# Patient Record
Sex: Female | Born: 1935 | Race: White | Hispanic: No | Smoking: Never smoker
Health system: Southern US, Community
[De-identification: ages and names within clinical notes are randomized; demographics above are authoritative.]

## PROBLEM LIST (undated history)

## (undated) DIAGNOSIS — F329 Major depressive disorder, single episode, unspecified: Secondary | ICD-10-CM

## (undated) DIAGNOSIS — I619 Nontraumatic intracerebral hemorrhage, unspecified: Secondary | ICD-10-CM

## (undated) DIAGNOSIS — I10 Essential (primary) hypertension: Secondary | ICD-10-CM

## (undated) DIAGNOSIS — I669 Occlusion and stenosis of unspecified cerebral artery: Secondary | ICD-10-CM

## (undated) DIAGNOSIS — I2510 Atherosclerotic heart disease of native coronary artery without angina pectoris: Secondary | ICD-10-CM

## (undated) DIAGNOSIS — F419 Anxiety disorder, unspecified: Secondary | ICD-10-CM

## (undated) DIAGNOSIS — F32A Depression, unspecified: Secondary | ICD-10-CM

## (undated) DIAGNOSIS — I639 Cerebral infarction, unspecified: Secondary | ICD-10-CM

## (undated) DIAGNOSIS — I340 Nonrheumatic mitral (valve) insufficiency: Secondary | ICD-10-CM

## (undated) HISTORY — DX: Cerebral infarction, unspecified: I63.9

## (undated) HISTORY — DX: Major depressive disorder, single episode, unspecified: F32.9

## (undated) HISTORY — DX: Occlusion and stenosis of unspecified cerebral artery: I66.9

## (undated) HISTORY — PX: ABDOMINAL HYSTERECTOMY: SHX81

## (undated) HISTORY — PX: APPENDECTOMY: SHX54

## (undated) HISTORY — PX: CHOLECYSTECTOMY: SHX55

## (undated) HISTORY — PX: COLON SURGERY: SHX602

## (undated) HISTORY — DX: Nontraumatic intracerebral hemorrhage, unspecified: I61.9

## (undated) HISTORY — DX: Anxiety disorder, unspecified: F41.9

## (undated) HISTORY — DX: Depression, unspecified: F32.A

## (undated) HISTORY — DX: Essential (primary) hypertension: I10

---

## 1999-07-01 DIAGNOSIS — I639 Cerebral infarction, unspecified: Secondary | ICD-10-CM

## 1999-07-01 HISTORY — DX: Cerebral infarction, unspecified: I63.9

## 2008-11-10 ENCOUNTER — Emergency Department: Payer: Self-pay | Admitting: Emergency Medicine

## 2010-12-22 ENCOUNTER — Emergency Department: Payer: Self-pay | Admitting: Internal Medicine

## 2014-10-23 ENCOUNTER — Emergency Department
Admit: 2014-10-23 | Disposition: A | Discharge status: Home / Self Care | Payer: Self-pay | Admitting: Emergency Medicine

## 2014-10-23 DIAGNOSIS — Z79899 Other long term (current) drug therapy: Secondary | ICD-10-CM | POA: Diagnosis not present

## 2014-10-23 DIAGNOSIS — K0500 Acute gingivitis, plaque induced: Secondary | ICD-10-CM | POA: Diagnosis not present

## 2014-10-23 DIAGNOSIS — K0510 Chronic gingivitis, plaque induced: Secondary | ICD-10-CM | POA: Diagnosis not present

## 2014-10-23 DIAGNOSIS — Z7982 Long term (current) use of aspirin: Secondary | ICD-10-CM | POA: Diagnosis not present

## 2014-10-23 DIAGNOSIS — R6884 Jaw pain: Secondary | ICD-10-CM | POA: Diagnosis not present

## 2014-10-23 DIAGNOSIS — I10 Essential (primary) hypertension: Secondary | ICD-10-CM | POA: Diagnosis not present

## 2014-10-23 DIAGNOSIS — K088 Other specified disorders of teeth and supporting structures: Secondary | ICD-10-CM | POA: Diagnosis not present

## 2014-10-23 DIAGNOSIS — K029 Dental caries, unspecified: Secondary | ICD-10-CM | POA: Diagnosis not present

## 2014-10-23 LAB — CBC WITH DIFFERENTIAL/PLATELET
BASOS ABS: 0 10*3/uL (ref 0.0–0.1)
Basophil %: 0.5 %
EOS ABS: 0 10*3/uL (ref 0.0–0.7)
Eosinophil %: 0.4 %
HCT: 41.5 % (ref 35.0–47.0)
HGB: 14.1 g/dL (ref 12.0–16.0)
Lymphocyte #: 3.1 10*3/uL (ref 1.0–3.6)
Lymphocyte %: 32.6 %
MCH: 31.8 pg (ref 26.0–34.0)
MCHC: 34.1 g/dL (ref 32.0–36.0)
MCV: 93 fL (ref 80–100)
Monocyte #: 0.7 x10 3/mm (ref 0.2–0.9)
Monocyte %: 7.2 %
NEUTROS ABS: 5.6 10*3/uL (ref 1.4–6.5)
NEUTROS PCT: 59.3 %
PLATELETS: 240 10*3/uL (ref 150–440)
RBC: 4.45 10*6/uL (ref 3.80–5.20)
RDW: 13.2 % (ref 11.5–14.5)
WBC: 9.4 10*3/uL (ref 3.6–11.0)

## 2014-11-24 DIAGNOSIS — I10 Essential (primary) hypertension: Secondary | ICD-10-CM | POA: Diagnosis not present

## 2014-11-24 DIAGNOSIS — I639 Cerebral infarction, unspecified: Secondary | ICD-10-CM | POA: Diagnosis not present

## 2015-07-29 ENCOUNTER — Other Ambulatory Visit: Payer: Self-pay | Admitting: Unknown Physician Specialty

## 2015-07-30 NOTE — Telephone Encounter (Signed)
Pt needs check further refills 

## 2015-08-15 ENCOUNTER — Other Ambulatory Visit: Payer: Self-pay | Admitting: Unknown Physician Specialty

## 2015-08-15 NOTE — Telephone Encounter (Signed)
Pt needs check further refills 

## 2015-08-21 NOTE — Telephone Encounter (Signed)
Called pt the number on file in [facility] has been disconnected, the number on file in [facility] works, left message on voicemail to call and schedule a follow up appt ASAP. Thanks.

## 2015-08-22 NOTE — Telephone Encounter (Signed)
Called the number from [facility] and left a voicemail asking for patient to please return my call.

## 2015-08-24 NOTE — Telephone Encounter (Signed)
Called and left patient a voicemail asking for her to please return my call. This was the 3rd attempt at reaching the patient so I will send her a letter.  

## 2015-10-28 ENCOUNTER — Other Ambulatory Visit: Payer: Self-pay | Admitting: Unknown Physician Specialty

## 2015-11-07 ENCOUNTER — Other Ambulatory Visit: Payer: Self-pay | Admitting: Unknown Physician Specialty

## 2015-11-07 MED ORDER — FLUOXETINE HCL 20 MG PO CAPS: 20.0000 mg | ORAL_CAPSULE | Freq: Every day | ORAL | Status: DC

## 2015-11-07 MED ORDER — HYDROCHLOROTHIAZIDE 25 MG PO TABS: 25.0000 mg | ORAL_TABLET | Freq: Every day | ORAL | Status: DC

## 2015-11-07 NOTE — Telephone Encounter (Signed)
Pt's daughter called stated her mother is completely out of medication and the pharmacy said pt needs an appt before pt can receive a refill. Pt has not been seen in almost a year. Daughter stated she has no way to get mother in for an appointment as her car is in the shop. Please contact pt's daughter. Thanks.

## 2015-11-07 NOTE — Telephone Encounter (Signed)
Called and spoke to patient's daughter. She stated that her mother was completely out of medications. I asked her if her car would be ready within the next month and she stated it should be so I scheduled the patient an appointment for 12/03/15. Elnita MaxwellCheryl, can we send in enough medicine to get to that appointment.

## 2015-11-07 NOTE — Telephone Encounter (Signed)
done

## 2015-11-08 DIAGNOSIS — F419 Anxiety disorder, unspecified: Secondary | ICD-10-CM | POA: Insufficient documentation

## 2015-11-08 DIAGNOSIS — F32A Depression, unspecified: Secondary | ICD-10-CM | POA: Insufficient documentation

## 2015-11-08 DIAGNOSIS — I10 Essential (primary) hypertension: Secondary | ICD-10-CM | POA: Insufficient documentation

## 2015-11-08 DIAGNOSIS — F329 Major depressive disorder, single episode, unspecified: Secondary | ICD-10-CM | POA: Insufficient documentation

## 2015-12-03 ENCOUNTER — Ambulatory Visit: Payer: Self-pay | Admitting: Unknown Physician Specialty

## 2016-01-06 ENCOUNTER — Other Ambulatory Visit: Payer: Self-pay | Admitting: Unknown Physician Specialty

## 2016-01-18 ENCOUNTER — Other Ambulatory Visit: Payer: Self-pay

## 2016-01-18 MED ORDER — FLUOXETINE HCL 20 MG PO CAPS: 20.0000 mg | ORAL_CAPSULE | Freq: Every day | ORAL | Status: DC

## 2016-01-18 MED ORDER — HYDROCHLOROTHIAZIDE 25 MG PO TABS: 25.0000 mg | ORAL_TABLET | Freq: Every day | ORAL | Status: DC

## 2016-03-07 ENCOUNTER — Emergency Department: Payer: Medicare Other

## 2016-03-07 ENCOUNTER — Inpatient Hospital Stay
Admission: EM | Admit: 2016-03-07 | Discharge: 2016-03-11 | DRG: 065 | Disposition: A | Discharge status: Skilled Nursing Facility | Payer: Medicare Other | Attending: Internal Medicine | Admitting: Internal Medicine

## 2016-03-07 DIAGNOSIS — I639 Cerebral infarction, unspecified: Principal | ICD-10-CM | POA: Diagnosis present

## 2016-03-07 DIAGNOSIS — Z7401 Bed confinement status: Secondary | ICD-10-CM | POA: Diagnosis not present

## 2016-03-07 DIAGNOSIS — Z23 Encounter for immunization: Secondary | ICD-10-CM | POA: Diagnosis not present

## 2016-03-07 DIAGNOSIS — I6602 Occlusion and stenosis of left middle cerebral artery: Secondary | ICD-10-CM | POA: Diagnosis not present

## 2016-03-07 DIAGNOSIS — B9620 Unspecified Escherichia coli [E. coli] as the cause of diseases classified elsewhere: Secondary | ICD-10-CM | POA: Diagnosis present

## 2016-03-07 DIAGNOSIS — I679 Cerebrovascular disease, unspecified: Secondary | ICD-10-CM | POA: Diagnosis not present

## 2016-03-07 DIAGNOSIS — I69351 Hemiplegia and hemiparesis following cerebral infarction affecting right dominant side: Secondary | ICD-10-CM | POA: Diagnosis not present

## 2016-03-07 DIAGNOSIS — M6281 Muscle weakness (generalized): Secondary | ICD-10-CM | POA: Diagnosis not present

## 2016-03-07 DIAGNOSIS — Z741 Need for assistance with personal care: Secondary | ICD-10-CM | POA: Diagnosis not present

## 2016-03-07 DIAGNOSIS — R262 Difficulty in walking, not elsewhere classified: Secondary | ICD-10-CM | POA: Diagnosis not present

## 2016-03-07 DIAGNOSIS — G8321 Monoplegia of upper limb affecting right dominant side: Secondary | ICD-10-CM | POA: Diagnosis present

## 2016-03-07 DIAGNOSIS — E785 Hyperlipidemia, unspecified: Secondary | ICD-10-CM | POA: Diagnosis present

## 2016-03-07 DIAGNOSIS — I340 Nonrheumatic mitral (valve) insufficiency: Secondary | ICD-10-CM | POA: Diagnosis not present

## 2016-03-07 DIAGNOSIS — I2510 Atherosclerotic heart disease of native coronary artery without angina pectoris: Secondary | ICD-10-CM | POA: Diagnosis not present

## 2016-03-07 DIAGNOSIS — N390 Urinary tract infection, site not specified: Secondary | ICD-10-CM | POA: Diagnosis not present

## 2016-03-07 DIAGNOSIS — E876 Hypokalemia: Secondary | ICD-10-CM | POA: Diagnosis not present

## 2016-03-07 DIAGNOSIS — Z7982 Long term (current) use of aspirin: Secondary | ICD-10-CM

## 2016-03-07 DIAGNOSIS — R29706 NIHSS score 6: Secondary | ICD-10-CM | POA: Diagnosis not present

## 2016-03-07 DIAGNOSIS — I63032 Cerebral infarction due to thrombosis of left carotid artery: Secondary | ICD-10-CM | POA: Diagnosis not present

## 2016-03-07 DIAGNOSIS — I6350 Cerebral infarction due to unspecified occlusion or stenosis of unspecified cerebral artery: Secondary | ICD-10-CM | POA: Diagnosis not present

## 2016-03-07 DIAGNOSIS — Z8249 Family history of ischemic heart disease and other diseases of the circulatory system: Secondary | ICD-10-CM

## 2016-03-07 DIAGNOSIS — R1312 Dysphagia, oropharyngeal phase: Secondary | ICD-10-CM | POA: Diagnosis not present

## 2016-03-07 DIAGNOSIS — R279 Unspecified lack of coordination: Secondary | ICD-10-CM | POA: Diagnosis not present

## 2016-03-07 DIAGNOSIS — R4701 Aphasia: Secondary | ICD-10-CM | POA: Diagnosis not present

## 2016-03-07 DIAGNOSIS — I10 Essential (primary) hypertension: Secondary | ICD-10-CM | POA: Diagnosis not present

## 2016-03-07 DIAGNOSIS — I63031 Cerebral infarction due to thrombosis of right carotid artery: Secondary | ICD-10-CM | POA: Diagnosis not present

## 2016-03-07 DIAGNOSIS — R29818 Other symptoms and signs involving the nervous system: Secondary | ICD-10-CM | POA: Diagnosis not present

## 2016-03-07 DIAGNOSIS — Z823 Family history of stroke: Secondary | ICD-10-CM | POA: Diagnosis not present

## 2016-03-07 DIAGNOSIS — R471 Dysarthria and anarthria: Secondary | ICD-10-CM | POA: Diagnosis not present

## 2016-03-07 DIAGNOSIS — R531 Weakness: Secondary | ICD-10-CM | POA: Diagnosis not present

## 2016-03-07 HISTORY — DX: Nonrheumatic mitral (valve) insufficiency: I34.0

## 2016-03-07 LAB — CBC
HEMATOCRIT: 41.6 % (ref 35.0–47.0)
HEMOGLOBIN: 14.6 g/dL (ref 12.0–16.0)
MCH: 31.8 pg (ref 26.0–34.0)
MCHC: 35.1 g/dL (ref 32.0–36.0)
MCV: 90.6 fL (ref 80.0–100.0)
Platelets: 246 10*3/uL (ref 150–440)
RBC: 4.59 MIL/uL (ref 3.80–5.20)
RDW: 13.2 % (ref 11.5–14.5)
WBC: 10.6 10*3/uL (ref 3.6–11.0)

## 2016-03-07 LAB — COMPREHENSIVE METABOLIC PANEL
ALK PHOS: 73 U/L (ref 38–126)
ALT: 25 U/L (ref 14–54)
AST: 33 U/L (ref 15–41)
Albumin: 4.3 g/dL (ref 3.5–5.0)
Anion gap: 11 (ref 5–15)
BILIRUBIN TOTAL: 1.1 mg/dL (ref 0.3–1.2)
BUN: 19 mg/dL (ref 6–20)
CALCIUM: 9.3 mg/dL (ref 8.9–10.3)
CO2: 27 mmol/L (ref 22–32)
CREATININE: 0.78 mg/dL (ref 0.44–1.00)
Chloride: 102 mmol/L (ref 101–111)
GFR calc Af Amer: >60 mL/min (ref >60)
Glucose, Bld: 101 mg/dL — ABNORMAL HIGH (ref 65–99)
Potassium: 3.1 mmol/L — ABNORMAL LOW (ref 3.5–5.1)
Sodium: 140 mmol/L (ref 135–145)
TOTAL PROTEIN: 7.7 g/dL (ref 6.5–8.1)

## 2016-03-07 LAB — URINALYSIS COMPLETE WITH MICROSCOPIC (ARMC ONLY)
Bilirubin Urine: NEGATIVE
Glucose, UA: NEGATIVE mg/dL
Hgb urine dipstick: NEGATIVE
Nitrite: NEGATIVE
PROTEIN: NEGATIVE mg/dL
Specific Gravity, Urine: 1.015 (ref 1.005–1.030)
Squamous Epithelial / LPF: NONE SEEN
pH: 6 (ref 5.0–8.0)

## 2016-03-07 LAB — PROTIME-INR
INR: 0.98
Prothrombin Time: 13 seconds (ref 11.4–15.2)

## 2016-03-07 LAB — DIFFERENTIAL
BASOS ABS: 0.1 10*3/uL (ref 0–0.1)
Basophils Relative: 1 %
EOS ABS: 0.1 10*3/uL (ref 0–0.7)
Eosinophils Relative: 1 %
LYMPHS ABS: 4.8 10*3/uL — AB (ref 1.0–3.6)
Lymphocytes Relative: 45 %
MONOS PCT: 6 %
Monocytes Absolute: 0.6 10*3/uL (ref 0.2–0.9)
NEUTROS ABS: 5.1 10*3/uL (ref 1.4–6.5)
Neutrophils Relative %: 47 %

## 2016-03-07 LAB — GLUCOSE, CAPILLARY: Glucose-Capillary: 104 mg/dL — ABNORMAL HIGH (ref 65–99)

## 2016-03-07 LAB — APTT: APTT: 33 s (ref 24–36)

## 2016-03-07 LAB — TROPONIN I

## 2016-03-07 MED ORDER — POTASSIUM CHLORIDE 20 MEQ PO PACK
20.0000 meq | PACK | Freq: Once | ORAL | Status: AC
  Administered 2016-03-09: 10:00:00 20 meq via ORAL
  Filled 2016-03-07 (×2): qty 1

## 2016-03-07 MED ORDER — CLOPIDOGREL BISULFATE 75 MG PO TABS
75.0000 mg | ORAL_TABLET | Freq: Every day | ORAL | Status: DC
  Administered 2016-03-07 – 2016-03-11 (×5): 75 mg via ORAL
  Filled 2016-03-07 (×4): qty 1

## 2016-03-07 MED ORDER — CLOPIDOGREL BISULFATE 75 MG PO TABS
ORAL_TABLET | ORAL | Status: AC
  Filled 2016-03-07: qty 1

## 2016-03-07 MED ORDER — ATORVASTATIN CALCIUM 20 MG PO TABS
40.0000 mg | ORAL_TABLET | Freq: Every day | ORAL | Status: DC
  Administered 2016-03-08 – 2016-03-10 (×3): 40 mg via ORAL
  Filled 2016-03-07 (×3): qty 2

## 2016-03-07 MED ORDER — ACETAMINOPHEN 650 MG RE SUPP: 650.0000 mg | Freq: Four times a day (QID) | RECTAL | Status: DC | PRN

## 2016-03-07 MED ORDER — FLUOXETINE HCL 20 MG PO CAPS
20.0000 mg | ORAL_CAPSULE | Freq: Every day | ORAL | Status: DC
Start: 2016-03-08 — End: 2016-03-11
  Administered 2016-03-08 – 2016-03-11 (×4): 20 mg via ORAL
  Filled 2016-03-07 (×4): qty 1

## 2016-03-07 MED ORDER — ASPIRIN 81 MG PO CHEW
324.0000 mg | CHEWABLE_TABLET | Freq: Once | ORAL | Status: AC
  Administered 2016-03-07: 324 mg via ORAL
  Filled 2016-03-07: qty 4

## 2016-03-07 MED ORDER — ACETAMINOPHEN 325 MG PO TABS: 650.0000 mg | ORAL_TABLET | Freq: Four times a day (QID) | ORAL | Status: DC | PRN

## 2016-03-07 MED ORDER — ENOXAPARIN SODIUM 40 MG/0.4ML ~~LOC~~ SOLN
40.0000 mg | SUBCUTANEOUS | Status: DC
  Administered 2016-03-08 – 2016-03-10 (×4): 40 mg via SUBCUTANEOUS
  Filled 2016-03-07 (×4): qty 0.4

## 2016-03-07 NOTE — ED Triage Notes (Signed)
Pt came to ED via pov. Per family, pt last known well around 1030. Pt napped and woke up feeling weak. Pt has right sided deficits from previous stroke. Per family, right side is worse today. Pt having trouble walking and has slurred speech. No facial droop noted.

## 2016-03-07 NOTE — ED Notes (Signed)
CBG 104 

## 2016-03-07 NOTE — Consult Note (Signed)
Chaplain paged for Code Stroke; provide pastoral presence and support to patient, daughter while awaiting testing and information on POC.

## 2016-03-07 NOTE — H&P (Signed)
[facility] [place] [facility]   PATIENT NAME: Alexandria Hanson    MR#:  [id number]  DATE OF BIRTH:  [birth year]  DATE OF ADMISSION:  03/07/2016  PRIMARY CARE PHYSICIAN: Gabriel Cirriheryl Wicker, NP   REQUESTING/REFERRING PHYSICIAN:   CHIEF COMPLAINT:   Chief Complaint  Patient presents with  . Code Stroke    HISTORY OF PRESENT ILLNESS:  Alexandria Hanson  is a 82 y.o. female. At 10:30 AM patient had a fall. Later when they checked at her she had no mobility in her right arm and her speech was slurred. She knows what she wants to say but unable to say it. Last week she did have a headache for 3 days. Code stroke was called. A she was not in the TPA window. She does take aspirin at home. Hospitalists were contacted for admission. Patient has a history of stroke in the past with baseline right-sided weakness. Patient unable to talk in complete sentences clearly. She is able to say yes or no. History obtained from family at the bedside.  PAST MEDICAL HISTORY:   Past Medical History:  Diagnosis Date  . MI (mitral incompetence)   . Stroke [facility](HCC)     PAST SURGICAL HISTORY:   Past Surgical History:  Procedure Laterality Date  . ABDOMINAL HYSTERECTOMY    . APPENDECTOMY    . CHOLECYSTECTOMY      SOCIAL HISTORY:   Social History  Substance Use Topics  . Smoking status: Never Smoker  . Smokeless tobacco: Never Used  . Alcohol use No    FAMILY HISTORY:   Family History  Problem Relation Age of Onset  . CVA Mother   . CAD Father   . CAD Sister     DRUG ALLERGIES:  No Known Allergies  REVIEW OF SYSTEMS:  CONSTITUTIONAL: No fever, Positive for right-sided weakness  EYES: No blurred or double vision.  EARS, NOSE, AND THROAT: No tinnitus or ear pain. No sore throat RESPIRATORY: No cough, shortness of breath, wheezing or hemoptysis.  CARDIOVASCULAR: No chest pain, orthopnea, edema.  GASTROINTESTINAL: No nausea, vomiting, diarrhea or abdominal pain. No blood  in bowel movements GENITOURINARY: No dysuria, hematuria.  ENDOCRINE: No polyuria, nocturia,  HEMATOLOGY: No anemia, easy bruising or bleeding SKIN: No rash or lesion. MUSCULOSKELETAL: No joint pain or arthritis.   NEUROLOGIC: No tingling, numbness, weakness.  PSYCHIATRY: No anxiety or depression.   MEDICATIONS AT HOME:   Prior to Admission medications   Medication Sig Start Date End Date Taking? Authorizing Provider  aspirin 81 MG chewable tablet Chew 81 mg by mouth daily.   Yes Historical Provider, MD  FLUoxetine (PROZAC) 20 MG capsule Take 1 capsule by mouth daily. 02/15/16  Yes Historical Provider, MD  hydrochlorothiazide (HYDRODIURIL) 25 MG tablet Take 1 tablet by mouth daily. 02/15/16  Yes Historical Provider, MD  Multiple Vitamin (MULTIVITAMIN) tablet Take 1 tablet by mouth daily.   Yes Historical Provider, MD      VITAL SIGNS:  Blood pressure (!) 151/63, pulse 81, temperature 98.7 F (37.1 C), temperature source Oral, resp. rate 19, height 5\' 5"  (1.651 m), weight 54.4 kg (120 lb), SpO2 97 %.  PHYSICAL EXAMINATION:  GENERAL:  [age] y.o.-year-old patient lying in the bed with no acute distress.  EYES: Pupils equal, round, reactive to light and accommodation. No scleral icterus. Extraocular muscles intact.  HEENT: Head atraumatic, normocephalic. Oropharynx and nasopharynx clear.  NECK:  Supple, no jugular venous distention. No thyroid enlargement, no tenderness.  LUNGS: Normal breath sounds  bilaterally, no wheezing, rales,rhonchi or crepitation. No use of accessory muscles of respiration.  CARDIOVASCULAR: S1, S2 normal. No murmurs, rubs, or gallops.  ABDOMEN: Soft, nontender, nondistended. Bowel sounds present. No organomegaly or mass.  EXTREMITIES: No pedal edema, cyanosis, or clubbing.  NEUROLOGIC: Cranial nerves II through XII are intact. Patient only able to extend fingers on the right side. Slight flexion out of the elbow. Able to straight leg raise bilateral lower extremities. A  little weaker on the right lower extremity than the left. PSYCHIATRIC: The patient is alert.  SKIN: No rash, lesion, or ulcer.   LABORATORY PANEL:   CBC  Recent Labs Lab 03/07/16 1810  WBC 10.6  HGB 14.6  HCT 41.6  PLT 246   ------------------------------------------------------------------------------------------------------------------  Chemistries   Recent Labs Lab 03/07/16 1810  NA 140  K 3.1*  CL 102  CO2 27  GLUCOSE 101*  BUN 19  CREATININE 0.78  CALCIUM 9.3  AST 33  ALT 25  ALKPHOS 73  BILITOT 1.1   ------------------------------------------------------------------------------------------------------------------  Cardiac Enzymes  Recent Labs Lab 03/07/16 1810  TROPONINI <0.03   ------------------------------------------------------------------------------------------------------------------  RADIOLOGY:  Ct Head Code Stroke W/o Cm  Result Date: 03/07/2016 CLINICAL DATA:  Code stroke. Acute onset RIGHT-sided weakness and speech difficulty. History of LEFT subdural hematoma. EXAM: CT HEAD WITHOUT CONTRAST TECHNIQUE: Contiguous axial images were obtained from the base of the skull through the vertex without intravenous contrast. COMPARISON:  CT HEAD December 22, 2010 FINDINGS: BRAIN: No intraparenchymal hemorrhage, mass effect, midline shift or acute large vascular territory infarct. Old LEFT basal ganglia infarct with ex vacuo dilatation LEFT lateral ventricle. Smaller LEFT cerebral peduncle compatible with wallerian degeneration. Old small RIGHT cerebellar infarcts. Stable LEFT posterior fossa arachnoid cyst with mild mass effect. Resolution of LEFT subdural hemorrhage. VASCULAR: Moderate to severe calcific atherosclerosis of the carotid siphons. SKULL: No skull fracture. No significant scalp soft tissue swelling. Hyperostosis frontalis internus. Multiple calcified scalp sebaceous cysts. SINUSES/ORBITS: The included ocular globes and orbital contents are  non-suspicious.The mastoid air-cells and included paranasal sinuses are well-aerated. OTHER: None. ASPECTS [facility] Stroke Program Early CT Score) - Ganglionic level infarction (caudate, lentiform nuclei, internal capsule, insula, M1-M3 cortex): 7 - Supraganglionic infarction (M4-M6 cortex): 3 Total score (0-10 with 10 being normal): 10 IMPRESSION: 1. No acute intracranial process. Old LEFT basal ganglia infarct and moderate chronic small vessel ischemic disease. 2. ASPECTS is 10. Acute findings discussed with and reconfirmed by Dr.PHILLIP STAFFORD on 03/07/2016 at 6:24 pm. Electronically Signed   By: Awilda Metro M.D.   On: 03/07/2016 18:26    EKG:   Normal sinus rhythm right bundle branch block, left anterior fascicular block, LVH  IMPRESSION AND PLAN:   1. Acute stroke with right-sided weakness. Take a step up with Plavix. Add statin. Check a lipid profile in the a.m. Physical therapy, occupational therapy and speech therapy consultations. 2. Essential hypertension hold hydrochlorothiazide for right now. 3. History of CAD 4. Hypokalemia replace potassium orally   All the records are reviewed and case discussed with ED provider. Management plans discussed with the patient, family and they are in agreement.  CODE STATUS: DO NOT RESUSCITATE  TOTAL TIME TAKING CARE OF THIS PATIENT: 50 minutes.    Alford Highland M.D on 03/07/2016 at 9:40 PM  Between 7am to 6pm - Pager - [phone]  After 6pm call admission pager [phone]  [facility] Office  [phone]  CC: Primary care physician; Gabriel Cirri, NP

## 2016-03-07 NOTE — ED Notes (Signed)
CODE STROKE CALLED TO [phone] 

## 2016-03-07 NOTE — ED Provider Notes (Signed)
[facility] Emergency Department Provider Note  ____________________________________________  Time seen: Approximately 7:27 PM  I have reviewed the triage vital signs and the nursing notes.   HISTORY  Chief Complaint Code Stroke   HPI Alexandria Hanson is a 82 y.o. female history of CVA on aspirin who presents for evaluation of expressive aphasia and worsening right-sided weakness. Patient was made a code stroke upon arrival. Patient was last seen normal at 10:30 AM. Around 2:00 she had a fall in her room which prompted one of her family members to go check on her. She was found to have worsening weakness on the right side and was unable to stand up on her own. She was also found to have an expressive a fusion which is new for her. Patient denies headache, chest pain, shortness of breath, nausea, vomiting, dysuria, fever. Patient is able to answer questions yes or no and follows all commands however is unable to express herself.  Past Medical History:  Diagnosis Date  . MI (mitral incompetence)   . Stroke [facility])     There are no active problems to display for this patient.   Past Surgical History:  Procedure Laterality Date  . CHOLECYSTECTOMY      Prior to Admission medications   Not on File    Allergies Review of patient's allergies indicates no known allergies.  No family history on file.  Social History Social History  Substance Use Topics  . Smoking status: Never Smoker  . Smokeless tobacco: Never Used  . Alcohol use No    Review of Systems  Constitutional: Negative for fever. Eyes: Negative for visual changes. ENT: Negative for sore throat. Cardiovascular: Negative for chest pain. Respiratory: Negative for shortness of breath. Gastrointestinal: Negative for abdominal pain, vomiting or diarrhea. Genitourinary: Negative for dysuria. Musculoskeletal: Negative for back pain. Skin: Negative for rash. Neurological: Negative for  headaches. + R sided weakness and expressive aphasia  ____________________________________________   PHYSICAL EXAM:  VITAL SIGNS: ED Triage Vitals  Enc Vitals Group     BP 03/07/16 1802 (!) 172/86     Pulse Rate 03/07/16 1802 (!) 117     Resp 03/07/16 1802 (!) 22     Temp 03/07/16 1802 97.5 F (36.4 C)     Temp Source 03/07/16 1802 Oral     SpO2 03/07/16 1802 93 %     Weight 03/07/16 1841 120 lb (54.4 kg)     Height 03/07/16 1841 5\' 5"  (1.651 m)     Head Circumference --      Peak Flow --      Pain Score --      Pain Loc --      Pain Edu? --      Excl. in GC? --     Constitutional: Alert and oriented. Well appearing and in no apparent distress. HEENT:      Head: Normocephalic and atraumatic.         Eyes: Conjunctivae are normal. Sclera is non-icteric. EOMI. PERRL      Mouth/Throat: Mucous membranes are moist.       Neck: Supple with no signs of meningismus. Cardiovascular: Regular rate and rhythm. No murmurs, gallops, or rubs. 2+ symmetrical distal pulses are present in all extremities. No JVD. Respiratory: Normal respiratory effort. Lungs are clear to auscultation bilaterally. No wheezes, crackles, or rhonchi.  Gastrointestinal: Soft, non tender, and non distended with positive bowel sounds. No rebound or guarding. Musculoskeletal: Nontender with normal range of motion  in all extremities. No edema, cyanosis, or erythema of extremities. Neurologic: Expressive aphasia, follows commands and understands everything I tell her. She has 2/5 strength on RUE, normal strength in all other extremities, slight facial droop on the right Skin: Skin is warm, dry and intact. No rash noted. Psychiatric: Mood and affect are normal. Speech and behavior are normal.  ____________________________________________   LABS (all labs ordered are listed, but only abnormal results are displayed)  Labs Reviewed  DIFFERENTIAL - Abnormal; Notable for the following:       Result Value   Lymphs Abs  4.8 (*)    All other components within normal limits  COMPREHENSIVE METABOLIC PANEL - Abnormal; Notable for the following:    Potassium 3.1 (*)    Glucose, Bld 101 (*)    All other components within normal limits  GLUCOSE, CAPILLARY - Abnormal; Notable for the following:    Glucose-Capillary 104 (*)    All other components within normal limits  PROTIME-INR  APTT  CBC  TROPONIN I  URINALYSIS COMPLETEWITH MICROSCOPIC ([facility] ONLY)  CBG MONITORING, ED   ____________________________________________  EKG  ED ECG REPORT I, Alexandria Hanson, the attending physician, personally viewed and interpreted this ECG.  Normal sinus rhythm, rate of 92, first-degree AV block, prolonged QTC, RBBB, no ST elevations or depressions, left axis deviation. No prior for comparison ____________________________________________  RADIOLOGY  Head CT: No acute stroke  ____________________________________________   PROCEDURES  Procedure(s) performed: None Procedures Critical Care performed:  None ____________________________________________   INITIAL IMPRESSION / ASSESSMENT AND PLAN / ED COURSE  81 y.o. female history of CVA on aspirin who presents for evaluation of expressive aphasia and worsening right-sided weakness. Last seen normal at 10:30 AM. Patient with 2/5 weakness on the right upper extremity, slight facial droop on the right, and expressive aphasia which are all new. Presentation concerning for stroke. CT head negative. Patient is outside TPA window. Consult tele neurology has been placed. Labs are pending. Patient will be given full dose of aspirin.  NIH Stroke Scale  Interval: Baseline Time: Arrival Person Administering Scale: [place]  Administer stroke scale items in the order listed. Record performance in each category after each subscale exam. Do not go back and change scores. Follow directions provided for each exam technique. Scores should reflect what the patient does,  not what the clinician thinks the patient can do. The clinician should record answers while administering the exam and work quickly. Except where indicated, the patient should not be coached (i.e., repeated requests to patient to make a special effort).   1a  Level of consciousness: 0=alert; keenly responsive  1b. LOC questions:  0=Performs both tasks correctly  1c. LOC commands: 0=Performs both tasks correctly  2.  Best Gaze: 0=normal  3.  Visual: 0=No visual loss  4. Facial Palsy: 1=Minor paralysis (flattened nasolabial fold, asymmetric on smiling)  5a.  Motor left arm: 0=No drift, limb holds 90 (or 45) degrees for full 10 seconds  5b.  Motor right arm: 3=No effort against gravity, limb falls  6a. motor left leg: 0=No drift, limb holds 90 (or 45) degrees for full 10 seconds  6b  Motor right leg:  0=No drift, limb holds 90 (or 45) degrees for full 10 seconds  7. Limb Ataxia: 0=Absent  8.  Sensory: 0=Normal; no sensory loss  9. Best Language:  1=Mild to moderate aphasia; some obvious loss of fluency or facility of comprehension without significant limitation on ideas expressed or form of expression.  10.  Dysarthria: 1=Mild to moderate, patient slurs at least some words and at worst, can be understood with some difficulty  11. Extinction and Inattention: 0=No abnormality  12. Distal motor function: 0=Normal   Total:   6     Clinical Course  Comment By Time  Spoke with Dr. Carlyn ReichertKatzin Pella neurology who recommended starting patient on Plavix and admit as is concerned the patient has had a new stroke. Patient is outside the window for TPA. Family has been updated. Patient has received a full dose of aspirin. I have discussed with the hospitalist will admit patient. Alexandria Sicklearolina Kameah Rawl, MD 09/08 [phone]    Pertinent labs & imaging results that were available during my care of the patient were reviewed by me and considered in my medical decision making (see chart for  details).    ____________________________________________   FINAL CLINICAL IMPRESSION(S) / ED DIAGNOSES  Final diagnoses:  Cerebrovascular accident (CVA), unspecified mechanism (HCC)      NEW MEDICATIONS STARTED DURING THIS VISIT:  New Prescriptions   No medications on file     Note:  This document was prepared using Dragon voice recognition software and may include unintentional dictation errors.    Alexandria Sicklearolina Lam Mccubbins, MD 03/07/16 [phone]

## 2016-03-08 ENCOUNTER — Inpatient Hospital Stay: Payer: Medicare Other

## 2016-03-08 ENCOUNTER — Encounter: Payer: Self-pay | Admitting: *Deleted

## 2016-03-08 ENCOUNTER — Inpatient Hospital Stay (HOSPITAL_COMMUNITY)
Admit: 2016-03-08 | Discharge: 2016-03-08 | Disposition: A | Discharge status: Home / Self Care | Payer: Medicare Other | Attending: Internal Medicine | Admitting: Internal Medicine

## 2016-03-08 DIAGNOSIS — E785 Hyperlipidemia, unspecified: Secondary | ICD-10-CM | POA: Diagnosis present

## 2016-03-08 DIAGNOSIS — N390 Urinary tract infection, site not specified: Secondary | ICD-10-CM | POA: Diagnosis present

## 2016-03-08 DIAGNOSIS — Z8249 Family history of ischemic heart disease and other diseases of the circulatory system: Secondary | ICD-10-CM | POA: Diagnosis not present

## 2016-03-08 DIAGNOSIS — I6350 Cerebral infarction due to unspecified occlusion or stenosis of unspecified cerebral artery: Secondary | ICD-10-CM

## 2016-03-08 DIAGNOSIS — Z823 Family history of stroke: Secondary | ICD-10-CM | POA: Diagnosis not present

## 2016-03-08 DIAGNOSIS — I10 Essential (primary) hypertension: Secondary | ICD-10-CM

## 2016-03-08 DIAGNOSIS — I2510 Atherosclerotic heart disease of native coronary artery without angina pectoris: Secondary | ICD-10-CM | POA: Diagnosis present

## 2016-03-08 DIAGNOSIS — Z23 Encounter for immunization: Secondary | ICD-10-CM | POA: Diagnosis not present

## 2016-03-08 DIAGNOSIS — Z7982 Long term (current) use of aspirin: Secondary | ICD-10-CM | POA: Diagnosis not present

## 2016-03-08 DIAGNOSIS — E876 Hypokalemia: Secondary | ICD-10-CM | POA: Diagnosis present

## 2016-03-08 DIAGNOSIS — G8321 Monoplegia of upper limb affecting right dominant side: Secondary | ICD-10-CM | POA: Diagnosis present

## 2016-03-08 DIAGNOSIS — B9620 Unspecified Escherichia coli [E. coli] as the cause of diseases classified elsewhere: Secondary | ICD-10-CM | POA: Diagnosis present

## 2016-03-08 DIAGNOSIS — I639 Cerebral infarction, unspecified: Secondary | ICD-10-CM | POA: Diagnosis present

## 2016-03-08 DIAGNOSIS — R4701 Aphasia: Secondary | ICD-10-CM | POA: Diagnosis present

## 2016-03-08 DIAGNOSIS — I340 Nonrheumatic mitral (valve) insufficiency: Secondary | ICD-10-CM | POA: Diagnosis present

## 2016-03-08 DIAGNOSIS — I69351 Hemiplegia and hemiparesis following cerebral infarction affecting right dominant side: Secondary | ICD-10-CM | POA: Diagnosis not present

## 2016-03-08 LAB — BASIC METABOLIC PANEL
ANION GAP: 12 (ref 5–15)
BUN: 16 mg/dL (ref 6–20)
CHLORIDE: 103 mmol/L (ref 101–111)
CO2: 26 mmol/L (ref 22–32)
Calcium: 8.9 mg/dL (ref 8.9–10.3)
Creatinine, Ser: 0.73 mg/dL (ref 0.44–1.00)
GFR calc Af Amer: >60 mL/min (ref >60)
GLUCOSE: 101 mg/dL — AB (ref 65–99)
POTASSIUM: 2.7 mmol/L — AB (ref 3.5–5.1)
Sodium: 141 mmol/L (ref 135–145)

## 2016-03-08 LAB — CBC
HEMATOCRIT: 37.7 % (ref 35.0–47.0)
HEMOGLOBIN: 13.3 g/dL (ref 12.0–16.0)
MCH: 31.8 pg (ref 26.0–34.0)
MCHC: 35.4 g/dL (ref 32.0–36.0)
MCV: 89.9 fL (ref 80.0–100.0)
Platelets: 209 10*3/uL (ref 150–440)
RBC: 4.19 MIL/uL (ref 3.80–5.20)
RDW: 13 % (ref 11.5–14.5)
WBC: 8.1 10*3/uL (ref 3.6–11.0)

## 2016-03-08 MED ORDER — DEXTROSE 5 % IV SOLN
1.0000 g | Freq: Every day | INTRAVENOUS | Status: DC
  Administered 2016-03-08 – 2016-03-10 (×3): 1 g via INTRAVENOUS
  Filled 2016-03-08 (×3): qty 10

## 2016-03-08 MED ORDER — PNEUMOCOCCAL VAC POLYVALENT 25 MCG/0.5ML IJ INJ
0.5000 mL | INJECTION | INTRAMUSCULAR | Status: AC
  Administered 2016-03-09: 0.5 mL via INTRAMUSCULAR
  Filled 2016-03-08: qty 0.5

## 2016-03-08 MED ORDER — ASPIRIN EC 325 MG PO TBEC: 325.0000 mg | DELAYED_RELEASE_TABLET | Freq: Every day | ORAL | Status: DC

## 2016-03-08 MED ORDER — ASPIRIN EC 81 MG PO TBEC
81.0000 mg | DELAYED_RELEASE_TABLET | Freq: Every day | ORAL | Status: DC
  Administered 2016-03-08 – 2016-03-11 (×4): 81 mg via ORAL
  Filled 2016-03-08 (×4): qty 1

## 2016-03-08 MED ORDER — POTASSIUM CHLORIDE 10 MEQ/100ML IV SOLN
10.0000 meq | INTRAVENOUS | Status: AC
  Administered 2016-03-08 (×4): 10 meq via INTRAVENOUS
  Filled 2016-03-08 (×4): qty 100

## 2016-03-08 NOTE — Evaluation (Signed)
Clinical/Bedside Swallow Evaluation Patient Details  Name: Alexandria Hanson MRN: [id number] Date of Birth: 1929/01/25  Today's Date: 03/08/2016 Time: SLP Start Time (ACUTE ONLY): 0830 SLP Stop Time (ACUTE ONLY): 0930 SLP Time Calculation (min) (ACUTE ONLY): 60 min  Past Medical History:  Past Medical History:  Diagnosis Date  . MI (mitral incompetence)   . Stroke [facility])    Past Surgical History:  Past Surgical History:  Procedure Laterality Date  . ABDOMINAL HYSTERECTOMY    . APPENDECTOMY    . CHOLECYSTECTOMY     HPI:  Pt is a 82 y.o. female w/ h/o MI and stroke. At 10:30 AM patient had a fall. Later when they checked at her she had no mobility in her right arm and her speech was slurred. She knows what she wants to say but unable to say it. Last week she did have a headache for 3 days. Code stroke was called. A she was not in the TPA window. She does take aspirin at home. Hospitalists were contacted for admission. Patient has a history of stroke in the past with baseline right-sided weakness. Patient unable to talk in complete sentences clearly. Pt continues to present w/ expressive language deficits; able to indicate wants/needs per NSG and family. Pt and family denied any prior difficulty swallowing before admission. Slight labial decreased tone noted on R side.    Assessment / Plan / Recommendation Clinical Impression  Pt appeared to adequately tolerate trials of thin liquids via cup, purees and soft solids w/ no overt s/s of aspiration during/post po trials. No decline in vocal quality or respiratory status w/ po intake noted. Oral phase c/b adequate bolus management and clearing given time and following strategies for lingual sweeping on R side to fully clear labially and orally; slight bolus residue noted in R corner of mouth on lower lip. Suspect min decreased awareness of bolus residue on R side d/t decreased sensation. Pt fed self w/ setup support using her Left hand(nondominent but  has used Left hand in past w/ previous stroke). Pt does wear a top denture. Pt would benefit from a Dysphagia 3 diet w/ thin liquids w/ aspiration precautions and tray setup at meals; assistance w/ feeding as needed. Recommend Meds in Puree - Whole for easier swallowing. ST will f/u w/ toleration of diet while admitted and modify as indicated. Family and pt agreed.     Aspiration Risk   (reduced following precautions)    Diet Recommendation  Dysphagia 3 diet consistency w/ thin liquids (no straw if indicated); aspiration precautions; feeding setup and assistance at meals.   Medication Administration: Whole meds with puree    Other  Recommendations Recommended Consults:  (TBD) Oral Care Recommendations: Oral care BID;Staff/trained caregiver to provide oral care   Follow up Recommendations  Home health SLP;Outpatient SLP;Inpatient Rehab;Skilled Nursing facility (TBD)    Frequency and Duration min 3x week  2 weeks       Prognosis Prognosis for Safe Diet Advancement: Good Barriers to Reach Goals: Language deficits      Swallow Study   General Date of Onset: 03/07/16 HPI: Pt is a 82 y.o. female w/ h/o MI and stroke. At 10:30 AM patient had a fall. Later when they checked at her she had no mobility in her right arm and her speech was slurred. She knows what she wants to say but unable to say it. Last week she did have a headache for 3 days. Code stroke was called. A she was not in  the TPA window. She does take aspirin at home. Hospitalists were contacted for admission. Patient has a history of stroke in the past with baseline right-sided weakness. Patient unable to talk in complete sentences clearly. Pt continues to present w/ expressive language deficits; able to indicate wants/needs per NSG and family. Pt and family denied any prior difficulty swallowing before admission. Slight labial decreased tone noted on R side.  Type of Study: Bedside Swallow Evaluation Previous Swallow Assessment:  none Diet Prior to this Study: Regular;Thin liquids Temperature Spikes Noted: No (wbc not elevated) Respiratory Status: Room air History of Recent Intubation: No Behavior/Cognition: Alert;Cooperative;Pleasant mood (expressive language deficits) Oral Cavity Assessment: Dry Oral Care Completed by SLP: Yes Oral Cavity - Dentition: Dentures, top;Missing dentition (on bottom) Vision: Functional for self-feeding Self-Feeding Abilities: Able to feed self;Needs assist;Needs set up (using Left hand d/t RUE weakness) Patient Positioning: Upright in bed Baseline Vocal Quality: Normal;Low vocal intensity Volitional Cough: Strong Volitional Swallow: Able to elicit    Oral/Motor/Sensory Function Overall Oral Motor/Sensory Function: Mild impairment Facial ROM: Reduced right (min) Facial Symmetry: Abnormal symmetry right (min) Facial Strength: Reduced right (min) Facial Sensation: Reduced right (min) Lingual ROM: Within Functional Limits (grossly) Lingual Symmetry: Within Functional Limits Lingual Strength: Within Functional Limits Lingual Sensation: Within Functional Limits Velum: Within Functional Limits Mandible: Within Functional Limits   Ice Chips Ice chips: Within functional limits Presentation: Spoon (fed; 3 trials)   Thin Liquid Thin Liquid: Within functional limits Presentation: Self Fed;Cup (~6 ozs total)    Nectar Thick Nectar Thick Liquid: Not tested   Honey Thick Honey Thick Liquid: Not tested   Puree Puree: Impaired Presentation: Self Fed;Spoon (2 ozs) Oral Phase Impairments: Reduced labial seal (slight) Oral Phase Functional Implications: Oral residue (slight labial residue on R side) Pharyngeal Phase Impairments:  (none)   Solid   GO   Solid: Impaired Presentation: Spoon;Self Fed (10+ trials) Oral Phase Impairments: Reduced labial seal (slight) Oral Phase Functional Implications: Oral residue (slight labial residue on R side) Pharyngeal Phase Impairments:  (none)         Jerilynn SomKatherine Georgia Baria, MS, CCC-SLP  Caileen Veracruz 03/08/2016,2:24 PM

## 2016-03-08 NOTE — Progress Notes (Signed)
PT Cancellation Note  Patient Details Name: Tenna Childlsie C Kite MRN: [id number] DOB: 01-07-1929   Cancelled Treatment:    Reason Eval/Treat Not Completed: Medical issues which prohibited therapy. Patient had 2.7 potassium today.  Ezekiel InaKristine S Kathye Cipriani, PT, DPT  [place], Barkley BrunsKristine S 03/08/2016, 2:00 PM

## 2016-03-08 NOTE — Progress Notes (Signed)
[facility] [place] at [facility]                                                                                                                                                                                            Patient Demographics   Alexandria Hanson, is a 82 y.o. female, DOB - 05/09/1929, ZOX:[id number]  Admit date - 03/07/2016   Admitting Physician Alford Highland, MD  Outpatient Primary MD for the patient is Gabriel Cirri, NP   LOS - 0  Subjective: Patient admitted with expressive aphasia and right-sided weakness unable to communicate still unable to move right upper extremity some movement of the right lower extremity    Review of Systems:   CONSTITUTIONAL:Unable to express herself to obtain a proper review of systems cannot write with her left hand.    Vitals:   Vitals:   03/08/16 0404 03/08/16 0632 03/08/16 0750 03/08/16 1000  BP: (!) 137/52 (!) 135/46 (!) 137/50 (!) 164/72  Pulse: 81 87 90   Resp: (!) 24 (!) 24 18 17   Temp: 98 F (36.7 C) 98.2 F (36.8 C) 99.1 F (37.3 C) 98.6 F (37 C)  TempSrc: Oral Oral Oral Oral  SpO2: 97% 94% 93% 94%  Weight:      Height:        Wt Readings from Last 3 Encounters:  03/08/16 138 lb 12.8 oz (63 kg)     Intake/Output Summary (Last 24 hours) at 03/08/16 1404 Last data filed at 03/08/16 0800  Gross per 24 hour  Intake              240 ml  Output              100 ml  Net              140 ml    Physical Exam:   GENERAL: Pleasant-appearing in no apparent distress.  HEAD, EYES, EARS, NOSE AND THROAT: Atraumatic, normocephalic. Extraocular muscles are intact. Pupils equal and reactive to light. Sclerae anicteric. No conjunctival injection. No oro-pharyngeal erythema.  NECK: Supple. There is no jugular venous distention. No bruits, no lymphadenopathy, no thyromegaly.  HEART: Regular rate and rhythm,. No murmurs, no rubs, no clicks.  LUNGS: Clear to auscultation bilaterally. No rales or rhonchi.  No wheezes.  ABDOMEN: Soft, flat, nontender, nondistended. Has good bowel sounds. No hepatosplenomegaly appreciated.  EXTREMITIES: No evidence of any cyanosis, clubbing, or peripheral edema.  +2 pedal and radial pulses bilaterally.  NEUROLOGIC: Patient with expressive aphasia left upper extremity umable to move SKIN: Moist and warm with no rashes appreciated.  Psych: Not anxious,  depressed LN: No inguinal LN enlargement    Antibiotics   Anti-infectives    Start     Dose/Rate Route Frequency Ordered Stop   03/08/16 1300  cefTRIAXone (ROCEPHIN) 1 g in dextrose 5 % 50 mL IVPB     1 g 100 mL/hr over 30 Minutes Intravenous Daily 03/08/16 1101        Medications   Scheduled Meds: . aspirin EC  81 mg Oral Daily  . atorvastatin  40 mg Oral q1800  . cefTRIAXone (ROCEPHIN)  IV  1 g Intravenous Daily  . clopidogrel  75 mg Oral Daily  . enoxaparin (LOVENOX) injection  40 mg Subcutaneous Q24H  . FLUoxetine  20 mg Oral Daily  . [START ON 03/09/2016] pneumococcal 23 valent vaccine  0.5 mL Intramuscular Tomorrow-1000  . potassium chloride  20 mEq Oral Once   Continuous Infusions:  PRN Meds:.acetaminophen **OR** acetaminophen   Data Review:   Micro Results No results found for this or any previous visit (from the past 240 hour(s)).  Radiology Reports Mr [name] ZO Contrast  Result Date: 03/08/2016 CLINICAL DATA:  Abnormal speech and right upper extremity weakness after a fall at 10:30 a.m. today. Recent headaches. EXAM: MRI HEAD WITHOUT CONTRAST MRA HEAD WITHOUT CONTRAST TECHNIQUE: Multiplanar, multiecho pulse sequences of the brain and surrounding structures were obtained without intravenous contrast. Angiographic images of the head were obtained using MRA technique without contrast. COMPARISON:  None. FINDINGS: MRI HEAD FINDINGS Acute nonhemorrhagic infarct is present within the posterior left frontal lobe. This involves the left frontal operculum and the precentral gyrus. Maximum  dimension of the infarct is 4 cm. T2 changes are present within the area of restricted diffusion. More remote lacunar infarcts are present within the basal ganglia and centrum semi of ally bilaterally, left greater than right. Asymmetric subcortical white matter disease is present in the left hemisphere. Additional remote lacunar infarcts are present in the basal ganglia and cerebellum bilaterally. White matter changes extend into the brainstem. Flow is present in the major intracranial arteries. The skullbase is within normal limits. A relatively empty sella is present. Midline sagittal structures are otherwise unremarkable. The upper cervical spine is within normal limits. The left cerebral peduncle is smaller than the right suggesting remote ischemia and will layering degeneration. The paranasal sinuses and mastoid air cells are clear. The globes and orbits are intact. Hyperostosis frontalis internus is noted. MRA HEAD FINDINGS The internal carotid arteries are within normal limits from the high cervical segments through the ICA termini bilaterally. Minimal atherosclerotic changes present within the cavernous segments. Moderate stenoses are present in the left A1 segment. There is mild irregularity in the right A1 segment. Mild distal left M1 segment narrowing is present. Moderate segmental narrowing is present within proximal M2 branches on the left without focal occlusion. More mild segmental disease is present on the right. There is irregular narrowing of the ACA branches bilaterally. The left vertebral artery is the dominant vessel. The left PICA origin is visualized and normal. The right AICA is dominant. The basilar artery is within normal limits. Both posterior cerebral arteries originate the basilar tip. Moderate narrowing is present within proximal PCA branch vessels bilaterally. IMPRESSION: 1. Acute nonhemorrhagic infarct within the posterior left frontal lobe involving the left frontal operculum and  precentral gyrus. 2. Additional remote lacunar infarcts are present within the centrum semi of ally bilaterally, left greater than right. 3. Asymmetric left-sided white matter disease. 4. Moderate diffuse medium and small vessel disease as described  above. There is asymmetric attenuation of proximal M2 branches on the left compared to the right. 5. Mild distal left M1 segment stenosis. 6. Moderate distal left A1 segment stenosis. 7. Moderate proximal P2 segment stenoses bilaterally. These results were called by telephone at the time of interpretation on 03/08/2016 at 1:31 pm to Dr. Auburn Bilberry , who verbally acknowledged these results. Electronically Signed   By: Marin Roberts M.D.   On: 03/08/2016 13:32   Mr Brain Wo Contrast  Result Date: 03/08/2016 CLINICAL DATA:  Abnormal speech and right upper extremity weakness after a fall at 10:30 a.m. today. Recent headaches. EXAM: MRI HEAD WITHOUT CONTRAST MRA HEAD WITHOUT CONTRAST TECHNIQUE: Multiplanar, multiecho pulse sequences of the brain and surrounding structures were obtained without intravenous contrast. Angiographic images of the head were obtained using MRA technique without contrast. COMPARISON:  None. FINDINGS: MRI HEAD FINDINGS Acute nonhemorrhagic infarct is present within the posterior left frontal lobe. This involves the left frontal operculum and the precentral gyrus. Maximum dimension of the infarct is 4 cm. T2 changes are present within the area of restricted diffusion. More remote lacunar infarcts are present within the basal ganglia and centrum semi of ally bilaterally, left greater than right. Asymmetric subcortical white matter disease is present in the left hemisphere. Additional remote lacunar infarcts are present in the basal ganglia and cerebellum bilaterally. White matter changes extend into the brainstem. Flow is present in the major intracranial arteries. The skullbase is within normal limits. A relatively empty sella is present.  Midline sagittal structures are otherwise unremarkable. The upper cervical spine is within normal limits. The left cerebral peduncle is smaller than the right suggesting remote ischemia and will layering degeneration. The paranasal sinuses and mastoid air cells are clear. The globes and orbits are intact. Hyperostosis frontalis internus is noted. MRA HEAD FINDINGS The internal carotid arteries are within normal limits from the high cervical segments through the ICA termini bilaterally. Minimal atherosclerotic changes present within the cavernous segments. Moderate stenoses are present in the left A1 segment. There is mild irregularity in the right A1 segment. Mild distal left M1 segment narrowing is present. Moderate segmental narrowing is present within proximal M2 branches on the left without focal occlusion. More mild segmental disease is present on the right. There is irregular narrowing of the ACA branches bilaterally. The left vertebral artery is the dominant vessel. The left PICA origin is visualized and normal. The right AICA is dominant. The basilar artery is within normal limits. Both posterior cerebral arteries originate the basilar tip. Moderate narrowing is present within proximal PCA branch vessels bilaterally. IMPRESSION: 1. Acute nonhemorrhagic infarct within the posterior left frontal lobe involving the left frontal operculum and precentral gyrus. 2. Additional remote lacunar infarcts are present within the centrum semi of ally bilaterally, left greater than right. 3. Asymmetric left-sided white matter disease. 4. Moderate diffuse medium and small vessel disease as described above. There is asymmetric attenuation of proximal M2 branches on the left compared to the right. 5. Mild distal left M1 segment stenosis. 6. Moderate distal left A1 segment stenosis. 7. Moderate proximal P2 segment stenoses bilaterally. These results were called by telephone at the time of interpretation on 03/08/2016 at 1:31 pm  to Dr. Auburn Bilberry , who verbally acknowledged these results. Electronically Signed   By: Marin Roberts M.D.   On: 03/08/2016 13:32   US Carotid Bilateral  Result Date: 03/08/2016 CLINICAL DATA:  82 year old female with a history of cerebral vascular accident. Cardiovascular risk factors  include a known prior stroke. EXAM: BILATERAL CAROTID DUPLEX ULTRASOUND TECHNIQUE: Wallace Cullens scale imaging, color Doppler and duplex ultrasound were performed of bilateral carotid and vertebral arteries in the neck. COMPARISON:  MR 03/08/2016 FINDINGS: Criteria: Quantification of carotid stenosis is based on velocity parameters that correlate the residual internal carotid diameter with NASCET-based stenosis levels, using the diameter of the distal internal carotid lumen as the denominator for stenosis measurement. The following velocity measurements were obtained: RIGHT ICA:  Systolic 53 cm/sec, Diastolic 13 cm/sec CCA:  61 cm/sec SYSTOLIC ICA/CCA RATIO:  1.1 ECA:  80 cm/sec LEFT ICA:  Systolic 66 cm/sec, Diastolic 14 cm/sec CCA:  58 cm/sec SYSTOLIC ICA/CCA RATIO:  0.9 ECA:  156 cm/sec Right Brachial SBP: Not acquired Left Brachial SBP: Not acquired RIGHT CAROTID ARTERY: No significant calcifications of the right common carotid artery. Intermediate waveform maintained. Heterogeneous plaque at the right carotid bifurcation. No significant lumen shadowing. Low resistance waveform of the right ICA. No significant tortuosity. RIGHT VERTEBRAL ARTERY: Antegrade flow with low resistance waveform. LEFT CAROTID ARTERY: No significant calcifications of the left common carotid artery. Intermediate waveform maintained. Heterogeneous and partially calcified plaque at the left carotid bifurcation without significant lumen shadowing. Low resistance waveform of the left ICA. No significant tortuosity. LEFT VERTEBRAL ARTERY:  Antegrade flow with low resistance waveform. IMPRESSION: Color duplex indicates minimal heterogeneous and calcified  plaque, with no hemodynamically significant stenosis by duplex criteria in the extracranial cerebrovascular circulation. Signed, Yvone Neu. Loreta Ave, DO [facility] Electronically Signed   By: Gilmer Mor D.O.   On: 03/08/2016 13:56   Ct Head Code Stroke W/o Cm  Result Date: 03/07/2016 CLINICAL DATA:  Code stroke. Acute onset RIGHT-sided weakness and speech difficulty. History of LEFT subdural hematoma. EXAM: CT HEAD WITHOUT CONTRAST TECHNIQUE: Contiguous axial images were obtained from the base of the skull through the vertex without intravenous contrast. COMPARISON:  CT HEAD December 22, 2010 FINDINGS: BRAIN: No intraparenchymal hemorrhage, mass effect, midline shift or acute large vascular territory infarct. Old LEFT basal ganglia infarct with ex vacuo dilatation LEFT lateral ventricle. Smaller LEFT cerebral peduncle compatible with wallerian degeneration. Old small RIGHT cerebellar infarcts. Stable LEFT posterior fossa arachnoid cyst with mild mass effect. Resolution of LEFT subdural hemorrhage. VASCULAR: Moderate to severe calcific atherosclerosis of the carotid siphons. SKULL: No skull fracture. No significant scalp soft tissue swelling. Hyperostosis frontalis internus. Multiple calcified scalp sebaceous cysts. SINUSES/ORBITS: The included ocular globes and orbital contents are non-suspicious.The mastoid air-cells and included paranasal sinuses are well-aerated. OTHER: None. ASPECTS [facility] Stroke Program Early CT Score) - Ganglionic level infarction (caudate, lentiform nuclei, internal capsule, insula, M1-M3 cortex): 7 - Supraganglionic infarction (M4-M6 cortex): 3 Total score (0-10 with 10 being normal): 10 IMPRESSION: 1. No acute intracranial process. Old LEFT basal ganglia infarct and moderate chronic small vessel ischemic disease. 2. ASPECTS is 10. Acute findings discussed with and reconfirmed by Dr.PHILLIP STAFFORD on 03/07/2016 at 6:24 pm.  Electronically Signed   By: Awilda Metro M.D.   On: 03/07/2016 18:26     CBC  Recent Labs Lab 03/07/16 1810 03/08/16 0342  WBC 10.6 8.1  HGB 14.6 13.3  HCT 41.6 37.7  PLT 246 209  MCV 90.6 89.9  MCH 31.8 31.8  MCHC 35.1 35.4  RDW 13.2 13.0  LYMPHSABS 4.8*  --   MONOABS 0.6  --   EOSABS 0.1  --   BASOSABS 0.1  --     Chemistries   Recent Labs Lab 03/07/16  1810 03/08/16 0342  NA 140 141  K 3.1* 2.7*  CL 102 103  CO2 27 26  GLUCOSE 101* 101*  BUN 19 16  CREATININE 0.78 0.73  CALCIUM 9.3 8.9  AST 33  --   ALT 25  --   ALKPHOS 73  --   BILITOT 1.1  --    ------------------------------------------------------------------------------------------------------------------ estimated creatinine clearance is 45.9 mL/min (by C-G formula based on SCr of 0.8 mg/dL). ------------------------------------------------------------------------------------------------------------------ No results for input(s): HGBA1C in the last 72 hours. ------------------------------------------------------------------------------------------------------------------ No results for input(s): CHOL, HDL, LDLCALC, TRIG, CHOLHDL, LDLDIRECT in the last 72 hours. ------------------------------------------------------------------------------------------------------------------ No results for input(s): TSH, T4TOTAL, T3FREE, THYROIDAB in the last 72 hours.  Invalid input(s): FREET3 ------------------------------------------------------------------------------------------------------------------ No results for input(s): VITAMINB12, FOLATE, FERRITIN, TIBC, IRON, RETICCTPCT in the last 72 hours.  Coagulation profile  Recent Labs Lab 03/07/16 1810  INR 0.98    No results for input(s): DDIMER in the last 72 hours.  Cardiac Enzymes  Recent Labs Lab 03/07/16 1810  TROPONINI <0.03    ------------------------------------------------------------------------------------------------------------------ Invalid input(s): POCBNP    Assessment & Plan   Pt is 82 year old with expressive aphasia and right-sided weakness  1. 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 Hyperlipidemia continue atorvastatin  3, urinary tract infection treated with IV ceftriaxone and await urine cultures  4. Hypokalemia replace potassium  5. Hyperlipidemia continue therapy with atorvastatin      Code Status Orders        Start     Ordered   03/07/16 2005  Do not attempt resuscitation (DNR)  Continuous    Question Answer Comment  In the event of cardiac or respiratory ARREST Do not call a "code blue"   In the event of cardiac or respiratory ARREST Do not perform Intubation, CPR, defibrillation or ACLS   In the event of cardiac or respiratory ARREST Use medication by any route, position, wound care, and other measures to relive pain and suffering. May use oxygen, suction and manual treatment of airway obstruction as needed for comfort.   Comments nurse may pronounce      03/07/16 2004    Code Status History    Date Active Date Inactive Code Status Order ID Comments User Context   03/07/2016  8:04 PM 03/08/2016 11:21 AM DNR [id number]  Alford Highlandichard Wieting, MD ED    Questions for Most Recent Historical Code Status (Order [id number])    Question Answer Comment   In the event of cardiac or respiratory ARREST Do not call a "code blue"    In the event of cardiac or respiratory ARREST Do not perform Intubation, CPR, defibrillation or ACLS    In the event of cardiac or respiratory ARREST Use medication by any route, position, wound care, and other measures to relive pain and suffering. May use oxygen, suction and manual treatment of airway obstruction as needed for comfort.    Comments nurse may pronounce             Consultsnone   DVT Prophylaxis  Lovenox   Lab Results  Component Value Date   PLT 209 03/08/2016     Time Spent in minutes  35min  Greater than 50% of time spent in care coordination and counseling patient regarding the condition and plan of care.   Auburn BilberryPATEL, Edu On M.D on 03/08/2016 at 2:04 PM  Between 7am to 6pm - Pager - [phone]  After 6pm go to [email or web address] - password  [facility]  [facility] [facility]   Office  [phone]

## 2016-03-08 NOTE — Progress Notes (Signed)
*  PRELIMINARY RESULTS* Echocardiogram 2D Echocardiogram has been performed.  Alexandria Hanson 03/08/2016, 11:45 AM

## 2016-03-08 NOTE — Evaluation (Signed)
Speech Language Pathology Evaluation Patient Details Name: Tenna Childlsie C Ringgenberg MRN: [id number] DOB: 1928/12/05 Today's Date: 03/08/2016 Time: 0930-1030 SLP Time Calculation (min) (ACUTE ONLY): 60 min  Problem List:  Patient Active Problem List   Diagnosis Date Noted  . Stroke [facility](HCC) 03/08/2016   Past Medical History:  Past Medical History:  Diagnosis Date  . MI (mitral incompetence)   . Stroke [facility](HCC)    Past Surgical History:  Past Surgical History:  Procedure Laterality Date  . ABDOMINAL HYSTERECTOMY    . APPENDECTOMY    . CHOLECYSTECTOMY     HPI:  Pt is a 82 y.o. female w/ h/o MI and stroke. At 10:30 AM patient had a fall. Later when they checked at her she had no mobility in her right arm and her speech was slurred. She knows what she wants to say but unable to say it. Last week she did have a headache for 3 days. Code stroke was called. A she was not in the TPA window. She does take aspirin at home. Hospitalists were contacted for admission. Patient has a history of stroke in the past with baseline right-sided weakness. Patient unable to talk in complete sentences clearly. Pt continues to present w/ expressive language deficits; able to indicate wants/needs per NSG and family.    Assessment / Plan / Recommendation Clinical Impression  Pt appears to present w/ adequate receptive language abilities w/ moderate-severe expressive language deficits. Auditory comprehension appeared grossly wfl for basic-complex tasks of y/n questions and identifying objects and object function and including following 1-2 step commands. Expressive Aphasia impaired accurate responding to Cognitive tasks and questions. It is difficult to assess the full degree of pt's linguistic strengths and weaknesses d/t the degree of Expressive language deficits. Pt is able to respond to phonemic and semantic cues as language strategies to improve verbal output and word completion but appears unaware of her jargon speech and word  errors (audotory loop deficit). Pt's Expressive Aphasia is an interfering factor impeding pt's communication abilitites. Pt would benefit from skilled ST services to improve effective communication strategies for ADLs. Of note, pt appeared to recognize and respond w/ gestures and relazed expression to family during session.     SLP Assessment  Patient needs continued Speech Lanaguage Pathology Services    Follow Up Recommendations  Skilled Nursing facility;Outpatient SLP;Home health SLP;Inpatient Rehab (TBD)    Frequency and Duration min 3x week  2 weeks      SLP Evaluation Prior Functioning  Cognitive/Linguistic Baseline: Within functional limits Type of Home: House  Lives With: Family;Alone Available Help at Discharge: Family (many) Vocation: Retired   IT consultantCognition  Overall Cognitive Status: Within Functional Limits for tasks assessed Arousal/Alertness: Awake/alert Orientation Level: Oriented to person;Oriented to place;Oriented to time;Oriented to situation (expressive language deficits impede communication) Attention: Focused;Sustained Focused Attention: Appears intact Sustained Attention: Appears intact Memory:  (expressive language deficits impede communication) Awareness: Appears intact Problem Solving: Appears intact (grossly) Executive Function:  (expressive language deficits impede communication) Behaviors:  (none) Safety/Judgment:  (expressive language deficits impede communication) Comments: did not fully assess d/t pt leaving for test    Comprehension  Auditory Comprehension Overall Auditory Comprehension: Appears within functional limits for tasks assessed (grossly during the bedside screening) Yes/No Questions: Within Functional Limits Commands: Within Functional Limits (for 1 and 2 step commands) Conversation:  (expressive language deficits impede communication) Interfering Components:  (none) Visual Recognition/Discrimination Discrimination: Within Function  Limits Reading Comprehension Reading Status: Not tested    Expression Expression Primary Mode of  Expression: Verbal Verbal Expression Overall Verbal Expression: Impaired Initiation: No impairment Automatic Speech:  (impaired) Level of Generative/Spontaneous Verbalization: Word (impaired) Repetition: Impaired Level of Impairment: Word level Naming: Impairment Responsive: 0-25% accurate Other Naming Comments: phonemic/semantic cues aided word completion Verbal Errors: Semantic paraphasias;Phonemic paraphasias;Jargon;Not aware of errors;Neologisms Pragmatics: No impairment Effective Techniques: Phonemic cues;Semantic cues (Y/N questions) Non-Verbal Means of Communication: Gestures;Communication board ((?)) Other Verbal Expression Comments: using y/n questions Written Expression Dominant Hand: Left ((?)) Written Expression: Not tested   Oral / Motor  Oral Motor/Sensory Function Overall Oral Motor/Sensory Function: Within functional limits Motor Speech Overall Motor Speech: Appears within functional limits for tasks assessed Respiration: Within functional limits Phonation: Normal Resonance: Within functional limits Intelligibility: Intelligibility reduced (rushed speech) Word: 25-49% accurate Motor Planning: Witnin functional limits   GO                    Jerilynn Som, MS, CCC-SLP  Elaysia Devargas 03/08/2016, 12:46 PM

## 2016-03-08 NOTE — Evaluation (Deleted)
Speech Language Pathology Evaluation Patient Details Name: Alexandria Hanson MRN: [id number] DOB: Jul 17, 1928 Today's Date: 03/08/2016 Time: 0930-1030 SLP Time Calculation (min) (ACUTE ONLY): 60 min  Problem List:  Patient Active Problem List   Diagnosis Date Noted  . Stroke Alexandria Hanson) 03/08/2016   Past Medical History:  Past Medical History:  Diagnosis Date  . MI (mitral incompetence)   . Stroke [facility])    Past Surgical History:  Past Surgical History:  Procedure Laterality Date  . ABDOMINAL HYSTERECTOMY    . APPENDECTOMY    . CHOLECYSTECTOMY     HPI:  Pt is a 82 y.o. female w/ h/o MI and stroke. At 10:30 AM patient had a fall. Later when they checked at her she had no mobility in her right arm and her speech was slurred. She knows what she wants to say but unable to say it. Last week she did have a headache for 3 days. Code stroke was called. A she was not in the TPA window. She does take aspirin at home. Hospitalists were contacted for admission. Patient has a history of stroke in the past with baseline right-sided weakness. Patient unable to talk in complete sentences clearly. Pt continues to present w/ expressive language deficits; able to indicate wants/needs per NSG and family.    Assessment / Plan / Recommendation Clinical Impression  Pt appears to present w/ adequate receptive language abilities w/ moderate-severe expressive language deficits. Auditory comprehension appeared grossly wfl for basic-complex tasks of y/n questions and identifying objects and object function and including following 1-2 step commands. Expressive Aphasia impaired accurate responding to Cognitive tasks and questions. It is difficult to assess the full degree of pt's linguistic strengths and weaknesses d/t the degree of Expressive language deficits. Pt is able to respond to phonemic and semantic cues as language strategies but appears unaware of her  Of note, pt appeared to read 1-2 words from a Birthday Card but  could not proceed further w/ reading(no glassess were needed it appeared). Pt's behaviors and poor comprehension are interfering factors impeding pt's communication abilitites. Pt would benefit from skilled ST services to improve effective communication strategies for ADLs. Of note, pt appeared to recognize and respond w/ gestures and relazed expression to a close friend that visited during session.     SLP Assessment       Follow Up Recommendations       Frequency and Duration           SLP Evaluation Prior Functioning  Cognitive/Linguistic Baseline: Within functional limits Type of Home: House  Lives With: Family;Alone Available Help at Discharge: Family (many) Vocation: Retired   IT consultant  Overall Cognitive Status: Within Functional Limits for tasks assessed Arousal/Alertness: Awake/alert Orientation Level: Oriented to person;Oriented to place;Oriented to time;Oriented to situation (expressive language deficits impede communication) Attention: Focused;Sustained Focused Attention: Appears intact Sustained Attention: Appears intact Memory:  (expressive language deficits impede communication) Awareness: Appears intact Problem Solving: Appears intact (grossly) Executive Function:  (expressive language deficits impede communication) Behaviors:  (none) Safety/Judgment:  (expressive language deficits impede communication) Comments: did not fully assess d/t pt leaving for test    Comprehension  Auditory Comprehension Overall Auditory Comprehension: Appears within functional limits for tasks assessed (grossly during the bedside screening) Yes/No Questions: Within Functional Limits Commands: Within Functional Limits (for 1 and 2 step commands) Conversation:  (expressive language deficits impede communication) Interfering Components:  (none) Visual Recognition/Discrimination Discrimination: Within Function Limits Reading Comprehension Reading Status: Not tested    Expression  Expression  Primary Mode of Expression: Verbal Verbal Expression Overall Verbal Expression: Impaired Initiation: No impairment Automatic Speech:  (impaired) Level of Generative/Spontaneous Verbalization: Word (impaired) Repetition: Impaired Level of Impairment: Word level Naming: Impairment Responsive: 0-25% accurate Other Naming Comments: phonemic/semantic cues aided word completion Verbal Errors: Semantic paraphasias;Phonemic paraphasias;Jargon;Not aware of errors;Neologisms Pragmatics: No impairment Effective Techniques: Phonemic cues;Semantic cues (Y/N questions) Non-Verbal Means of Communication: Gestures;Communication board ((?)) Other Verbal Expression Comments: using y/n questions Written Expression Dominant Hand: Left ((?)) Written Expression: Not tested   Oral / Motor  Oral Motor/Sensory Function Overall Oral Motor/Sensory Function: Within functional limits Motor Speech Overall Motor Speech: Appears within functional limits for tasks assessed Respiration: Within functional limits Phonation: Normal Resonance: Within functional limits Intelligibility: Intelligibility reduced (rushed speech) Word: 25-49% accurate Motor Planning: Witnin functional limits   GO                    Jerilynn SomKatherine Yandell Mcjunkins, MS, CCC-SLP  [name] 03/08/2016, 12:10 PM

## 2016-03-08 NOTE — Evaluation (Signed)
Occupational Therapy Evaluation Patient Details Name: Alexandria Hanson Carnathan MRN: [id number] DOB: 08-Nov-1928 Today's Date: 03/08/2016    History of Present Illness Patient admitted for CVA following headache for 3 days last week. History of CVA with right sided weakness prior to this episode.   Clinical Impression   Patient lying in bed when OT arrived. Patient able to state name clearly, but unable to understand any other information she provided. Patient appears to understand commands, and was able to follow multi step commands when asked. When asked to limit answers to yes/no, was able to communicate by shaking/nodding head to questions about PLOF and living status. Patient able to indicate that she lives with family, and she has some assist. Several times indicated that she was able to perform dressing prior to admission. Unable to clearly get list of DME, but patient indicated she does have a wheelchair, but needs daughter to propel her in it. Unclear if she was ambulating prior to admission. Increased tone noted in RUE and RLE. Patient had increased tone in right knee, and foot in plantar flexion. RUE was positioned with elbow in extension and hand fisted. With slow stretch, OT was able to flex elbow to 90 degrees, and fully extend digits. Patient agreeable to placing rolled washcloth in hand, and attempted to assist OT in positioning it. Patient indicated several times that she did not have a splint for RUE prior to admission. Patient was able to perform bed mobility with MIN A using bed rails. Repositioned self back in supine with assist for Right side. Patient able to maintain balance sitting with supervision, but standing was not assessed. Per ST, patient was able to feed self during ST evaluation using LUE. Patient indicated she used to be RUE dominant, but now uses LUE. Patient would benefit from further OT services for ROM and tone management techniques for RUE, and balance for increased independence  with ADL tasks.    Follow Up Recommendations  SNF    Equipment Recommendations       Recommendations for Other Services PT consult;Speech consult     Precautions / Restrictions Precautions Precautions: Fall Restrictions Weight Bearing Restrictions: No      Mobility Bed Mobility Overal bed mobility: Needs Assistance Bed Mobility: Supine to Sit     Supine to sit: Min assist        Transfers                      Balance                                            ADL Overall ADL's : Needs assistance/impaired                 Upper Body Dressing : Moderate assistance   Lower Body Dressing: Maximal assistance                 General ADL Comments:  (Increased tone and decreased functional movement RUE and RLE.)     Vision     Perception     Praxis      Pertinent Vitals/Pain Pain Assessment: No/denies pain     Hand Dominance  (Previous CVA with R hemiplegia. Pt indicated now uses LUE.)   Extremity/Trunk Assessment Upper Extremity Assessment Upper Extremity Assessment: RUE deficits/detail RUE Deficits / Details:  (Increased tone RUE. Unable to move  on command.)   Lower Extremity Assessment Lower Extremity Assessment: Defer to PT evaluation;RLE deficits/detail RLE Deficits / Details:  (Decreased functional movement RLE. Right foot in plantar flexed position.)       Communication Communication Communication: Expressive difficulties   Cognition Arousal/Alertness: Awake/alert Behavior During Therapy: WFL for tasks assessed/performed Overall Cognitive Status: Within Functional Limits for tasks assessed                     General Comments       Exercises       Shoulder Instructions      Home Living Family/patient expects to be discharged to:: Private residence Living Arrangements: Children (daughter) Available Help at Discharge: Family Type of Home: House                       Home  Equipment: Wheelchair - manual      Lives With: Family    Prior Functioning/Environment Level of Independence: Needs assistance (Expressive Aphasia, but was able to respond yes/no to questions about assist)    ADL's / Homemaking Assistance Needed:  (Expressive aphasia, but answered yes/no about assist with bathing and some ADL tasks. Did indicate she could dress self PLOF.)        OT Diagnosis: Generalized weakness;Hemiplegia dominant side   OT Problem List: Decreased range of motion;Impaired balance (sitting and/or standing);Decreased knowledge of use of DME or AE;Impaired tone   OT Treatment/Interventions: Self-care/ADL training;Neuromuscular education;DME and/or AE instruction;Patient/family education;Therapeutic exercise;Therapeutic activities    OT Goals(Current goals can be found in the care plan section) Acute Rehab OT Goals Patient Stated Goal:  (Patient unable to express goal, but indicated yes when asked about getting stronger to go back home.) OT Goal Formulation: With patient Time For Goal Achievement: 03/22/16 Potential to Achieve Goals: Good  OT Frequency: Min 1X/week   Barriers to D/Hanson:            Co-evaluation              End of Session    Activity Tolerance: Patient tolerated treatment well Patient left: in bed;with call bell/phone within reach;with bed alarm set   Time: 4098-1191 OT Time Calculation (min): 27 min Charges:  OT General Charges $OT Visit: 1 Procedure OT Evaluation $OT Eval Low Complexity: 1 Procedure OT Treatments $Self Care/Home Management : 8-22 mins G-Codes:    Kellyanne Ellwanger L 03-29-16, 5:02 PM  Kirstie Peri, OTR/L

## 2016-03-09 DIAGNOSIS — I340 Nonrheumatic mitral (valve) insufficiency: Secondary | ICD-10-CM | POA: Diagnosis not present

## 2016-03-09 DIAGNOSIS — G8321 Monoplegia of upper limb affecting right dominant side: Secondary | ICD-10-CM | POA: Diagnosis not present

## 2016-03-09 DIAGNOSIS — Z823 Family history of stroke: Secondary | ICD-10-CM | POA: Diagnosis not present

## 2016-03-09 DIAGNOSIS — R4701 Aphasia: Secondary | ICD-10-CM | POA: Diagnosis not present

## 2016-03-09 DIAGNOSIS — I10 Essential (primary) hypertension: Secondary | ICD-10-CM | POA: Diagnosis not present

## 2016-03-09 DIAGNOSIS — Z23 Encounter for immunization: Secondary | ICD-10-CM | POA: Diagnosis not present

## 2016-03-09 DIAGNOSIS — I2510 Atherosclerotic heart disease of native coronary artery without angina pectoris: Secondary | ICD-10-CM | POA: Diagnosis not present

## 2016-03-09 DIAGNOSIS — B9620 Unspecified Escherichia coli [E. coli] as the cause of diseases classified elsewhere: Secondary | ICD-10-CM | POA: Diagnosis not present

## 2016-03-09 DIAGNOSIS — I639 Cerebral infarction, unspecified: Secondary | ICD-10-CM | POA: Diagnosis not present

## 2016-03-09 DIAGNOSIS — Z8249 Family history of ischemic heart disease and other diseases of the circulatory system: Secondary | ICD-10-CM | POA: Diagnosis not present

## 2016-03-09 DIAGNOSIS — Z7982 Long term (current) use of aspirin: Secondary | ICD-10-CM | POA: Diagnosis not present

## 2016-03-09 DIAGNOSIS — E876 Hypokalemia: Secondary | ICD-10-CM | POA: Diagnosis not present

## 2016-03-09 DIAGNOSIS — E785 Hyperlipidemia, unspecified: Secondary | ICD-10-CM | POA: Diagnosis not present

## 2016-03-09 DIAGNOSIS — N390 Urinary tract infection, site not specified: Secondary | ICD-10-CM | POA: Diagnosis not present

## 2016-03-09 DIAGNOSIS — I69351 Hemiplegia and hemiparesis following cerebral infarction affecting right dominant side: Secondary | ICD-10-CM | POA: Diagnosis not present

## 2016-03-09 LAB — BASIC METABOLIC PANEL
Anion gap: 9 (ref 5–15)
BUN: 14 mg/dL (ref 6–20)
CHLORIDE: 107 mmol/L (ref 101–111)
CO2: 25 mmol/L (ref 22–32)
CREATININE: 0.67 mg/dL (ref 0.44–1.00)
Calcium: 9 mg/dL (ref 8.9–10.3)
GFR calc non Af Amer: >60 mL/min (ref >60)
Glucose, Bld: 105 mg/dL — ABNORMAL HIGH (ref 65–99)
POTASSIUM: 3 mmol/L — AB (ref 3.5–5.1)
SODIUM: 141 mmol/L (ref 135–145)

## 2016-03-09 LAB — ECHOCARDIOGRAM COMPLETE
HEIGHTINCHES: 63 in
Weight: 2220.8 oz

## 2016-03-09 MED ORDER — POTASSIUM CHLORIDE CRYS ER 20 MEQ PO TBCR
40.0000 meq | EXTENDED_RELEASE_TABLET | Freq: Three times a day (TID) | ORAL | Status: AC
  Administered 2016-03-09 (×3): 40 meq via ORAL
  Filled 2016-03-09 (×3): qty 2

## 2016-03-09 NOTE — Evaluation (Signed)
Physical Therapy Evaluation Patient Details Name: Alexandria Hanson MRN: [id number] DOB: [birth year] Today's Date: 03/09/2016   History of Present Illness  Alexandria Hanson  is a 82 y.o. female. At 10:30 AM patient had a fall. Later when they checked at her she had no mobility in her right arm and her speech was slurred. She knows what she wants to say but unable to say it. Patient was diagnosed with Acute L CVA.  Clinical Impression  82 yo Female came to ED after sustaining a fall and feeling RUE weakness and slurred speech. Patient did require some assistance at baseline from previous strokes. However it is difficult to determine how much given patient's expressive difficulties. She was able to verbalize a few words today during history intake but does get frustrated with word finding often. Patient currently is min A for bed mobility; she is mod A for sit<>stand transfer and stand pivot transfer. She demonstrates increased tone in RLE and increased PF contracture which limits standing tolerance on RLE. Patient was unable to shift weight to RLE in standing. She was unable to ambulate today. She would benefit from additional skilled PT Intervention to work on functional mobility, gait ability and strength to return to PLOF.     Follow Up Recommendations Supervision for mobility/OOB;CIR    Equipment Recommendations  None recommended by PT    Recommendations for Other Services       Precautions / Restrictions Precautions Precautions: Fall Restrictions Weight Bearing Restrictions: No      Mobility  Bed Mobility Overal bed mobility: Needs Assistance Bed Mobility: Supine to Sit     Supine to sit: Min assist     General bed mobility comments: with bed rail; does require mod VCs to reach and bring RUE over during rolling for sidelying to sit; Patient needs assistance for lifting RUE;   Transfers Overall transfer level: Needs assistance Equipment used: Hemi-walker Transfers: Sit to/from  [employer] Sit to Stand: Mod assist Stand pivot transfers: Mod assist       General transfer comment: Patient requires mod A for standing x2 reps, however unable to shift weight to RUE due to PF contracture/increased tone; she was mod A for stand pivot without hemi-walker requiring increased assistance for scooting;   Ambulation/Gait             General Gait Details: unable at this time; unable to shift weight to RLE;   Stairs            Wheelchair Mobility    Modified Rankin (Stroke Patients Only)       Balance Overall balance assessment: Needs assistance Sitting-balance support: Single extremity supported;Feet supported Sitting balance-Leahy Scale: Fair Sitting balance - Comments: able to sit with supervision with 1 HHA and feet supported;    Standing balance support: Single extremity supported Standing balance-Leahy Scale: Poor Standing balance comment: requires mod A for standing balance with 1 HHA, demonstrating decreased RLE weight shift with most weight on LLE requiring cues for knee and hip extension to get into full standing;                              Pertinent Vitals/Pain Pain Assessment: No/denies pain    Home Living Family/patient expects to be discharged to:: Private residence Living Arrangements: Children Available Help at Discharge: Family Type of Home: House Home Access: Stairs to enter   [employer] of Steps: unsure number of stairs;  Home  Layout: Two level;Able to live on main level with bedroom/bathroom Home Equipment: Wheelchair - [employer]manual;Walker - 2 wheels;Bedside commode;Tub bench      Prior Function Level of Independence: Needs assistance   Gait / Transfers Assistance Needed: states that she would walk short distances in the home with walker; but was limited;   ADL's / Homemaking Assistance Needed: daughter would help some;         Hand Dominance        Extremity/Trunk Assessment    Upper Extremity Assessment: Defer to OT evaluation           Lower Extremity Assessment: RLE deficits/detail;LLE deficits/detail RLE Deficits / Details: hip and knee AROM is WFL; ankle is limited in DF with PF contracture; strength in hip is 3/5, knee 3-/5, ankle not assessed;  LLE Deficits / Details: grossly 4/5, ROM is WFL; intact light touch and deep pressure;   Cervical / Trunk Assessment: Kyphotic  Communication   Communication: Expressive difficulties  Cognition Arousal/Alertness: Awake/alert Behavior During Therapy: WFL for tasks assessed/performed Overall Cognitive Status: Within Functional Limits for tasks assessed (however has expressive difficulty)                      General Comments      Exercises        Assessment/Plan    PT Assessment Patient needs continued PT services  PT Diagnosis Difficulty walking;Abnormality of gait;Generalized weakness;Hemiplegia dominant side   PT Problem List Decreased strength;Decreased activity tolerance;Decreased range of motion;Decreased balance;Decreased knowledge of use of DME;Decreased coordination;Decreased mobility  PT Treatment Interventions DME instruction;Gait training;Functional mobility training;Neuromuscular re-education;Balance training;Therapeutic exercise;Therapeutic activities;Patient/family education;Stair training   PT Goals (Current goals can be found in the Care Plan section) Acute Rehab PT Goals Patient Stated Goal: Pt unable to verbalize a full goal. However appears to want to be able to stand and walk better PT Goal Formulation: With patient Time For Goal Achievement: 03/23/16 Potential to Achieve Goals: Fair    Frequency 7X/week   Barriers to discharge   unsure of home environment based on patients limited expressive difficulty; however I believe her daughter is at home with her but unsure if she is available during day;     Co-evaluation               End of Session Equipment Utilized  During Treatment: Gait belt Activity Tolerance: Patient tolerated treatment well;No increased pain Patient left: in chair;with call bell/phone within reach;with chair alarm set           Time: [id number] PT Time Calculation (min) (ACUTE ONLY): 22 min   Charges:   PT Evaluation $PT Eval Low Complexity: 1 Procedure     PT G Codes:        Trotter,Margaret PT, DPT 03/09/2016, 11:23 AM

## 2016-03-09 NOTE — Progress Notes (Signed)
[facility] [place] at [facility]                                                                                                                                                                                            Patient Demographics   Alexandria Hanson, is a 82 y.o. female, DOB - 29-Oct-1928, WJX:[id number]  Admit date - 03/07/2016   Admitting Physician Alford Highland, MD  Outpatient Primary MD for the patient is Gabriel Cirri, NP   LOS - 1  Subjective: Still with expressive aphasia cant move right upper ext.     Review of Systems:   CONSTITUTIONAL:Unable to express herself to obtain a proper review of systems cannot write with her left hand.    Vitals:   Vitals:   03/08/16 1433 03/08/16 2138 03/09/16 0506 03/09/16 0729  BP: (!) 158/58 (!) 143/48 (!) 143/48 (!) 144/59  Pulse: 89 86 86 94  Resp: 18   18  Temp: 98.4 F (36.9 C) 97.8 F (36.6 C) 97.3 F (36.3 C) 98.3 F (36.8 C)  TempSrc: Oral Oral Oral   SpO2: 95% 94% 97% 93%  Weight:      Height:        Wt Readings from Last 3 Encounters:  03/08/16 138 lb 12.8 oz (63 kg)     Intake/Output Summary (Last 24 hours) at 03/09/16 1111 Last data filed at 03/09/16 0800  Gross per 24 hour  Intake              240 ml  Output              400 ml  Net             -160 ml    Physical Exam:   GENERAL: Pleasant-appearing in no apparent distress.  HEAD, EYES, EARS, NOSE AND THROAT: Atraumatic, normocephalic. Extraocular muscles are intact. Pupils equal and reactive to light. Sclerae anicteric. No conjunctival injection. No oro-pharyngeal erythema.  NECK: Supple. There is no jugular venous distention. No bruits, no lymphadenopathy, no thyromegaly.  HEART: Regular rate and rhythm,. No murmurs, no rubs, no clicks.  LUNGS: Clear to auscultation bilaterally. No rales or rhonchi. No wheezes.  ABDOMEN: Soft, flat, nontender, nondistended. Has good bowel sounds. No hepatosplenomegaly appreciated.   EXTREMITIES: No evidence of any cyanosis, clubbing, or peripheral edema.  +2 pedal and radial pulses bilaterally.  NEUROLOGIC: Patient with expressive aphasia right upper extremity umable to move SKIN: Moist and warm with no rashes appreciated.  Psych: Not anxious, depressed LN: No inguinal LN enlargement    Antibiotics   Anti-infectives    Start  Dose/Rate Route Frequency Ordered Stop   03/08/16 1300  cefTRIAXone (ROCEPHIN) 1 g in dextrose 5 % 50 mL IVPB     1 g 100 mL/hr over 30 Minutes Intravenous Daily 03/08/16 1101        Medications   Scheduled Meds: . aspirin EC  81 mg Oral Daily  . atorvastatin  40 mg Oral q1800  . cefTRIAXone (ROCEPHIN)  IV  1 g Intravenous Daily  . clopidogrel  75 mg Oral Daily  . enoxaparin (LOVENOX) injection  40 mg Subcutaneous Q24H  . FLUoxetine  20 mg Oral Daily  . potassium chloride  40 mEq Oral TID   Continuous Infusions:  PRN Meds:.acetaminophen **OR** acetaminophen   Data Review:   Micro Results No results found for this or any previous visit (from the past 240 hour(s)).  Radiology Reports Mr Shirlee LatchMra Head ZOWo Contrast  Result Date: 03/08/2016 CLINICAL DATA:  Abnormal speech and right upper extremity weakness after a fall at 10:30 a.m. today. Recent headaches. EXAM: MRI HEAD WITHOUT CONTRAST MRA HEAD WITHOUT CONTRAST TECHNIQUE: Multiplanar, multiecho pulse sequences of the brain and surrounding structures were obtained without intravenous contrast. Angiographic images of the head were obtained using MRA technique without contrast. COMPARISON:  None. FINDINGS: MRI HEAD FINDINGS Acute nonhemorrhagic infarct is present within the posterior left frontal lobe. This involves the left frontal operculum and the precentral gyrus. Maximum dimension of the infarct is 4 cm. T2 changes are present within the area of restricted diffusion. More remote lacunar infarcts are present within the basal ganglia and centrum semi of ally bilaterally, left greater  than right. Asymmetric subcortical white matter disease is present in the left hemisphere. Additional remote lacunar infarcts are present in the basal ganglia and cerebellum bilaterally. White matter changes extend into the brainstem. Flow is present in the major intracranial arteries. The skullbase is within normal limits. A relatively empty sella is present. Midline sagittal structures are otherwise unremarkable. The upper cervical spine is within normal limits. The left cerebral peduncle is smaller than the right suggesting remote ischemia and will layering degeneration. The paranasal sinuses and mastoid air cells are clear. The globes and orbits are intact. Hyperostosis frontalis internus is noted. MRA HEAD FINDINGS The internal carotid arteries are within normal limits from the high cervical segments through the ICA termini bilaterally. Minimal atherosclerotic changes present within the cavernous segments. Moderate stenoses are present in the left A1 segment. There is mild irregularity in the right A1 segment. Mild distal left M1 segment narrowing is present. Moderate segmental narrowing is present within proximal M2 branches on the left without focal occlusion. More mild segmental disease is present on the right. There is irregular narrowing of the ACA branches bilaterally. The left vertebral artery is the dominant vessel. The left PICA origin is visualized and normal. The right AICA is dominant. The basilar artery is within normal limits. Both posterior cerebral arteries originate the basilar tip. Moderate narrowing is present within proximal PCA branch vessels bilaterally. IMPRESSION: 1. Acute nonhemorrhagic infarct within the posterior left frontal lobe involving the left frontal operculum and precentral gyrus. 2. Additional remote lacunar infarcts are present within the centrum semi of ally bilaterally, left greater than right. 3. Asymmetric left-sided white matter disease. 4. Moderate diffuse medium and  small vessel disease as described above. There is asymmetric attenuation of proximal M2 branches on the left compared to the right. 5. Mild distal left M1 segment stenosis. 6. Moderate distal left A1 segment stenosis. 7. Moderate proximal P2 segment  stenoses bilaterally. These results were called by telephone at the time of interpretation on 03/08/2016 at 1:31 pm to Dr. Auburn Bilberry , who verbally acknowledged these results. Electronically Signed   By: Marin Roberts M.D.   On: 03/08/2016 13:32   Mr Brain Wo Contrast  Result Date: 03/08/2016 CLINICAL DATA:  Abnormal speech and right upper extremity weakness after a fall at 10:30 a.m. today. Recent headaches. EXAM: MRI HEAD WITHOUT CONTRAST MRA HEAD WITHOUT CONTRAST TECHNIQUE: Multiplanar, multiecho pulse sequences of the brain and surrounding structures were obtained without intravenous contrast. Angiographic images of the head were obtained using MRA technique without contrast. COMPARISON:  None. FINDINGS: MRI HEAD FINDINGS Acute nonhemorrhagic infarct is present within the posterior left frontal lobe. This involves the left frontal operculum and the precentral gyrus. Maximum dimension of the infarct is 4 cm. T2 changes are present within the area of restricted diffusion. More remote lacunar infarcts are present within the basal ganglia and centrum semi of ally bilaterally, left greater than right. Asymmetric subcortical white matter disease is present in the left hemisphere. Additional remote lacunar infarcts are present in the basal ganglia and cerebellum bilaterally. White matter changes extend into the brainstem. Flow is present in the major intracranial arteries. The skullbase is within normal limits. A relatively empty sella is present. Midline sagittal structures are otherwise unremarkable. The upper cervical spine is within normal limits. The left cerebral peduncle is smaller than the right suggesting remote ischemia and will layering degeneration.  The paranasal sinuses and mastoid air cells are clear. The globes and orbits are intact. Hyperostosis frontalis internus is noted. MRA HEAD FINDINGS The internal carotid arteries are within normal limits from the high cervical segments through the ICA termini bilaterally. Minimal atherosclerotic changes present within the cavernous segments. Moderate stenoses are present in the left A1 segment. There is mild irregularity in the right A1 segment. Mild distal left M1 segment narrowing is present. Moderate segmental narrowing is present within proximal M2 branches on the left without focal occlusion. More mild segmental disease is present on the right. There is irregular narrowing of the ACA branches bilaterally. The left vertebral artery is the dominant vessel. The left PICA origin is visualized and normal. The right AICA is dominant. The basilar artery is within normal limits. Both posterior cerebral arteries originate the basilar tip. Moderate narrowing is present within proximal PCA branch vessels bilaterally. IMPRESSION: 1. Acute nonhemorrhagic infarct within the posterior left frontal lobe involving the left frontal operculum and precentral gyrus. 2. Additional remote lacunar infarcts are present within the centrum semi of ally bilaterally, left greater than right. 3. Asymmetric left-sided white matter disease. 4. Moderate diffuse medium and small vessel disease as described above. There is asymmetric attenuation of proximal M2 branches on the left compared to the right. 5. Mild distal left M1 segment stenosis. 6. Moderate distal left A1 segment stenosis. 7. Moderate proximal P2 segment stenoses bilaterally. These results were called by telephone at the time of interpretation on 03/08/2016 at 1:31 pm to Dr. Auburn Bilberry , who verbally acknowledged these results. Electronically Signed   By: Marin Roberts M.D.   On: 03/08/2016 13:32   US Carotid Bilateral  Result Date: 03/08/2016 CLINICAL DATA:   82 year old female with a history of cerebral vascular accident. Cardiovascular risk factors include a known prior stroke. EXAM: BILATERAL CAROTID DUPLEX ULTRASOUND TECHNIQUE: Wallace Cullens scale imaging, color Doppler and duplex ultrasound were performed of bilateral carotid and vertebral arteries in the neck. COMPARISON:  MR 03/08/2016 FINDINGS:  Criteria: Quantification of carotid stenosis is based on velocity parameters that correlate the residual internal carotid diameter with NASCET-based stenosis levels, using the diameter of the distal internal carotid lumen as the denominator for stenosis measurement. The following velocity measurements were obtained: RIGHT ICA:  Systolic 53 cm/sec, Diastolic 13 cm/sec CCA:  61 cm/sec SYSTOLIC ICA/CCA RATIO:  1.1 ECA:  80 cm/sec LEFT ICA:  Systolic 66 cm/sec, Diastolic 14 cm/sec CCA:  58 cm/sec SYSTOLIC ICA/CCA RATIO:  0.9 ECA:  156 cm/sec Right Brachial SBP: Not acquired Left Brachial SBP: Not acquired RIGHT CAROTID ARTERY: No significant calcifications of the right common carotid artery. Intermediate waveform maintained. Heterogeneous plaque at the right carotid bifurcation. No significant lumen shadowing. Low resistance waveform of the right ICA. No significant tortuosity. RIGHT VERTEBRAL ARTERY: Antegrade flow with low resistance waveform. LEFT CAROTID ARTERY: No significant calcifications of the left common carotid artery. Intermediate waveform maintained. Heterogeneous and partially calcified plaque at the left carotid bifurcation without significant lumen shadowing. Low resistance waveform of the left ICA. No significant tortuosity. LEFT VERTEBRAL ARTERY:  Antegrade flow with low resistance waveform. IMPRESSION: Color duplex indicates minimal heterogeneous and calcified plaque, with no hemodynamically significant stenosis by duplex criteria in the extracranial cerebrovascular circulation. Signed, Yvone Neu. Loreta Ave, DO [facility]  Radiology Electronically Signed   By: Gilmer Mor D.O.   On: 03/08/2016 13:56   Ct Head Code Stroke W/o Cm  Result Date: 03/07/2016 CLINICAL DATA:  Code stroke. Acute onset RIGHT-sided weakness and speech difficulty. History of LEFT subdural hematoma. EXAM: CT HEAD WITHOUT CONTRAST TECHNIQUE: Contiguous axial images were obtained from the base of the skull through the vertex without intravenous contrast. COMPARISON:  CT HEAD December 22, 2010 FINDINGS: BRAIN: No intraparenchymal hemorrhage, mass effect, midline shift or acute large vascular territory infarct. Old LEFT basal ganglia infarct with ex vacuo dilatation LEFT lateral ventricle. Smaller LEFT cerebral peduncle compatible with wallerian degeneration. Old small RIGHT cerebellar infarcts. Stable LEFT posterior fossa arachnoid cyst with mild mass effect. Resolution of LEFT subdural hemorrhage. VASCULAR: Moderate to severe calcific atherosclerosis of the carotid siphons. SKULL: No skull fracture. No significant scalp soft tissue swelling. Hyperostosis frontalis internus. Multiple calcified scalp sebaceous cysts. SINUSES/ORBITS: The included ocular globes and orbital contents are non-suspicious.The mastoid air-cells and included paranasal sinuses are well-aerated. OTHER: None. ASPECTS [facility] Stroke Program Early CT Score) - Ganglionic level infarction (caudate, lentiform nuclei, internal capsule, insula, M1-M3 cortex): 7 - Supraganglionic infarction (M4-M6 cortex): 3 Total score (0-10 with 10 being normal): 10 IMPRESSION: 1. No acute intracranial process. Old LEFT basal ganglia infarct and moderate chronic small vessel ischemic disease. 2. ASPECTS is 10. Acute findings discussed with and reconfirmed by Dr.PHILLIP STAFFORD on 03/07/2016 at 6:24 pm. Electronically Signed   By: Awilda Metro M.D.   On: 03/07/2016 18:26     CBC  Recent Labs Lab 03/07/16 1810 03/08/16 0342  WBC 10.6 8.1  HGB 14.6 13.3  HCT 41.6 37.7  PLT 246 209  MCV 90.6 89.9  MCH  31.8 31.8  MCHC 35.1 35.4  RDW 13.2 13.0  LYMPHSABS 4.8*  --   MONOABS 0.6  --   EOSABS 0.1  --   BASOSABS 0.1  --     Chemistries   Recent Labs Lab 03/07/16 1810 03/08/16 0342 03/09/16 0408  NA 140 141 141  K 3.1* 2.7* 3.0*  CL 102 103 107  CO2 27 26 25   GLUCOSE 101* 101* 105*  BUN 19 16 14  CREATININE 0.78 0.73 0.67  CALCIUM 9.3 8.9 9.0  AST 33  --   --   ALT 25  --   --   ALKPHOS 73  --   --   BILITOT 1.1  --   --    ------------------------------------------------------------------------------------------------------------------ estimated creatinine clearance is 45.9 mL/min (by C-G formula based on SCr of 0.8 mg/dL). ------------------------------------------------------------------------------------------------------------------ No results for input(s): HGBA1C in the last 72 hours. ------------------------------------------------------------------------------------------------------------------ No results for input(s): CHOL, HDL, LDLCALC, TRIG, CHOLHDL, LDLDIRECT in the last 72 hours. ------------------------------------------------------------------------------------------------------------------ No results for input(s): TSH, T4TOTAL, T3FREE, THYROIDAB in the last 72 hours.  Invalid input(s): FREET3 ------------------------------------------------------------------------------------------------------------------ No results for input(s): VITAMINB12, FOLATE, FERRITIN, TIBC, IRON, RETICCTPCT in the last 72 hours.  Coagulation profile  Recent Labs Lab 03/07/16 1810  INR 0.98    No results for input(s): DDIMER in the last 72 hours.  Cardiac Enzymes  Recent Labs Lab 03/07/16 1810  TROPONINI <0.03   ------------------------------------------------------------------------------------------------------------------ Invalid input(s): POCBNP    Assessment & Plan   Pt is 82 year old with expressive aphasia and right-sided weakness  1. 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 Hyperlipidemia continue atorvastatin  3, urinary tract infection treated with IV ceftriaxone  cx pending  4. Hypokalemia potassium being replaced  5. Hyperlipidemia continue therapy with atorvastatin   dispo pt will need to go to rehab    Code Status Orders        Start     Ordered   03/07/16 2005  Do not attempt resuscitation (DNR)  Continuous    Question Answer Comment  In the event of cardiac or respiratory ARREST Do not call a "code blue"   In the event of cardiac or respiratory ARREST Do not perform Intubation, CPR, defibrillation or ACLS   In the event of cardiac or respiratory ARREST Use medication by any route, position, wound care, and other measures to relive pain and suffering. May use oxygen, suction and manual treatment of airway obstruction as needed for comfort.   Comments nurse may pronounce      03/07/16 2004    Code Status History    Date Active Date Inactive Code Status Order ID Comments User Context   03/07/2016  8:04 PM 03/08/2016 11:21 AM DNR [id number]  Alford Highland, MD ED    Questions for Most Recent Historical Code Status (Order [id number])    Question Answer Comment   In the event of cardiac or respiratory ARREST Do not call a "code blue"    In the event of cardiac or respiratory ARREST Do not perform Intubation, CPR, defibrillation or ACLS    In the event of cardiac or respiratory ARREST Use medication by any route, position, wound care, and other measures to relive pain and suffering. May use oxygen, suction and manual treatment of airway obstruction as needed for comfort.    Comments nurse may pronounce            Consultsnone   DVT Prophylaxis  Lovenox   Lab Results  Component Value Date   PLT 209 03/08/2016     Time Spent in minutes   Greater than 50% of time spent in care  coordination and counseling patient regarding the condition and plan of care.   Auburn Bilberry M.D on 03/09/2016 at 11:11 AM  Between 7am to 6pm - Pager - [phone]  After 6pm go to [email or web address] - password  [facility]  [facility]   Office  [phone]

## 2016-03-09 NOTE — Progress Notes (Signed)
Rehab Admissions Coordinator Note:  Patient was screened by Trish MageLogue, Merrell Rettinger M for appropriateness for an Inpatient Acute Rehab Consult.  At this time, we are recommending Inpatient Rehab consult.  My partner, Darl PikesSusan, will follow up tomorrow.  Trish MageLogue, Cassi Jenne M 03/09/2016, 3:41 PM  I can be reached at [phone]

## 2016-03-10 LAB — BASIC METABOLIC PANEL
Anion gap: 6 (ref 5–15)
BUN: 15 mg/dL (ref 6–20)
CALCIUM: 9 mg/dL (ref 8.9–10.3)
CHLORIDE: 113 mmol/L — AB (ref 101–111)
CO2: 23 mmol/L (ref 22–32)
CREATININE: 0.68 mg/dL (ref 0.44–1.00)
GFR calc non Af Amer: >60 mL/min (ref >60)
Glucose, Bld: 91 mg/dL (ref 65–99)
Potassium: 4.8 mmol/L (ref 3.5–5.1)
SODIUM: 142 mmol/L (ref 135–145)

## 2016-03-10 LAB — URINE CULTURE: Culture: >=100000 — AB

## 2016-03-10 MED ORDER — CEFUROXIME AXETIL 250 MG PO TABS
250.0000 mg | ORAL_TABLET | Freq: Two times a day (BID) | ORAL | Status: DC
  Administered 2016-03-11: 250 mg via ORAL
  Filled 2016-03-10 (×2): qty 1

## 2016-03-10 MED ORDER — HYDROCHLOROTHIAZIDE 25 MG PO TABS
25.0000 mg | ORAL_TABLET | Freq: Every day | ORAL | Status: DC
  Administered 2016-03-10 – 2016-03-11 (×2): 25 mg via ORAL
  Filled 2016-03-10 (×2): qty 1

## 2016-03-10 NOTE — Progress Notes (Signed)
[facility] [place] [facility]                                                                                                                                                                                            Patient Demographics   Alexandria Hanson, is a 82 y.o. female, DOB - 1929-05-13, ZOX:[id number]  Admit date - 03/07/2016   Admitting Physician Alford Highlandichard Wieting, MD  Outpatient Primary MD for the patient is Gabriel Cirriheryl Wicker, NP   LOS - 2  Subjective: Unable to express herself Some movement of the right upper extremity     Review of Systems:   CONSTITUTIONAL:Unable to express herself  Vitals:   Vitals:   03/09/16 2100 03/10/16 0222 03/10/16 0750 03/10/16 1222  BP: (!) 146/51 (!) 146/61 (!) 157/70 (!) 144/55  Pulse: 78 81 84 94  Resp: 18 18 16 18   Temp: 98.3 F (36.8 C) 98.1 F (36.7 C) 97.9 F (36.6 C) 98.7 F (37.1 C)  TempSrc: Oral Oral Oral   SpO2: 95% 96% 92% 97%  Weight:      Height:        Wt Readings from Last 3 Encounters:  03/08/16 138 lb 12.8 oz (63 kg)     Intake/Output Summary (Last 24 hours) at 03/10/16 1259 Last data filed at 03/10/16 1006  Gross per 24 hour  Intake              410 ml  Output              200 ml  Net              210 ml    Physical Exam:   GENERAL: Pleasant-appearing in no apparent distress.  HEAD, EYES, EARS, NOSE AND THROAT: Atraumatic, normocephalic. Extraocular muscles are intact. Pupils equal and reactive to light. Sclerae anicteric. No conjunctival injection. No oro-pharyngeal erythema.  NECK: Supple. There is no jugular venous distention. No bruits, no lymphadenopathy, no thyromegaly.  HEART: Regular rate and rhythm,. No murmurs, no rubs, no clicks.  LUNGS: Clear to auscultation bilaterally. No rales or rhonchi. No wheezes.  ABDOMEN: Soft, flat, nontender, nondistended. Has good bowel sounds. No hepatosplenomegaly appreciated.  EXTREMITIES: No evidence of any cyanosis, clubbing, or  peripheral edema.  +2 pedal and radial pulses bilaterally.  NEUROLOGIC: Patient with expressive aphasia right upper extremity contracture SKIN: Moist and warm with no rashes appreciated.  Psych: Not anxious, depressed LN: No inguinal LN enlargement    Antibiotics   Anti-infectives    Start     Dose/Rate Route Frequency Ordered Stop   03/08/16 1300  cefTRIAXone (  ROCEPHIN) 1 g in dextrose 5 % 50 mL IVPB     1 g 100 mL/hr over 30 Minutes Intravenous Daily 03/08/16 1101        Medications   Scheduled Meds: . aspirin EC  81 mg Oral Daily  . atorvastatin  40 mg Oral q1800  . cefTRIAXone (ROCEPHIN)  IV  1 g Intravenous Daily  . clopidogrel  75 mg Oral Daily  . enoxaparin (LOVENOX) injection  40 mg Subcutaneous Q24H  . FLUoxetine  20 mg Oral Daily   Continuous Infusions:  PRN Meds:.acetaminophen **OR** acetaminophen   Data Review:   Micro Results Recent Results (from the past 240 hour(s))  Urine culture     Status: Abnormal   Collection Time: 03/07/16  8:21 PM  Result Value Ref Range Status   Specimen Description URINE, RANDOM  Final   Special Requests NONE  Final   Culture >=100,000 COLONIES/mL ESCHERICHIA COLI (A)  Final   Report Status 03/10/2016 FINAL  Final   Organism ID, Bacteria ESCHERICHIA COLI (A)  Final      Susceptibility   Escherichia coli - MIC*    AMPICILLIN 4 SENSITIVE Sensitive     CEFAZOLIN <=4 SENSITIVE Sensitive     CEFTRIAXONE <=1 SENSITIVE Sensitive     CIPROFLOXACIN <=0.25 SENSITIVE Sensitive     GENTAMICIN <=1 SENSITIVE Sensitive     IMIPENEM <=0.25 SENSITIVE Sensitive     NITROFURANTOIN <=16 SENSITIVE Sensitive     TRIMETH/SULFA <=20 SENSITIVE Sensitive     AMPICILLIN/SULBACTAM <=2 SENSITIVE Sensitive     PIP/TAZO <=4 SENSITIVE Sensitive     Extended ESBL NEGATIVE Sensitive     * >=100,000 COLONIES/mL ESCHERICHIA COLI    Radiology Reports Mr [name] ZO Contrast  Result Date: 03/08/2016 CLINICAL DATA:  Abnormal speech and right upper  extremity weakness after a fall at 10:30 a.m. today. Recent headaches. EXAM: MRI HEAD WITHOUT CONTRAST MRA HEAD WITHOUT CONTRAST TECHNIQUE: Multiplanar, multiecho pulse sequences of the brain and surrounding structures were obtained without intravenous contrast. Angiographic images of the head were obtained using MRA technique without contrast. COMPARISON:  None. FINDINGS: MRI HEAD FINDINGS Acute nonhemorrhagic infarct is present within the posterior left frontal lobe. This involves the left frontal operculum and the precentral gyrus. Maximum dimension of the infarct is 4 cm. T2 changes are present within the area of restricted diffusion. More remote lacunar infarcts are present within the basal ganglia and centrum semi of ally bilaterally, left greater than right. Asymmetric subcortical white matter disease is present in the left hemisphere. Additional remote lacunar infarcts are present in the basal ganglia and cerebellum bilaterally. White matter changes extend into the brainstem. Flow is present in the major intracranial arteries. The skullbase is within normal limits. A relatively empty sella is present. Midline sagittal structures are otherwise unremarkable. The upper cervical spine is within normal limits. The left cerebral peduncle is smaller than the right suggesting remote ischemia and will layering degeneration. The paranasal sinuses and mastoid air cells are clear. The globes and orbits are intact. Hyperostosis frontalis internus is noted. MRA HEAD FINDINGS The internal carotid arteries are within normal limits from the high cervical segments through the ICA termini bilaterally. Minimal atherosclerotic changes present within the cavernous segments. Moderate stenoses are present in the left A1 segment. There is mild irregularity in the right A1 segment. Mild distal left M1 segment narrowing is present. Moderate segmental narrowing is present within proximal M2 branches on the left without focal occlusion.  More mild segmental  disease is present on the right. There is irregular narrowing of the ACA branches bilaterally. The left vertebral artery is the dominant vessel. The left PICA origin is visualized and normal. The right AICA is dominant. The basilar artery is within normal limits. Both posterior cerebral arteries originate the basilar tip. Moderate narrowing is present within proximal PCA branch vessels bilaterally. IMPRESSION: 1. Acute nonhemorrhagic infarct within the posterior left frontal lobe involving the left frontal operculum and precentral gyrus. 2. Additional remote lacunar infarcts are present within the centrum semi of ally bilaterally, left greater than right. 3. Asymmetric left-sided white matter disease. 4. Moderate diffuse medium and small vessel disease as described above. There is asymmetric attenuation of proximal M2 branches on the left compared to the right. 5. Mild distal left M1 segment stenosis. 6. Moderate distal left A1 segment stenosis. 7. Moderate proximal P2 segment stenoses bilaterally. These results were called by telephone at the time of interpretation on 03/08/2016 at 1:31 pm to Dr. Auburn Bilberry , who verbally acknowledged these results. Electronically Signed   By: Marin Roberts M.D.   On: 03/08/2016 13:32   Mr Brain Wo Contrast  Result Date: 03/08/2016 CLINICAL DATA:  Abnormal speech and right upper extremity weakness after a fall at 10:30 a.m. today. Recent headaches. EXAM: MRI HEAD WITHOUT CONTRAST MRA HEAD WITHOUT CONTRAST TECHNIQUE: Multiplanar, multiecho pulse sequences of the brain and surrounding structures were obtained without intravenous contrast. Angiographic images of the head were obtained using MRA technique without contrast. COMPARISON:  None. FINDINGS: MRI HEAD FINDINGS Acute nonhemorrhagic infarct is present within the posterior left frontal lobe. This involves the left frontal operculum and the precentral gyrus. Maximum dimension of the infarct is 4 cm.  T2 changes are present within the area of restricted diffusion. More remote lacunar infarcts are present within the basal ganglia and centrum semi of ally bilaterally, left greater than right. Asymmetric subcortical white matter disease is present in the left hemisphere. Additional remote lacunar infarcts are present in the basal ganglia and cerebellum bilaterally. White matter changes extend into the brainstem. Flow is present in the major intracranial arteries. The skullbase is within normal limits. A relatively empty sella is present. Midline sagittal structures are otherwise unremarkable. The upper cervical spine is within normal limits. The left cerebral peduncle is smaller than the right suggesting remote ischemia and will layering degeneration. The paranasal sinuses and mastoid air cells are clear. The globes and orbits are intact. Hyperostosis frontalis internus is noted. MRA HEAD FINDINGS The internal carotid arteries are within normal limits from the high cervical segments through the ICA termini bilaterally. Minimal atherosclerotic changes present within the cavernous segments. Moderate stenoses are present in the left A1 segment. There is mild irregularity in the right A1 segment. Mild distal left M1 segment narrowing is present. Moderate segmental narrowing is present within proximal M2 branches on the left without focal occlusion. More mild segmental disease is present on the right. There is irregular narrowing of the ACA branches bilaterally. The left vertebral artery is the dominant vessel. The left PICA origin is visualized and normal. The right AICA is dominant. The basilar artery is within normal limits. Both posterior cerebral arteries originate the basilar tip. Moderate narrowing is present within proximal PCA branch vessels bilaterally. IMPRESSION: 1. Acute nonhemorrhagic infarct within the posterior left frontal lobe involving the left frontal operculum and precentral gyrus. 2. Additional  remote lacunar infarcts are present within the centrum semi of ally bilaterally, left greater than right. 3. Asymmetric left-sided  white matter disease. 4. Moderate diffuse medium and small vessel disease as described above. There is asymmetric attenuation of proximal M2 branches on the left compared to the right. 5. Mild distal left M1 segment stenosis. 6. Moderate distal left A1 segment stenosis. 7. Moderate proximal P2 segment stenoses bilaterally. These results were called by telephone at the time of interpretation on 03/08/2016 at 1:31 pm to Dr. Auburn Bilberry , who verbally acknowledged these results. Electronically Signed   By: Marin Roberts M.D.   On: 03/08/2016 13:32   US Carotid Bilateral  Result Date: 03/08/2016 CLINICAL DATA:  82 year old female with a history of cerebral vascular accident. Cardiovascular risk factors include a known prior stroke. EXAM: BILATERAL CAROTID DUPLEX ULTRASOUND TECHNIQUE: Wallace Cullens scale imaging, color Doppler and duplex ultrasound were performed of bilateral carotid and vertebral arteries in the neck. COMPARISON:  MR 03/08/2016 FINDINGS: Criteria: Quantification of carotid stenosis is based on velocity parameters that correlate the residual internal carotid diameter with NASCET-based stenosis levels, using the diameter of the distal internal carotid lumen as the denominator for stenosis measurement. The following velocity measurements were obtained: RIGHT ICA:  Systolic 53 cm/sec, Diastolic 13 cm/sec CCA:  61 cm/sec SYSTOLIC ICA/CCA RATIO:  1.1 ECA:  80 cm/sec LEFT ICA:  Systolic 66 cm/sec, Diastolic 14 cm/sec CCA:  58 cm/sec SYSTOLIC ICA/CCA RATIO:  0.9 ECA:  156 cm/sec Right Brachial SBP: Not acquired Left Brachial SBP: Not acquired RIGHT CAROTID ARTERY: No significant calcifications of the right common carotid artery. Intermediate waveform maintained. Heterogeneous plaque at the right carotid bifurcation. No significant lumen shadowing. Low resistance waveform of the  right ICA. No significant tortuosity. RIGHT VERTEBRAL ARTERY: Antegrade flow with low resistance waveform. LEFT CAROTID ARTERY: No significant calcifications of the left common carotid artery. Intermediate waveform maintained. Heterogeneous and partially calcified plaque at the left carotid bifurcation without significant lumen shadowing. Low resistance waveform of the left ICA. No significant tortuosity. LEFT VERTEBRAL ARTERY:  Antegrade flow with low resistance waveform. IMPRESSION: Color duplex indicates minimal heterogeneous and calcified plaque, with no hemodynamically significant stenosis by duplex criteria in the extracranial cerebrovascular circulation. Signed, Yvone Neu. Loreta Ave, DO [facility] Electronically Signed   By: Gilmer Mor D.O.   On: 03/08/2016 13:56   Ct Head Code Stroke W/o Cm  Result Date: 03/07/2016 CLINICAL DATA:  Code stroke. Acute onset RIGHT-sided weakness and speech difficulty. History of LEFT subdural hematoma. EXAM: CT HEAD WITHOUT CONTRAST TECHNIQUE: Contiguous axial images were obtained from the base of the skull through the vertex without intravenous contrast. COMPARISON:  CT HEAD December 22, 2010 FINDINGS: BRAIN: No intraparenchymal hemorrhage, mass effect, midline shift or acute large vascular territory infarct. Old LEFT basal ganglia infarct with ex vacuo dilatation LEFT lateral ventricle. Smaller LEFT cerebral peduncle compatible with wallerian degeneration. Old small RIGHT cerebellar infarcts. Stable LEFT posterior fossa arachnoid cyst with mild mass effect. Resolution of LEFT subdural hemorrhage. VASCULAR: Moderate to severe calcific atherosclerosis of the carotid siphons. SKULL: No skull fracture. No significant scalp soft tissue swelling. Hyperostosis frontalis internus. Multiple calcified scalp sebaceous cysts. SINUSES/ORBITS: The included ocular globes and orbital contents are non-suspicious.The mastoid air-cells  and included paranasal sinuses are well-aerated. OTHER: None. ASPECTS [facility] Stroke Program Early CT Score) - Ganglionic level infarction (caudate, lentiform nuclei, internal capsule, insula, M1-M3 cortex): 7 - Supraganglionic infarction (M4-M6 cortex): 3 Total score (0-10 with 10 being normal): 10 IMPRESSION: 1. No acute intracranial process. Old LEFT basal ganglia infarct and moderate chronic small  vessel ischemic disease. 2. ASPECTS is 10. Acute findings discussed with and reconfirmed by Dr.PHILLIP STAFFORD on 03/07/2016 at 6:24 pm. Electronically Signed   By: Awilda Metro M.D.   On: 03/07/2016 18:26     CBC  Recent Labs Lab 03/07/16 1810 03/08/16 0342  WBC 10.6 8.1  HGB 14.6 13.3  HCT 41.6 37.7  PLT 246 209  MCV 90.6 89.9  MCH 31.8 31.8  MCHC 35.1 35.4  RDW 13.2 13.0  LYMPHSABS 4.8*  --   MONOABS 0.6  --   EOSABS 0.1  --   BASOSABS 0.1  --     Chemistries   Recent Labs Lab 03/07/16 1810 03/08/16 0342 03/09/16 0408 03/10/16 0411  NA 140 141 141 142  K 3.1* 2.7* 3.0* 4.8  CL 102 103 107 113*  CO2 27 26 25 23   GLUCOSE 101* 101* 105* 91  BUN 19 16 14 15   CREATININE 0.78 0.73 0.67 0.68  CALCIUM 9.3 8.9 9.0 9.0  AST 33  --   --   --   ALT 25  --   --   --   ALKPHOS 73  --   --   --   BILITOT 1.1  --   --   --    ------------------------------------------------------------------------------------------------------------------ estimated creatinine clearance is 45.9 mL/min (by C-G formula based on SCr of 0.8 mg/dL). ------------------------------------------------------------------------------------------------------------------ No results for input(s): HGBA1C in the last 72 hours. ------------------------------------------------------------------------------------------------------------------ No results for input(s): CHOL, HDL, LDLCALC, TRIG, CHOLHDL, LDLDIRECT in the last 72  hours. ------------------------------------------------------------------------------------------------------------------ No results for input(s): TSH, T4TOTAL, T3FREE, THYROIDAB in the last 72 hours.  Invalid input(s): FREET3 ------------------------------------------------------------------------------------------------------------------ No results for input(s): VITAMINB12, FOLATE, FERRITIN, TIBC, IRON, RETICCTPCT in the last 72 hours.  Coagulation profile  Recent Labs Lab 03/07/16 1810  INR 0.98    No results for input(s): DDIMER in the last 72 hours.  Cardiac Enzymes  Recent Labs Lab 03/07/16 1810  TROPONINI <0.03   ------------------------------------------------------------------------------------------------------------------ Invalid input(s): POCBNP    Assessment & Plan   Pt is 82 year old with expressive aphasia and right-sided weakness  1. 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 Hyperlipidemia continue atorvastatin  3, urinary tract infection treated with IV ceftriaxone  changed to Ceftin twice a day 4. Hypokalemia potassium being replaced  5. Hyperlipidemia replaced   dispo pt will need to go to rehab    Code Status Orders        Start     Ordered   03/07/16 2005  Do not attempt resuscitation (DNR)  Continuous    Question Answer Comment  In the event of cardiac or respiratory ARREST Do not call a "code blue"   In the event of cardiac or respiratory ARREST Do not perform Intubation, CPR, defibrillation or ACLS   In the event of cardiac or respiratory ARREST Use medication by any route, position, wound care, and other measures to relive pain and suffering. May use oxygen, suction and manual treatment of airway obstruction as needed for comfort.    Comments nurse may pronounce      03/07/16 2004    Code Status History    Date Active Date Inactive Code Status Order ID Comments User Context   03/07/2016  8:04 PM 03/08/2016 11:21 AM DNR [id number]  Alford Highland, MD ED    Questions for  Most Recent Historical Code Status (Order [id number])    Question Answer Comment   In the event of cardiac or respiratory ARREST Do not call a "code blue"    In the event of cardiac or respiratory ARREST Do not perform Intubation, CPR, defibrillation or ACLS    In the event of cardiac or respiratory ARREST Use medication by any route, position, wound care, and other measures to relive pain and suffering. May use oxygen, suction and manual treatment of airway obstruction as needed for comfort.    Comments nurse may pronounce            Consultsnone   DVT Prophylaxis  Lovenox   Lab Results  Component Value Date   PLT 209 03/08/2016     Time Spent in minutes   Greater than 50% of time spent in care coordination and counseling patient regarding the condition and plan of care.   Auburn Bilberry M.D on 03/10/2016 at 12:59 PM  Between 7am to 6pm - Pager - [phone]  After 6pm go to [email or web address] - password [email or web address] [facility]  [facility]   Office  [phone]

## 2016-03-10 NOTE — Care Management (Signed)
Spoke with daughter, Alexandria Hanson, at the bedside. Cell number is [phone]) States her mother has lived at her home since 2000. States her mother had a stroke in 2000 and [facility]. No rehab. Following this stroke. Daughter states her foot has drug since the stroke in 2000. "They have mentioned a foot/leg brace, but this didn't happen." Rolling walker and bedside commode in the home.  [facility] 6 months ago. No falls. Really good appetite. No home Health. No skilled facility. No home oxygen. Pres[place] [facility] [place] Physical therapy evaluation completed. Recommended Inpatient Acute Rehab. Discussed Inpatient Acute Rehab with daughter. States that she wants her mother to stay local so she can visit every day, She wants her [facility] to [facility]. The granddaughter works at [employer] and could see her everyday, Will update Darl PikesSusan at [facility] [place]. Will update Marketing executiveChristina Sunkins Clinical Social Worker. Will update Dr. Allena KatzPatel. Gwenette GreetBrenda S Ajax Schroll RN MSN CCM Care Management [phone]

## 2016-03-10 NOTE — Evaluation (Signed)
Speech Language Pathology Evaluation Patient Details Name: Alexandria Hanson MRN: [id number] DOB: Feb 02, 1929 Today's Date: 03/10/2016 Time: [id number] SLP Time Calculation (min) (ACUTE ONLY): 60 min  Problem List:  Patient Active Problem List   Diagnosis Date Noted  . Stroke [facility](HCC) 03/08/2016   Past Medical History:  Past Medical History:  Diagnosis Date  . MI (mitral incompetence)   . Stroke [facility](HCC)    Past Surgical History:  Past Surgical History:  Procedure Laterality Date  . ABDOMINAL HYSTERECTOMY    . APPENDECTOMY    . CHOLECYSTECTOMY     HPI:  Pt is a 82 y.o. female w/ h/o MI and stroke. At 10:30 AM patient had a fall. Later when they checked at her she had no mobility in her right arm and her speech was slurred. She knows what she wants to say but unable to say it. Last week she did have a headache for 3 days. Code stroke was called. A she was not in the TPA window. She does take aspirin at home. Hospitalists were contacted for admission. Patient has a history of stroke in the past with baseline right-sided weakness. Patient unable to talk in complete sentences clearly. Pt continues to present w/ expressive language deficits; able to indicate wants/needs per NSG and family. Pt and family denied any prior difficulty swallowing before admission. Slight labial decreased tone noted on R side.    Assessment / Plan / Recommendation Clinical Impression  Pt seen today during tx session for ongoing assessment of expressive aphasia. Pt continues to present with expressive language deficits which impede her ability to make herself understood using verbal communication. Pt's auditory comprehension continues to appear [facility], but can be impacted by expressive language deficits to the listener. Pt appears to present with adequate receptive language skills and moderate-severe expressive language deficits. Pt was able to complete most automatic speech tasks, naming days of the week, months of the year,  counting to 10, ABC's, and singing "Happy Birthday" with moderate cues required. During automatic speech tasks phonemic cues were beneficial for the client's word expression. The client was able to complete naming tasks and sentance completion tasks given moderate cues. Cues given were phonemic and given two choices. Pt was able to give biographical information given minimal cues, using the same strategies. Pt was able to read outloud single and 2-3 word phrases given minimal phonemic cues. Pt was able to convey she understood what she was reading, but had difficulty "getting it out".  Strategies found to be most effective for Pt were asking Yes/No questions, Phonemic cues and giving pt a choice of two answers. Pt was more aware of expressive speech deficits and would become visually frusterated. Pt's Expressive Aphasia is an interfering factor impeding pt's communication abilitites. Pt would benefit from skilled ST services to improve effective communication strategies for ADLs.     SLP Assessment  Patient needs continued Speech Lanaguage Pathology Services    Follow Up Recommendations  Home health SLP;Outpatient SLP;Inpatient Rehab;Skilled Nursing facility    Frequency and Duration min 3x week  2 weeks      SLP Evaluation Prior Functioning  Cognitive/Linguistic Baseline: Within functional limits Type of Home: House  Lives With: Family Available Help at Discharge: Family Vocation: Retired   IT consultantCognition  Overall Cognitive Status: Within Functional Limits for tasks assessed Arousal/Alertness: Awake/alert Orientation Level: Oriented X4 Attention: Focused;Sustained Focused Attention: Appears intact Sustained Attention: Appears intact Memory: Appears intact Awareness: Appears intact Problem Solving: Appears intact Executive Function: Initiating;Decision Making Decision  Making: Appears intact Initiating: Impaired Initiating Impairment: Verbal basic (requires moderate cues) Safety/Judgment:  Appears intact    Comprehension  Auditory Comprehension Overall Auditory Comprehension: Appears within functional limits for tasks assessed Yes/No Questions: Within Functional Limits Commands: Within Functional Limits Conversation: Simple Interfering Components: Motor planning EffectiveTechniques: Extra processing time;Repetition;Stressing words (Phonemic cues, giving two choices ) Visual Recognition/Discrimination Discrimination: Within Function Limits Reading Comprehension Reading Status: Within funtional limits    Expression Expression Primary Mode of Expression: Verbal (Gestures - yes/no) Verbal Expression Overall Verbal Expression: Impaired Initiation: Impaired (dysfluencies noted during intitiantion) Automatic Speech: Name;Counting;Day of week;Month of year;Singing;Social Response Level of Generative/Spontaneous Verbalization: Word Repetition: Impaired Level of Impairment: Word level Naming: Impairment Responsive: 76-100% accurate (With moderate phonemic cues and given two choices) Other Naming Comments: Phonemic cues and giving two choices aided word expression Verbal Errors: Semantic paraphasias;Phonemic paraphasias;Neologisms;Jargon;Aware of errors (Pt aware her words are unclear, visual frustration noted,) Pragmatics: No impairment Interfering Components: Speech intelligibility Effective Techniques: Semantic cues;Phonemic cues (Given two choices) Non-Verbal Means of Communication: Gestures;Communication board Other Verbal Expression Comments: Pt will benefit from using y/n questions and being given two choices Written Expression Dominant Hand: Left Written Expression: Not tested   Oral / Motor  Oral Motor/Sensory Function Overall Oral Motor/Sensory Function: Mild impairment Facial ROM: Reduced right Facial Symmetry: Abnormal symmetry right Facial Strength: Reduced right Facial Sensation: Reduced right Lingual ROM: Within Functional Limits Lingual Symmetry: Within  Functional Limits Lingual Strength: Within Functional Limits Lingual Sensation: Within Functional Limits Velum: Within Functional Limits Mandible: Within Functional Limits Motor Speech Overall Motor Speech: Impaired at baseline Respiration: Within functional limits Phonation: Normal Resonance: Within functional limits Articulation: Impaired Level of Impairment: Word Intelligibility: Intelligibility reduced Word: 25-49% accurate Phrase: 25-49% accurate Sentence: 25-49% accurate Conversation: Not tested Motor Planning: Impaired Level of Impairment: Word Motor Speech Errors: Aware;Groping for words;Consistent Effective Techniques: Pause;Slow rate   GO                    Altamese Dilling B.A. Clinical Graduate Student 03/10/2016, 11:20 AM   This information has been reviewed and agreed upon by this supervising clinician.  Jerilynn Som, MS, CCC-SLP

## 2016-03-10 NOTE — Progress Notes (Signed)
Physical Therapy Treatment Patient Details Name: Alexandria Hanson MRN: [id number] DOB: 06/23/1929 Today's Date: 03/10/2016    History of Present Illness Alexandria Hanson  is a 82 y.o. female. At 10:30 AM patient had a fall. Later when they checked at her she had no mobility in her right arm and her speech was slurred. She knows what she wants to say but unable to say it. Patient was diagnosed with Acute L CVA.    PT Comments    Pt appearing very motivated to participate in PT during PT session.  Pt had difficulty keeping to "yes" or "no" questions d/t pt wanting to explain answer instead but difficult to understand pt d/t expressive aphasia.  Pt had difficulty following more complex multi-step commands but did better with simple commands.  Pt demonstrating posterior lean with standing and difficulty shifting weight to R LE in standing (pt with R LE PF contracture).  Pt able to transfer stand pivot bed to recliner with mod assist and cueing for positioning and technique.  Will continue to progress pt with bed mobility, transfers, balance, and strengthening to decrease assist needs with functional mobility.   Follow Up Recommendations  CIR     Equipment Recommendations   (TBD)    Recommendations for Other Services       Precautions / Restrictions Precautions Precautions: Fall Restrictions Weight Bearing Restrictions: No    Mobility  Bed Mobility Overal bed mobility: Needs Assistance Bed Mobility: Supine to Sit     Supine to sit: Min assist;Mod assist     General bed mobility comments: assist for trunk (and R UE for safety); vc's for technique  Transfers Overall transfer level: Needs assistance Equipment used: Hemi-walker Transfers: Sit to/from [employer] CorporationStand;Stand Pivot Transfers Sit to Stand: Mod assist;Max assist Stand pivot transfers: Mod assist       General transfer comment: Mod to max assist x2 standing with L hemiwalker (pt with difficulty shifting weight to R side d/t R LE PF  contracture; pt also with moderate posterior lean with standing); mod assist stand pivot bed to recliner (to L) with vc's for positioning and technique required  Ambulation/Gait             General Gait Details: Not appropriate at this time   Stairs            Wheelchair Mobility    Modified Rankin (Stroke Patients Only)       Balance Overall balance assessment: Needs assistance Sitting-balance support: Single extremity supported;Feet supported (L UE supporting) Sitting balance-Leahy Scale: Fair       Standing balance-Leahy Scale: Poor Standing balance comment: posterior lean with standing noted and with difficulty shifting weight onto R LE                    Cognition Arousal/Alertness: Awake/alert Behavior During Therapy: WFL for tasks assessed/performed Overall Cognitive Status: Difficult to assess                      Exercises General Exercises - Lower Extremity Ankle Circles/Pumps: AROM;Strengthening;Left;10 reps;Supine Short Arc Quad: AAROM;AROM;Strengthening;Both;10 reps;Supine Heel Slides: Strengthening;Both;10 reps;Supine (AROM L; AAROM R) Hip ABduction/ADduction: Strengthening;Both;10 reps;Supine (AROM L; AAROM R)    General Comments General comments (skin integrity, edema, etc.): Pt agreeable to PT session.      Pertinent Vitals/Pain Pain Assessment: Faces Pain Score: 0-No pain  HR mostly 94 to 114 bpm (did increase to 124 bpm going supine to sit but decreased with rest); O2  WFL on room air.    Home Living                      Prior Function            PT Goals (current goals can now be found in the care plan section) Acute Rehab PT Goals Patient Stated Goal: Pt unable to verbalize full sentence for goal but when asked if she wants to work on improving transfers she nodded yes PT Goal Formulation: With patient Time For Goal Achievement: 03/23/16 Potential to Achieve Goals: Fair Progress towards PT goals:  Progressing toward goals    Frequency  7X/week    PT Plan Current plan remains appropriate    Co-evaluation             End of Session Equipment Utilized During Treatment: Gait belt Activity Tolerance: Patient tolerated treatment well Patient left: in chair;with call bell/phone within reach;with chair alarm set     Time: 2130-8657 PT Time Calculation (min) (ACUTE ONLY): 38 min  Charges:  $Therapeutic Exercise: 23-37 mins $Therapeutic Activity: 8-22 mins                    G CodesHendricks Limes 03-19-2016, 3:28 PM Hendricks Limes, PT [phone]

## 2016-03-10 NOTE — Progress Notes (Signed)
Occupational Therapy Treatment Patient Details Name: Alexandria Hanson MRN: [id number] DOB: 11-01-1928 Today's Date: 03/10/2016    History of present illness Alexandria Hanson  is a 82 y.o. female. At 10:30 AM patient had a fall. Later when they checked at her she had no mobility in her right arm and her speech was slurred. She knows what she wants to say but unable to say it. Patient was diagnosed with Acute L CVA.   OT comments  Pt seen for hand to mouth tasks and one handed tech using L non dominant hand since RUE and hand has minimal active movement.  Pt is motivated to regain functional use of RUE and hand.  Replaced rolled up pillowcase with rolled up washcloth to increase stretch in web space and fingers on R hand.  Neuromuscular re-ed to RUE and hand with minimal active movement during purposeful task.  Pt could benefit from CIR but SW indicated family only wants SNF.  Called and updated PT.  Continue OT for neuromuscular re-ed, ADL retraining, one handed tech, family ed and training.  Follow Up Recommendations  SNF (pt would benefit from CIR but SW indicated family only wants SNF)    Equipment Recommendations       Recommendations for Other Services      Precautions / Restrictions Precautions Precautions: Fall Restrictions Weight Bearing Restrictions: No       Mobility Bed Mobility                  Transfers                      Balance                                   ADL Overall ADL's : Needs assistance/impaired                                       General ADL Comments: instructed pt on one handed tech using L hand for hand to mouth for brushing teeth and using R hand as gross assist. Pt used to be right handed and was using R before this CVA.  Mod cues to remind her to answer using yes/no responses vs trying to explain.  Minimal active movement noted in RUE and hand and rolled up pillow case replacd with rolled up washcloth  to increase stretch and space at web space in R hand.        Vision                     Perception     Praxis      Cognition   Behavior During Therapy: [facility] for tasks assessed/performed Overall Cognitive Status: Difficult to assess (due to expressive aphasia and not clear of baseline status)                       Extremity/Trunk Assessment               Exercises     Shoulder Instructions       General Comments      Pertinent Vitals/ Pain       Pain Assessment: No/denies pain  Home Living  Prior Functioning/Environment              Frequency Min 1X/week     Progress Toward Goals  OT Goals(current goals can now be found in the care plan section)  Progress towards OT goals: Progressing toward goals  Acute Rehab OT Goals Patient Stated Goal: Pt unable to verbalize full sentance for goal but when asked if she wants to work on regaining use of RUE she said yes  OT Goal Formulation: With patient Time For Goal Achievement: 03/10/16 Potential to Achieve Goals: Good  Plan Discharge plan remains appropriate    Co-evaluation                 End of Session     Activity Tolerance Patient tolerated treatment well   Patient Left in bed;with call bell/phone within reach;with bed alarm set   Nurse Communication          Time: 1320-1350 OT Time Calculation (min): 30 min  Charges: OT General Charges $OT Visit: 1 Procedure OT Treatments $Self Care/Home Management : 8-22 mins $Neuromuscular Re-education: 8-22 mins   Susanne Borders, OTR/L ascom [phone] 03/10/16, 3:10 PM

## 2016-03-10 NOTE — Care Management (Signed)
Admitted to [facility] with the diagnosis of CVA. Lives with daughter, Harriett Sineancy [phone])[name] NP  is listed as physician's care. Did say yes to question concerning rolling walker. Unable to understand information that was discussed with Ms. Smethurst due to expressive dysphagia.  Telephone call to daughter, Harriett Sineancy [phone]) and son Kathlene NovemberMike [phone]) left voice mail. Gwenette GreetBrenda S Andrewjames Weirauch RN MSN CCM Care Management [phone]

## 2016-03-10 NOTE — Care Management Important Message (Signed)
Important Message  Patient Details  Name: Alexandria Hanson MRN: [id number] Date of Birth: May 01, 1929   Medicare Important Message Given:  Yes    Gwenette GreetBrenda S Adalai Perl, RN 03/10/2016, 8:57 AM

## 2016-03-11 DIAGNOSIS — R471 Dysarthria and anarthria: Secondary | ICD-10-CM | POA: Diagnosis not present

## 2016-03-11 DIAGNOSIS — M6281 Muscle weakness (generalized): Secondary | ICD-10-CM | POA: Diagnosis not present

## 2016-03-11 DIAGNOSIS — Z7401 Bed confinement status: Secondary | ICD-10-CM | POA: Diagnosis not present

## 2016-03-11 DIAGNOSIS — Z741 Need for assistance with personal care: Secondary | ICD-10-CM | POA: Diagnosis not present

## 2016-03-11 DIAGNOSIS — H26499 Other secondary cataract, unspecified eye: Secondary | ICD-10-CM | POA: Diagnosis not present

## 2016-03-11 DIAGNOSIS — R531 Weakness: Secondary | ICD-10-CM | POA: Diagnosis not present

## 2016-03-11 DIAGNOSIS — I639 Cerebral infarction, unspecified: Secondary | ICD-10-CM | POA: Diagnosis not present

## 2016-03-11 DIAGNOSIS — I10 Essential (primary) hypertension: Secondary | ICD-10-CM | POA: Diagnosis not present

## 2016-03-11 DIAGNOSIS — R4701 Aphasia: Secondary | ICD-10-CM | POA: Diagnosis not present

## 2016-03-11 DIAGNOSIS — R279 Unspecified lack of coordination: Secondary | ICD-10-CM | POA: Diagnosis not present

## 2016-03-11 DIAGNOSIS — I2510 Atherosclerotic heart disease of native coronary artery without angina pectoris: Secondary | ICD-10-CM | POA: Diagnosis not present

## 2016-03-11 DIAGNOSIS — R262 Difficulty in walking, not elsewhere classified: Secondary | ICD-10-CM | POA: Diagnosis not present

## 2016-03-11 DIAGNOSIS — R1312 Dysphagia, oropharyngeal phase: Secondary | ICD-10-CM | POA: Diagnosis not present

## 2016-03-11 DIAGNOSIS — I679 Cerebrovascular disease, unspecified: Secondary | ICD-10-CM | POA: Diagnosis not present

## 2016-03-11 MED ORDER — CLOPIDOGREL BISULFATE 75 MG PO TABS: 75.0000 mg | ORAL_TABLET | Freq: Every day | ORAL | Status: AC

## 2016-03-11 MED ORDER — ATORVASTATIN CALCIUM 40 MG PO TABS: 40.0000 mg | ORAL_TABLET | Freq: Every day | ORAL | Status: DC

## 2016-03-11 MED ORDER — CEFUROXIME AXETIL 250 MG PO TABS: 250.0000 mg | ORAL_TABLET | Freq: Two times a day (BID) | ORAL | 0 refills | Status: AC

## 2016-03-11 NOTE — NC FL2 (Signed)
  [place] MEDICAID FL2 LEVEL OF CARE SCREENING TOOL     IDENTIFICATION  Patient Name: Alexandria Hanson Birthdate: [birth year] Sex: female Admission Date (Current Location): 03/07/2016  [place]   Facility and Address:  [facility], [address], [place], [place] [address]      Provider Number: [id number]  Attending Physician Name and Address:  Auburn BilberryShreyang Patel, MD  Relative Name and Phone Number:       Current Level of Care: Hospital Recommended Level of Care: Skilled Nursing Facility Prior Approval Number:    Date Approved/Denied:   PASRR Number:   [id number]  Discharge Plan: SNF    Current Diagnoses: Patient Active Problem List   Diagnosis Date Noted  . Stroke (HCC) 03/08/2016    Orientation RESPIRATION BLADDER Height & Weight     Self  Normal Incontinent Weight: 138 lb 12.8 oz (63 kg) (pt weighed with personal pillow) Height:  5\' 3"  (160 cm)  BEHAVIORAL SYMPTOMS/MOOD NEUROLOGICAL BOWEL NUTRITION STATUS   (None)  (None) Incontinent Diet (DYS 3)  AMBULATORY STATUS COMMUNICATION OF NEEDS Skin   Extensive Assist Verbally Normal                       Personal Care Assistance Level of Assistance  Bathing, Feeding, Dressing Bathing Assistance: Limited assistance Feeding assistance: Independent Dressing Assistance: Limited assistance     Functional Limitations Info  Sight, Hearing, Speech Sight Info: Adequate Hearing Info: Adequate Speech Info: Adequate    SPECIAL CARE FACTORS FREQUENCY  PT (By licensed PT), OT (By licensed OT)     PT Frequency:  (5) OT Frequency:  (5)            Contractures      Additional Factors Info  Code Status, Allergies Code Status Info:  (DNR) Allergies Info:  (No Known Allergies)           Current Medications (03/11/2016):  This is the current hospital active medication list Current Facility-Administered Medications  Medication Dose Route Frequency Provider  Last Rate Last Dose  . acetaminophen (TYLENOL) tablet 650 mg  650 mg Oral Q6H PRN Alford Highlandichard Wieting, MD       Or  . acetaminophen (TYLENOL) suppository 650 mg  650 mg Rectal Q6H PRN Alford Highlandichard Wieting, MD      . aspirin EC tablet 81 mg  81 mg Oral Daily Auburn BilberryShreyang Patel, MD   81 mg at 03/11/16 0843  . atorvastatin (LIPITOR) tablet 40 mg  40 mg Oral q1800 Alford Highlandichard Wieting, MD   40 mg at 03/10/16 1822  . cefUROXime (CEFTIN) tablet 250 mg  250 mg Oral BID WC Auburn BilberryShreyang Patel, MD   250 mg at 03/11/16 0843  . clopidogrel (PLAVIX) tablet 75 mg  75 mg Oral Daily Alford Highlandichard Wieting, MD   75 mg at 03/11/16 0843  . enoxaparin (LOVENOX) injection 40 mg  40 mg Subcutaneous Q24H Alford Highlandichard Wieting, MD   40 mg at 03/10/16 2100  . FLUoxetine (PROZAC) capsule 20 mg  20 mg Oral Daily Alford Highlandichard Wieting, MD   20 mg at 03/11/16 0843  . hydrochlorothiazide (HYDRODIURIL) tablet 25 mg  25 mg Oral Daily Auburn BilberryShreyang Patel, MD   25 mg at 03/11/16 [id number]     Discharge Medications: Please see discharge summary for a list of discharge medications.  Relevant Imaging Results:  Relevant Lab Results:   Additional Information  (SSN )[id number]  Verta EllenChristina E Misha Antonini, LCSW

## 2016-03-11 NOTE — Progress Notes (Signed)
Pt transported via EMS. 

## 2016-03-11 NOTE — Progress Notes (Signed)
Physical Therapy Treatment Patient Details Name: Tenna Childlsie C Polack MRN: [id number] DOB: 23-Nov-1928 Today's Date: 03/11/2016    History of Present Illness Donnamarie Poaglsie Stang  is a 82 y.o. female. At 10:30 AM patient had a fall. Later when they checked at her she had no mobility in her right arm and her speech was slurred. She knows what she wants to say but unable to say it. Patient was diagnosed with Acute L CVA.    PT Comments    Pt demonstrates difficulty responding to "yes/no" questions, preferring to attempt to explain self; however becoming visibly frustrated when unable to do so. Pt anxious about falling, initially causing pt to limit activity participation. Pt educated on fall safety/precautions with therapy with understanding from pt and allowing increased activity participation. Pt progressing exercises with less assistance required with the L LE and increased reps bilaterally. Pt requires min to mod vc's for bed mobility to increase efficiency and safe sitting balance. Pt required +1 mod to +2 min assist in sit to stand transfers x4 without LOB and increased fatigue with each trial. HR increased to 131 BPM during this activity; however resolved to 117 BPM with a rest break and 98 BPM at end of session. Pt will benefit from continued PT to assist in strengthening and progressing functional mobility.   Follow Up Recommendations  CIR     Equipment Recommendations       Recommendations for Other Services       Precautions / Restrictions Precautions Precautions: Fall Restrictions Weight Bearing Restrictions: No    Mobility  Bed Mobility Overal bed mobility: Needs Assistance Bed Mobility: Supine to Sit;Sit to Supine     Supine to sit: Min assist Sit to supine: Min assist;Mod assist   General bed mobility comments: mod verbal cues and demonstration for rolling and L UE placement to assist in supine to sit with pt needing reinforcement; pt required assist to get B LEs onto bed and +2  total to reposition up in the bed  Transfers Overall transfer level: Needs assistance Equipment used:  (1 person hand held assist x1; bedrail x3) Transfers: Sit to/from Stand Sit to Stand: Min assist;Mod assist (mod +1 assist; min +2 assist)         General transfer comment: Pt HR increased to 131 BPM after 3 trials, with rest break resolved to 117. Pt able to complete 4th trial with increased fatigue. Pt did not demonstrate any LOB throughout trials and required assistance in last 2 trials for R foot positioning into eversion and lateral blocking of R ankle to prevent injury.  Ambulation/Gait             General Gait Details: Not appropriate at this time   Stairs            Wheelchair Mobility    Modified Rankin (Stroke Patients Only)       Balance Overall balance assessment: Needs assistance Sitting-balance support: Feet supported Sitting balance-Leahy Scale: Fair Sitting balance - Comments: able to sit with supervision; min vc's to decrease trunk sway and sit erect   Standing balance support: During functional activity Standing balance-Leahy Scale: Poor Standing balance comment: requires +1 min guard and L UE support on bedrail to assist balance for safety; pt fatigued with standing approximately 15-30 seconds each trial; continued difficulty weight shifting onto R LE                    Cognition Arousal/Alertness: Awake/alert Behavior During Therapy: Anxious Overall  Cognitive Status: Difficult to assess                      Exercises General Exercises - Lower Extremity Ankle Circles/Pumps: AAROM;Strengthening;10 reps;Supine;Left (R toe flex/ext and R ankle PROM x10; supine) Short Arc Quad: Strengthening;Both;10 reps;AROM;Supine (R SAQ through lesser range with AROM) Heel Slides: AROM;Strengthening;Both;20 reps (mod cuing to complete exercises through full range) Hip ABduction/ADduction: AAROM;Right;20 reps;AROM;Left;10 reps;Supine Other  Exercises Other Exercises: Isometric B hip ADd into pillow; 3 second holds; 10 reps (Increased difficulty completing noted)    General Comments General comments (skin integrity, edema, etc.): Pt agreeable to PT session. Pt reported feeling cold at beginning of session and warm at end of session. Thermostat adjusted accordingly      Pertinent Vitals/Pain Pain Assessment: No/denies pain Pain Score: 0-No pain    Home Living                      Prior Function            PT Goals (current goals can now be found in the care plan section) Acute Rehab PT Goals Patient Stated Goal: Pt unable to verbalize full sentences for goal setting; pt did nod yes when asked if she was afraid of falling; pt educated on PT assistance and pt agreeable to improve transfers.  PT Goal Formulation: With patient Time For Goal Achievement: 03/23/16 Potential to Achieve Goals: Fair Progress towards PT goals: Progressing toward goals    Frequency  7X/week    PT Plan Current plan remains appropriate    Co-evaluation             End of Session Equipment Utilized During Treatment: Gait belt Activity Tolerance: Patient limited by fatigue Patient left: in bed;with call bell/phone within reach;with bed alarm set (towel roll replaced/repostioned in R hand)     Time: 4098-1191 PT Time Calculation (min) (ACUTE ONLY): 47 min  Charges:                       G CodesAlbertina Senegal, SPT 03/11/2016, 10:49 AM

## 2016-03-11 NOTE — Progress Notes (Signed)
Clinical Social Worker was informed that patient will be medically ready to discharge to [facility]. Patient in a agreement with plan. CSW called Raiford Nobleick- [facility] to [facility]'s bed is ready. Provided patient's room number E9 and number to call for report [phone] . [facility].  DNR added to discharge packet.   Call to patient's daughter, voicemail box not set up so CSW could not leave a message to inform her patient would discharge to [facility]. Daughter is aware though from previous discussions with CSW. RN will call report and [facility]  Woodroe Modehristina Trequan Marsolek, MSW, LCSW, LCAS-A Clinical Social Worker [phone]

## 2016-03-11 NOTE — Progress Notes (Signed)
Speech Language Pathology Treatment: Cognitive-Linquistic  Patient Details Name: Alexandria Hanson MRN: [id number] DOB: April 15, 1929 Today's Date: 03/11/2016 Time: 1030-1130 SLP Time Calculation (min) (ACUTE ONLY): 60 min  Assessment / Plan / Recommendation Clinical Impression   Pt seen today during tx session for ongoing assessment of expressive aphasia. Pt continues to present with expressive language deficits which impede her ability to make herself understood using verbal communication. Pt's auditory comprehension continues to appear [facility], but can be impacted by expressive language deficits to the listener. Pt appears to present with adequate receptive language skills and moderate-severe expressive language deficits. Pt was able to complete most automatic associations tasks given minimal phonemic cues. Pt was able to answer most "What" questions asked given moderate phonemic cues and given two choices. Pt was able to name most of the action being completed in an illustration with a 2-3 word utterance given minimal phonemic cues. ST introduced the use of a language communication board during this session, and Pt demonstrated capability to use the pictures to comunicate. Of note, Pt has a fifth grade education and while she is literate, she has difficulty spontaneously spelling words when given a depiction of the alphabet. It was also noted by Pt's family her glasses have been broken for "a few months", and Pt has not been observed reading independently since. This will need to be further assessed when Pt has glasses available. Strategies found to be most effective for Pt were asking Yes/No questions, Phonemic cues and giving pt a choice of two answers. Pt was more aware of expressive speech deficits and would become visually frusterated. Pt's Expressive Aphasia is an interfering factor impeding pt's communication abilitites. Pt would benefit from skilled ST services to improve effective communication  strategies for ADLs. Family members arrived later and strategies were reviewed with them. Recommend continued ST services to address communication needs in ADLs.     HPI HPI: Pt is a 82 y.o. female w/ h/o MI and stroke. At 10:30 AM patient had a fall. Later when they checked at her she had no mobility in her right arm and her speech was slurred. She knows what she wants to say but unable to say it. Last week she did have a headache for 3 days. Code stroke was called. A she was not in the TPA window. She does take aspirin at home. Hospitalists were contacted for admission. Patient has a history of stroke in the past with baseline right-sided weakness. Patient unable to talk in complete sentences clearly. Pt continues to present w/ expressive language deficits; able to indicate wants/needs per NSG and family. Pt and family denied any prior difficulty swallowing before admission. Slight labial decreased tone noted on R side.       SLP Plan  Continue with current plan of care     Recommendations  Diet recommendations: Dysphagia 3 (mechanical soft);Thin liquid Liquids provided via: Cup;Straw Medication Administration: Whole meds with puree Supervision: Patient able to self feed Compensations: Minimize environmental distractions;Slow rate;Small sips/bites;Follow solids with liquid Postural Changes and/or Swallow Maneuvers: Seated upright 90 degrees;Upright 30-60 min after meal             General recommendations: OT consult;PT consult Oral Care Recommendations: Oral care BID;Staff/trained caregiver to provide oral care Follow up Recommendations: Home health SLP;Outpatient SLP;Inpatient Rehab;Skilled Nursing facility Plan: Continue with current plan of care     GO                Altamese DillingLauren Markell Schrier, B.A. Clinical Graduate  Student 03/11/2016, 12:27 PM

## 2016-03-11 NOTE — Progress Notes (Signed)
[facility] given [facility] [place].  EMS contacted for transport.

## 2016-03-11 NOTE — Discharge Summary (Signed)
Alexandria Hanson, 82 y.o., DOB October 02, 1928, MRN [id number]. Admission date: 03/07/2016 Discharge Date 03/11/2016 Primary MD Gabriel Cirri, NP Admitting Physician Alford Highland, MD  Admission Diagnosis  CVA (cerebral infarction) [I63.9] Cerebrovascular accident (CVA), unspecified mechanism [facility]) [I63.9]  Discharge Diagnosis   Active Problems:   Stroke [facility])   Expressive aphasia   Right upper extremity paresis   UTI   Hyperlipidemia   Hypokalemia       Hospital CourseElsie Hanson  is a 82 y.o. female. At 10:30 AM patient had a fall. Later when they checked at her she had no mobility in her right arm and her speech was slurred. She had a MRI of the head which confirmed a stroke. 1. Acute nonhemorrhagic infarct within the posterior left frontal lobe involving the left frontal operculum and precentral gyrus. 2. Additional remote lacunar infarcts are present within the centrum semi of ally bilaterally, left greater than right. 3. Asymmetric left-sided white matter disease. 4. Moderate diffuse medium and small vessel disease as described above. There is asymmetric attenuation of proximal M2 branches on the left compared to the right. 5. Mild distal left M1 segment stenosis. 6. Moderate distal left A1 segment stenosis. 7. Moderate proximal P2 segment stenoses bilaterally. Patient also underwent echocardiogram of the heart which showed no evidence of thrombus. Patient was seen by physical therapy and initially with recommended for inpatient rehabilitation however family wanted her to go to a local skilled nursing facility which is currently being arranged. She continues to have expressive aphasia.  She was noted to have a urinary tract infection due to Escherichia coli she was treated with IV antibiotics now she will need total of 3 days of oral antibiotics.            Consults  neurology  Significant Tests:  See full reports for all details    Mr [name] WU Contrast  Result  Date: 03/08/2016 CLINICAL DATA:  Abnormal speech and right upper extremity weakness after a fall at 10:30 a.m. today. Recent headaches. EXAM: MRI HEAD WITHOUT CONTRAST MRA HEAD WITHOUT CONTRAST TECHNIQUE: Multiplanar, multiecho pulse sequences of the brain and surrounding structures were obtained without intravenous contrast. Angiographic images of the head were obtained using MRA technique without contrast. COMPARISON:  None. FINDINGS: MRI HEAD FINDINGS Acute nonhemorrhagic infarct is present within the posterior left frontal lobe. This involves the left frontal operculum and the precentral gyrus. Maximum dimension of the infarct is 4 cm. T2 changes are present within the area of restricted diffusion. More remote lacunar infarcts are present within the basal ganglia and centrum semi of ally bilaterally, left greater than right. Asymmetric subcortical white matter disease is present in the left hemisphere. Additional remote lacunar infarcts are present in the basal ganglia and cerebellum bilaterally. White matter changes extend into the brainstem. Flow is present in the major intracranial arteries. The skullbase is within normal limits. A relatively empty sella is present. Midline sagittal structures are otherwise unremarkable. The upper cervical spine is within normal limits. The left cerebral peduncle is smaller than the right suggesting remote ischemia and will layering degeneration. The paranasal sinuses and mastoid air cells are clear. The globes and orbits are intact. Hyperostosis frontalis internus is noted. MRA HEAD FINDINGS The internal carotid arteries are within normal limits from the high cervical segments through the ICA termini bilaterally. Minimal atherosclerotic changes present within the cavernous segments. Moderate stenoses are present in the left A1 segment. There is mild irregularity in the right A1 segment. Mild distal  left M1 segment narrowing is present. Moderate segmental narrowing is present  within proximal M2 branches on the left without focal occlusion. More mild segmental disease is present on the right. There is irregular narrowing of the ACA branches bilaterally. The left vertebral artery is the dominant vessel. The left PICA origin is visualized and normal. The right AICA is dominant. The basilar artery is within normal limits. Both posterior cerebral arteries originate the basilar tip. Moderate narrowing is present within proximal PCA branch vessels bilaterally. IMPRESSION: 1. Acute nonhemorrhagic infarct within the posterior left frontal lobe involving the left frontal operculum and precentral gyrus. 2. Additional remote lacunar infarcts are present within the centrum semi of ally bilaterally, left greater than right. 3. Asymmetric left-sided white matter disease. 4. Moderate diffuse medium and small vessel disease as described above. There is asymmetric attenuation of proximal M2 branches on the left compared to the right. 5. Mild distal left M1 segment stenosis. 6. Moderate distal left A1 segment stenosis. 7. Moderate proximal P2 segment stenoses bilaterally. These results were called by telephone at the time of interpretation on 03/08/2016 at 1:31 pm to Dr. Auburn Bilberry , who verbally acknowledged these results. Electronically Signed   By: Marin Roberts M.D.   On: 03/08/2016 13:32   Mr Brain Wo Contrast  Result Date: 03/08/2016 CLINICAL DATA:  Abnormal speech and right upper extremity weakness after a fall at 10:30 a.m. today. Recent headaches. EXAM: MRI HEAD WITHOUT CONTRAST MRA HEAD WITHOUT CONTRAST TECHNIQUE: Multiplanar, multiecho pulse sequences of the brain and surrounding structures were obtained without intravenous contrast. Angiographic images of the head were obtained using MRA technique without contrast. COMPARISON:  None. FINDINGS: MRI HEAD FINDINGS Acute nonhemorrhagic infarct is present within the posterior left frontal lobe. This involves the left frontal operculum  and the precentral gyrus. Maximum dimension of the infarct is 4 cm. T2 changes are present within the area of restricted diffusion. More remote lacunar infarcts are present within the basal ganglia and centrum semi of ally bilaterally, left greater than right. Asymmetric subcortical white matter disease is present in the left hemisphere. Additional remote lacunar infarcts are present in the basal ganglia and cerebellum bilaterally. White matter changes extend into the brainstem. Flow is present in the major intracranial arteries. The skullbase is within normal limits. A relatively empty sella is present. Midline sagittal structures are otherwise unremarkable. The upper cervical spine is within normal limits. The left cerebral peduncle is smaller than the right suggesting remote ischemia and will layering degeneration. The paranasal sinuses and mastoid air cells are clear. The globes and orbits are intact. Hyperostosis frontalis internus is noted. MRA HEAD FINDINGS The internal carotid arteries are within normal limits from the high cervical segments through the ICA termini bilaterally. Minimal atherosclerotic changes present within the cavernous segments. Moderate stenoses are present in the left A1 segment. There is mild irregularity in the right A1 segment. Mild distal left M1 segment narrowing is present. Moderate segmental narrowing is present within proximal M2 branches on the left without focal occlusion. More mild segmental disease is present on the right. There is irregular narrowing of the ACA branches bilaterally. The left vertebral artery is the dominant vessel. The left PICA origin is visualized and normal. The right AICA is dominant. The basilar artery is within normal limits. Both posterior cerebral arteries originate the basilar tip. Moderate narrowing is present within proximal PCA branch vessels bilaterally. IMPRESSION: 1. Acute nonhemorrhagic infarct within the posterior left frontal lobe involving  the left frontal  operculum and precentral gyrus. 2. Additional remote lacunar infarcts are present within the centrum semi of ally bilaterally, left greater than right. 3. Asymmetric left-sided white matter disease. 4. Moderate diffuse medium and small vessel disease as described above. There is asymmetric attenuation of proximal M2 branches on the left compared to the right. 5. Mild distal left M1 segment stenosis. 6. Moderate distal left A1 segment stenosis. 7. Moderate proximal P2 segment stenoses bilaterally. These results were called by telephone at the time of interpretation on 03/08/2016 at 1:31 pm to Dr. Auburn Bilberry , who verbally acknowledged these results. Electronically Signed   By: Marin Roberts M.D.   On: 03/08/2016 13:32   US Carotid Bilateral  Result Date: 03/08/2016 CLINICAL DATA:  82 year old female with a history of cerebral vascular accident. Cardiovascular risk factors include a known prior stroke. EXAM: BILATERAL CAROTID DUPLEX ULTRASOUND TECHNIQUE: Wallace Cullens scale imaging, color Doppler and duplex ultrasound were performed of bilateral carotid and vertebral arteries in the neck. COMPARISON:  MR 03/08/2016 FINDINGS: Criteria: Quantification of carotid stenosis is based on velocity parameters that correlate the residual internal carotid diameter with NASCET-based stenosis levels, using the diameter of the distal internal carotid lumen as the denominator for stenosis measurement. The following velocity measurements were obtained: RIGHT ICA:  Systolic 53 cm/sec, Diastolic 13 cm/sec CCA:  61 cm/sec SYSTOLIC ICA/CCA RATIO:  1.1 ECA:  80 cm/sec LEFT ICA:  Systolic 66 cm/sec, Diastolic 14 cm/sec CCA:  58 cm/sec SYSTOLIC ICA/CCA RATIO:  0.9 ECA:  156 cm/sec Right Brachial SBP: Not acquired Left Brachial SBP: Not acquired RIGHT CAROTID ARTERY: No significant calcifications of the right common carotid artery. Intermediate waveform maintained. Heterogeneous plaque at the right carotid bifurcation. No  significant lumen shadowing. Low resistance waveform of the right ICA. No significant tortuosity. RIGHT VERTEBRAL ARTERY: Antegrade flow with low resistance waveform. LEFT CAROTID ARTERY: No significant calcifications of the left common carotid artery. Intermediate waveform maintained. Heterogeneous and partially calcified plaque at the left carotid bifurcation without significant lumen shadowing. Low resistance waveform of the left ICA. No significant tortuosity. LEFT VERTEBRAL ARTERY:  Antegrade flow with low resistance waveform. IMPRESSION: Color duplex indicates minimal heterogeneous and calcified plaque, with no hemodynamically significant stenosis by duplex criteria in the extracranial cerebrovascular circulation. Signed, Yvone Neu. Loreta Ave, DO [facility] Radiology Electronically Signed   By: Gilmer Mor D.O.   On: 03/08/2016 13:56   Ct Head Code Stroke W/o Cm  Result Date: 03/07/2016 CLINICAL DATA:  Code stroke. Acute onset RIGHT-sided weakness and speech difficulty. History of LEFT subdural hematoma. EXAM: CT HEAD WITHOUT CONTRAST TECHNIQUE: Contiguous axial images were obtained from the base of the skull through the vertex without intravenous contrast. COMPARISON:  CT HEAD December 22, 2010 FINDINGS: BRAIN: No intraparenchymal hemorrhage, mass effect, midline shift or acute large vascular territory infarct. Old LEFT basal ganglia infarct with ex vacuo dilatation LEFT lateral ventricle. Smaller LEFT cerebral peduncle compatible with wallerian degeneration. Old small RIGHT cerebellar infarcts. Stable LEFT posterior fossa arachnoid cyst with mild mass effect. Resolution of LEFT subdural hemorrhage. VASCULAR: Moderate to severe calcific atherosclerosis of the carotid siphons. SKULL: No skull fracture. No significant scalp soft tissue swelling. Hyperostosis frontalis internus. Multiple calcified scalp sebaceous cysts. SINUSES/ORBITS: The included ocular globes and  orbital contents are non-suspicious.The mastoid air-cells and included paranasal sinuses are well-aerated. OTHER: None. ASPECTS [facility] Stroke Program Early CT Score) - Ganglionic level infarction (caudate, lentiform nuclei, internal capsule, insula, M1-M3 cortex): 7 - Supraganglionic infarction (M4-M6  cortex): 3 Total score (0-10 with 10 being normal): 10 IMPRESSION: 1. No acute intracranial process. Old LEFT basal ganglia infarct and moderate chronic small vessel ischemic disease. 2. ASPECTS is 10. Acute findings discussed with and reconfirmed by Dr.PHILLIP STAFFORD on 03/07/2016 at 6:24 pm. Electronically Signed   By: Awilda Metroourtnay  Bloomer M.D.   On: 03/07/2016 18:26       Today   Subjective:   Alexandria Hanson  unable to express herself  Objective:   Blood pressure (!) 153/75, pulse (!) 131, temperature 97.8 F (36.6 C), temperature source Oral, resp. rate 16, height 5\' 3"  (1.6 m), weight 138 lb 12.8 oz (63 kg), SpO2 (!) 86 %.  .  Intake/Output Summary (Last 24 hours) at 03/11/16 1205 Last data filed at 03/11/16 0900  Gross per 24 hour  Intake              960 ml  Output              600 ml  Net              360 ml    Exam VITAL SIGNS: Blood pressure (!) 153/75, pulse (!) 131, temperature 97.8 F (36.6 C), temperature source Oral, resp. rate 16, height 5\' 3"  (1.6 m), weight 138 lb 12.8 oz (63 kg), SpO2 (!) 86 %.  GENERAL:  82 y.o.-year-old patient lying in the bed with no acute distress.  EYES: Pupils equal, round, reactive to light and accommodation. No scleral icterus. Extraocular muscles intact.  HEENT: Head atraumatic, normocephalic. Oropharynx and nasopharynx clear.  NECK:  Supple, no jugular venous distention. No thyroid enlargement, no tenderness.  LUNGS: Normal breath sounds bilaterally, no wheezing, rales,rhonchi or crepitation. No use of accessory muscles of respiration.  CARDIOVASCULAR: S1, S2 normal. No murmurs, rubs, or gallops.  ABDOMEN: Soft, nontender, nondistended. Bowel  sounds present. No organomegaly or mass.  EXTREMITIES: No pedal edema, cyanosis, or clubbing.  NEUROLOGIC: Cranial nerves II through XII are intact.Expressive aphasia and chronic right upper extremity contracture pchy: The patient is alert and oriented x 3.  SKIN: No obvious rash, lesion, or ulcer.   Data Review     CBC w Diff: Lab Results  Component Value Date   WBC 8.1 03/08/2016   HGB 13.3 03/08/2016   HCT 37.7 03/08/2016   PLT 209 03/08/2016   LYMPHOPCT 45 03/07/2016   MONOPCT 6 03/07/2016   EOSPCT 1 03/07/2016   BASOPCT 1 03/07/2016   CMP: Lab Results  Component Value Date   NA 142 03/10/2016   K 4.8 03/10/2016   CL 113 (H) 03/10/2016   CO2 23 03/10/2016   BUN 15 03/10/2016   CREATININE 0.68 03/10/2016   PROT 7.7 03/07/2016   ALBUMIN 4.3 03/07/2016   BILITOT 1.1 03/07/2016   ALKPHOS 73 03/07/2016   AST 33 03/07/2016   ALT 25 03/07/2016  .  Micro Results Recent Results (from the past 240 hour(s))  Urine culture     Status: Abnormal   Collection Time: 03/07/16  8:21 PM  Result Value Ref Range Status   Specimen Description URINE, RANDOM  Final   Special Requests NONE  Final   Culture >=100,000 COLONIES/mL ESCHERICHIA COLI (A)  Final   Report Status 03/10/2016 FINAL  Final   Organism ID, Bacteria ESCHERICHIA COLI (A)  Final      Susceptibility   Escherichia coli - MIC*    AMPICILLIN 4 SENSITIVE Sensitive     CEFAZOLIN <=4 SENSITIVE Sensitive  CEFTRIAXONE <=1 SENSITIVE Sensitive     CIPROFLOXACIN <=0.25 SENSITIVE Sensitive     GENTAMICIN <=1 SENSITIVE Sensitive     IMIPENEM <=0.25 SENSITIVE Sensitive     NITROFURANTOIN <=16 SENSITIVE Sensitive     TRIMETH/SULFA <=20 SENSITIVE Sensitive     AMPICILLIN/SULBACTAM <=2 SENSITIVE Sensitive     PIP/TAZO <=4 SENSITIVE Sensitive     Extended ESBL NEGATIVE Sensitive     * >=100,000 COLONIES/mL ESCHERICHIA COLI        Code Status Orders        Start     Ordered   03/07/16 2005  Do not attempt  resuscitation (DNR)  Continuous    Question Answer Comment  In the event of cardiac or respiratory ARREST Do not call a "code blue"   In the event of cardiac or respiratory ARREST Do not perform Intubation, CPR, defibrillation or ACLS   In the event of cardiac or respiratory ARREST Use medication by any route, position, wound care, and other measures to relive pain and suffering. May use oxygen, suction and manual treatment of airway obstruction as needed for comfort.   Comments nurse may pronounce      03/07/16 2004    Code Status History    Date Active Date Inactive Code Status Order ID Comments User Context   03/07/2016  8:04 PM 03/08/2016 11:21 AM DNR [id number]  Alford Highland, MD ED    Questions for Most Recent Historical Code Status (Order [id number])    Question Answer Comment   In the event of cardiac or respiratory ARREST Do not call a "code blue"    In the event of cardiac or respiratory ARREST Do not perform Intubation, CPR, defibrillation or ACLS    In the event of cardiac or respiratory ARREST Use medication by any route, position, wound care, and other measures to relive pain and suffering. May use oxygen, suction and manual treatment of airway obstruction as needed for comfort.    Comments nurse may pronounce           Follow-up Information    Gabriel Cirri, NP Follow up in 7 day(s).   Specialties:  Nurse Practitioner, Family Medicine Contact information: [address] [place] [address] [phone]           Discharge Medications     Medication List    TAKE these medications   aspirin 81 MG chewable tablet Chew 81 mg by mouth daily.   atorvastatin 40 MG tablet Commonly known as:  LIPITOR Take 1 tablet (40 mg total) by mouth daily at 6 PM.   cefUROXime 250 MG tablet Commonly known as:  CEFTIN Take 1 tablet (250 mg total) by mouth 2 (two) times daily with a meal.   clopidogrel 75 MG tablet Commonly known as:  PLAVIX Take 1 tablet (75 mg total) by mouth  daily.   FLUoxetine 20 MG capsule Commonly known as:  PROZAC Take 1 capsule by mouth daily.   hydrochlorothiazide 25 MG tablet Commonly known as:  HYDRODIURIL Take 1 tablet by mouth daily.   multivitamin tablet Take 1 tablet by mouth daily.          Total Time in preparing paper work, data evaluation and todays exam - 35 minutes  Auburn Bilberry M.D on 03/11/2016 at 12:05 PM  [facility]   Office  [phone]

## 2016-03-11 NOTE — Discharge Instructions (Signed)
°  DIET:  dsyphagia 3 diet with thin liquid  DISCHARGE CONDITION:  Stable  ACTIVITY:  Activity as tolerated  OXYGEN:  Home Oxygen: No.   Oxygen Delivery: room air  DISCHARGE LOCATION:  nursing home    ADDITIONAL DISCHARGE INSTRUCTION: PT/ OT/Speech   If you experience worsening of your admission symptoms, develop shortness of breath, life threatening emergency, suicidal or homicidal thoughts you must seek medical attention immediately by calling 911 or calling your MD immediately  if symptoms less severe.  You Must read complete instructions/literature along with all the possible adverse reactions/side effects for all the Medicines you take and that have been prescribed to you. Take any new Medicines after you have completely understood and accpet all the possible adverse reactions/side effects.   Please note  You were cared for by a hospitalist during your hospital stay. If you have any questions about your discharge medications or the care you received while you were in the hospital after you are discharged, you can call the unit and asked to speak with the hospitalist on call if the hospitalist that took care of you is not available. Once you are discharged, your primary care physician will handle any further medical issues. Please note that NO REFILLS for any discharge medications will be authorized once you are discharged, as it is imperative that you return to your primary care physician (or establish a relationship with a primary care physician if you do not have one) for your aftercare needs so that they can reassess your need for medications and monitor your lab values.

## 2016-03-11 NOTE — Progress Notes (Signed)
CSW spoke to patient's daughter Harriett Sineancy [phone]. Provided patient's SSN. [facility]. CSW completed PASRR number. Placed FL2 in MD's basket for cosign. CSW contacted Raiford Nobleick- Admissions [facility] and informed him that patient accepted bed offer. Awaiting room and report.  Woodroe Modehristina Rheanne Cortopassi, MSW, LCSW, LCAS-A Clinical Social Worker [phone]

## 2016-03-11 NOTE — Clinical Social Work Placement (Signed)
   CLINICAL SOCIAL WORK PLACEMENT  NOTE  Date:  03/11/2016  Patient Details  Name: Alexandria Hanson MRN: [id number] Date of Birth: 1928/08/26  Clinical Social Work is seeking post-discharge placement for this patient at the Skilled  Nursing Facility level of care (*CSW will initial, date and re-position this form in  chart as items are completed):  Yes   Patient/family provided with [facility] Clinical Social Work Department's list of facilities offering this level of care within the geographic area requested by the patient (or if unable, by the patient's family).  Yes   Patient/family informed of their freedom to choose among providers that offer the needed level of care, that participate in Medicare, Medicaid or managed care program needed by the patient, have an available bed and are willing to accept the patient.  Yes   Patient/family informed of [facility]'s ownership interest in [facility] and [facility], as well as of the fact that they are under no obligation to receive care at these facilities.  PASRR submitted to EDS on 03/11/16     PASRR number received on 03/11/16     Existing PASRR number confirmed on       FL2 transmitted to all facilities in geographic area requested by pt/family on       FL2 transmitted to all facilities within larger geographic area on       Patient informed that his/her managed care company has contracts with or will negotiate with certain facilities, including the following:        Yes   Patient/family informed of bed offers received.  Patient chooses bed at  [facility])     Physician recommends and patient chooses bed at      Patient to be transferred to  [facility] on 03/11/16.  Patient to be transferred to facility by  (EMS)     Patient family notified on 03/11/16 of transfer.  Name of family member notified:   (Daugther)     PHYSICIAN       Additional Comment:     _______________________________________________ Idamae Lusherhristina E Javier Gell, LCSW 03/11/2016, 2:30 PM

## 2016-03-15 DIAGNOSIS — I639 Cerebral infarction, unspecified: Secondary | ICD-10-CM | POA: Diagnosis not present

## 2016-03-20 ENCOUNTER — Other Ambulatory Visit: Payer: Self-pay | Admitting: Unknown Physician Specialty

## 2016-03-20 NOTE — Telephone Encounter (Signed)
Your patient 

## 2016-04-23 DIAGNOSIS — H26499 Other secondary cataract, unspecified eye: Secondary | ICD-10-CM | POA: Diagnosis not present

## 2016-04-28 DIAGNOSIS — R4701 Aphasia: Secondary | ICD-10-CM | POA: Diagnosis not present

## 2016-04-28 DIAGNOSIS — R279 Unspecified lack of coordination: Secondary | ICD-10-CM | POA: Diagnosis not present

## 2016-04-28 DIAGNOSIS — R471 Dysarthria and anarthria: Secondary | ICD-10-CM | POA: Diagnosis not present

## 2016-04-28 DIAGNOSIS — M6281 Muscle weakness (generalized): Secondary | ICD-10-CM | POA: Diagnosis not present

## 2016-04-28 DIAGNOSIS — R262 Difficulty in walking, not elsewhere classified: Secondary | ICD-10-CM | POA: Diagnosis not present

## 2016-04-28 DIAGNOSIS — R1312 Dysphagia, oropharyngeal phase: Secondary | ICD-10-CM | POA: Diagnosis not present

## 2016-04-28 DIAGNOSIS — I639 Cerebral infarction, unspecified: Secondary | ICD-10-CM | POA: Diagnosis not present

## 2016-04-28 DIAGNOSIS — Z741 Need for assistance with personal care: Secondary | ICD-10-CM | POA: Diagnosis not present

## 2016-04-29 DIAGNOSIS — R471 Dysarthria and anarthria: Secondary | ICD-10-CM | POA: Diagnosis not present

## 2016-04-29 DIAGNOSIS — M6281 Muscle weakness (generalized): Secondary | ICD-10-CM | POA: Diagnosis not present

## 2016-04-29 DIAGNOSIS — R4701 Aphasia: Secondary | ICD-10-CM | POA: Diagnosis not present

## 2016-04-29 DIAGNOSIS — R1312 Dysphagia, oropharyngeal phase: Secondary | ICD-10-CM | POA: Diagnosis not present

## 2016-04-29 DIAGNOSIS — R262 Difficulty in walking, not elsewhere classified: Secondary | ICD-10-CM | POA: Diagnosis not present

## 2016-04-29 DIAGNOSIS — I639 Cerebral infarction, unspecified: Secondary | ICD-10-CM | POA: Diagnosis not present

## 2016-04-29 DIAGNOSIS — R279 Unspecified lack of coordination: Secondary | ICD-10-CM | POA: Diagnosis not present

## 2016-04-29 DIAGNOSIS — Z741 Need for assistance with personal care: Secondary | ICD-10-CM | POA: Diagnosis not present

## 2016-04-30 DIAGNOSIS — R262 Difficulty in walking, not elsewhere classified: Secondary | ICD-10-CM | POA: Diagnosis not present

## 2016-04-30 DIAGNOSIS — Z741 Need for assistance with personal care: Secondary | ICD-10-CM | POA: Diagnosis not present

## 2016-04-30 DIAGNOSIS — I639 Cerebral infarction, unspecified: Secondary | ICD-10-CM | POA: Diagnosis not present

## 2016-04-30 DIAGNOSIS — R279 Unspecified lack of coordination: Secondary | ICD-10-CM | POA: Diagnosis not present

## 2016-04-30 DIAGNOSIS — R1312 Dysphagia, oropharyngeal phase: Secondary | ICD-10-CM | POA: Diagnosis not present

## 2016-04-30 DIAGNOSIS — M6281 Muscle weakness (generalized): Secondary | ICD-10-CM | POA: Diagnosis not present

## 2016-04-30 DIAGNOSIS — R4701 Aphasia: Secondary | ICD-10-CM | POA: Diagnosis not present

## 2016-04-30 DIAGNOSIS — R471 Dysarthria and anarthria: Secondary | ICD-10-CM | POA: Diagnosis not present

## 2016-05-01 DIAGNOSIS — I639 Cerebral infarction, unspecified: Secondary | ICD-10-CM | POA: Diagnosis not present

## 2016-05-01 DIAGNOSIS — R279 Unspecified lack of coordination: Secondary | ICD-10-CM | POA: Diagnosis not present

## 2016-05-01 DIAGNOSIS — M6281 Muscle weakness (generalized): Secondary | ICD-10-CM | POA: Diagnosis not present

## 2016-05-01 DIAGNOSIS — Z741 Need for assistance with personal care: Secondary | ICD-10-CM | POA: Diagnosis not present

## 2016-05-01 DIAGNOSIS — R262 Difficulty in walking, not elsewhere classified: Secondary | ICD-10-CM | POA: Diagnosis not present

## 2016-05-01 DIAGNOSIS — R1312 Dysphagia, oropharyngeal phase: Secondary | ICD-10-CM | POA: Diagnosis not present

## 2016-05-01 DIAGNOSIS — R4701 Aphasia: Secondary | ICD-10-CM | POA: Diagnosis not present

## 2016-05-01 DIAGNOSIS — R471 Dysarthria and anarthria: Secondary | ICD-10-CM | POA: Diagnosis not present

## 2016-05-02 DIAGNOSIS — R471 Dysarthria and anarthria: Secondary | ICD-10-CM | POA: Diagnosis not present

## 2016-05-02 DIAGNOSIS — R1312 Dysphagia, oropharyngeal phase: Secondary | ICD-10-CM | POA: Diagnosis not present

## 2016-05-02 DIAGNOSIS — I639 Cerebral infarction, unspecified: Secondary | ICD-10-CM | POA: Diagnosis not present

## 2016-05-02 DIAGNOSIS — R279 Unspecified lack of coordination: Secondary | ICD-10-CM | POA: Diagnosis not present

## 2016-05-02 DIAGNOSIS — R4701 Aphasia: Secondary | ICD-10-CM | POA: Diagnosis not present

## 2016-05-02 DIAGNOSIS — R262 Difficulty in walking, not elsewhere classified: Secondary | ICD-10-CM | POA: Diagnosis not present

## 2016-05-02 DIAGNOSIS — M6281 Muscle weakness (generalized): Secondary | ICD-10-CM | POA: Diagnosis not present

## 2016-05-02 DIAGNOSIS — Z741 Need for assistance with personal care: Secondary | ICD-10-CM | POA: Diagnosis not present

## 2016-05-05 DIAGNOSIS — R1312 Dysphagia, oropharyngeal phase: Secondary | ICD-10-CM | POA: Diagnosis not present

## 2016-05-05 DIAGNOSIS — I639 Cerebral infarction, unspecified: Secondary | ICD-10-CM | POA: Diagnosis not present

## 2016-05-05 DIAGNOSIS — R279 Unspecified lack of coordination: Secondary | ICD-10-CM | POA: Diagnosis not present

## 2016-05-05 DIAGNOSIS — Z741 Need for assistance with personal care: Secondary | ICD-10-CM | POA: Diagnosis not present

## 2016-05-05 DIAGNOSIS — R471 Dysarthria and anarthria: Secondary | ICD-10-CM | POA: Diagnosis not present

## 2016-05-05 DIAGNOSIS — M6281 Muscle weakness (generalized): Secondary | ICD-10-CM | POA: Diagnosis not present

## 2016-05-05 DIAGNOSIS — R4701 Aphasia: Secondary | ICD-10-CM | POA: Diagnosis not present

## 2016-05-05 DIAGNOSIS — R262 Difficulty in walking, not elsewhere classified: Secondary | ICD-10-CM | POA: Diagnosis not present

## 2016-05-06 DIAGNOSIS — I639 Cerebral infarction, unspecified: Secondary | ICD-10-CM | POA: Diagnosis not present

## 2016-05-06 DIAGNOSIS — R279 Unspecified lack of coordination: Secondary | ICD-10-CM | POA: Diagnosis not present

## 2016-05-06 DIAGNOSIS — Z741 Need for assistance with personal care: Secondary | ICD-10-CM | POA: Diagnosis not present

## 2016-05-06 DIAGNOSIS — M6281 Muscle weakness (generalized): Secondary | ICD-10-CM | POA: Diagnosis not present

## 2016-05-06 DIAGNOSIS — R1312 Dysphagia, oropharyngeal phase: Secondary | ICD-10-CM | POA: Diagnosis not present

## 2016-05-06 DIAGNOSIS — R471 Dysarthria and anarthria: Secondary | ICD-10-CM | POA: Diagnosis not present

## 2016-05-06 DIAGNOSIS — R262 Difficulty in walking, not elsewhere classified: Secondary | ICD-10-CM | POA: Diagnosis not present

## 2016-05-06 DIAGNOSIS — R4701 Aphasia: Secondary | ICD-10-CM | POA: Diagnosis not present

## 2016-05-07 DIAGNOSIS — R262 Difficulty in walking, not elsewhere classified: Secondary | ICD-10-CM | POA: Diagnosis not present

## 2016-05-07 DIAGNOSIS — M6281 Muscle weakness (generalized): Secondary | ICD-10-CM | POA: Diagnosis not present

## 2016-05-07 DIAGNOSIS — R471 Dysarthria and anarthria: Secondary | ICD-10-CM | POA: Diagnosis not present

## 2016-05-07 DIAGNOSIS — R279 Unspecified lack of coordination: Secondary | ICD-10-CM | POA: Diagnosis not present

## 2016-05-07 DIAGNOSIS — R4701 Aphasia: Secondary | ICD-10-CM | POA: Diagnosis not present

## 2016-05-07 DIAGNOSIS — Z741 Need for assistance with personal care: Secondary | ICD-10-CM | POA: Diagnosis not present

## 2016-05-07 DIAGNOSIS — I639 Cerebral infarction, unspecified: Secondary | ICD-10-CM | POA: Diagnosis not present

## 2016-05-07 DIAGNOSIS — R1312 Dysphagia, oropharyngeal phase: Secondary | ICD-10-CM | POA: Diagnosis not present

## 2016-05-08 DIAGNOSIS — R4701 Aphasia: Secondary | ICD-10-CM | POA: Diagnosis not present

## 2016-05-08 DIAGNOSIS — R262 Difficulty in walking, not elsewhere classified: Secondary | ICD-10-CM | POA: Diagnosis not present

## 2016-05-08 DIAGNOSIS — M6281 Muscle weakness (generalized): Secondary | ICD-10-CM | POA: Diagnosis not present

## 2016-05-08 DIAGNOSIS — Z741 Need for assistance with personal care: Secondary | ICD-10-CM | POA: Diagnosis not present

## 2016-05-08 DIAGNOSIS — R1312 Dysphagia, oropharyngeal phase: Secondary | ICD-10-CM | POA: Diagnosis not present

## 2016-05-08 DIAGNOSIS — R279 Unspecified lack of coordination: Secondary | ICD-10-CM | POA: Diagnosis not present

## 2016-05-08 DIAGNOSIS — R471 Dysarthria and anarthria: Secondary | ICD-10-CM | POA: Diagnosis not present

## 2016-05-08 DIAGNOSIS — I639 Cerebral infarction, unspecified: Secondary | ICD-10-CM | POA: Diagnosis not present

## 2016-05-09 DIAGNOSIS — Z741 Need for assistance with personal care: Secondary | ICD-10-CM | POA: Diagnosis not present

## 2016-05-09 DIAGNOSIS — R279 Unspecified lack of coordination: Secondary | ICD-10-CM | POA: Diagnosis not present

## 2016-05-09 DIAGNOSIS — I639 Cerebral infarction, unspecified: Secondary | ICD-10-CM | POA: Diagnosis not present

## 2016-05-09 DIAGNOSIS — R1312 Dysphagia, oropharyngeal phase: Secondary | ICD-10-CM | POA: Diagnosis not present

## 2016-05-09 DIAGNOSIS — R4701 Aphasia: Secondary | ICD-10-CM | POA: Diagnosis not present

## 2016-05-09 DIAGNOSIS — R471 Dysarthria and anarthria: Secondary | ICD-10-CM | POA: Diagnosis not present

## 2016-05-09 DIAGNOSIS — M6281 Muscle weakness (generalized): Secondary | ICD-10-CM | POA: Diagnosis not present

## 2016-05-09 DIAGNOSIS — R262 Difficulty in walking, not elsewhere classified: Secondary | ICD-10-CM | POA: Diagnosis not present

## 2016-05-12 DIAGNOSIS — R279 Unspecified lack of coordination: Secondary | ICD-10-CM | POA: Diagnosis not present

## 2016-05-12 DIAGNOSIS — R1312 Dysphagia, oropharyngeal phase: Secondary | ICD-10-CM | POA: Diagnosis not present

## 2016-05-12 DIAGNOSIS — R262 Difficulty in walking, not elsewhere classified: Secondary | ICD-10-CM | POA: Diagnosis not present

## 2016-05-12 DIAGNOSIS — M6281 Muscle weakness (generalized): Secondary | ICD-10-CM | POA: Diagnosis not present

## 2016-05-12 DIAGNOSIS — R471 Dysarthria and anarthria: Secondary | ICD-10-CM | POA: Diagnosis not present

## 2016-05-12 DIAGNOSIS — I639 Cerebral infarction, unspecified: Secondary | ICD-10-CM | POA: Diagnosis not present

## 2016-05-12 DIAGNOSIS — Z741 Need for assistance with personal care: Secondary | ICD-10-CM | POA: Diagnosis not present

## 2016-05-12 DIAGNOSIS — R4701 Aphasia: Secondary | ICD-10-CM | POA: Diagnosis not present

## 2016-05-13 DIAGNOSIS — Z741 Need for assistance with personal care: Secondary | ICD-10-CM | POA: Diagnosis not present

## 2016-05-13 DIAGNOSIS — I639 Cerebral infarction, unspecified: Secondary | ICD-10-CM | POA: Diagnosis not present

## 2016-05-13 DIAGNOSIS — R471 Dysarthria and anarthria: Secondary | ICD-10-CM | POA: Diagnosis not present

## 2016-05-13 DIAGNOSIS — R262 Difficulty in walking, not elsewhere classified: Secondary | ICD-10-CM | POA: Diagnosis not present

## 2016-05-13 DIAGNOSIS — R1312 Dysphagia, oropharyngeal phase: Secondary | ICD-10-CM | POA: Diagnosis not present

## 2016-05-13 DIAGNOSIS — R4701 Aphasia: Secondary | ICD-10-CM | POA: Diagnosis not present

## 2016-05-13 DIAGNOSIS — R279 Unspecified lack of coordination: Secondary | ICD-10-CM | POA: Diagnosis not present

## 2016-05-13 DIAGNOSIS — M6281 Muscle weakness (generalized): Secondary | ICD-10-CM | POA: Diagnosis not present

## 2016-05-14 DIAGNOSIS — R4701 Aphasia: Secondary | ICD-10-CM | POA: Diagnosis not present

## 2016-05-14 DIAGNOSIS — R279 Unspecified lack of coordination: Secondary | ICD-10-CM | POA: Diagnosis not present

## 2016-05-14 DIAGNOSIS — M6281 Muscle weakness (generalized): Secondary | ICD-10-CM | POA: Diagnosis not present

## 2016-05-14 DIAGNOSIS — Z741 Need for assistance with personal care: Secondary | ICD-10-CM | POA: Diagnosis not present

## 2016-05-14 DIAGNOSIS — R471 Dysarthria and anarthria: Secondary | ICD-10-CM | POA: Diagnosis not present

## 2016-05-14 DIAGNOSIS — R1312 Dysphagia, oropharyngeal phase: Secondary | ICD-10-CM | POA: Diagnosis not present

## 2016-05-14 DIAGNOSIS — R262 Difficulty in walking, not elsewhere classified: Secondary | ICD-10-CM | POA: Diagnosis not present

## 2016-05-14 DIAGNOSIS — I639 Cerebral infarction, unspecified: Secondary | ICD-10-CM | POA: Diagnosis not present

## 2016-05-15 DIAGNOSIS — M6281 Muscle weakness (generalized): Secondary | ICD-10-CM | POA: Diagnosis not present

## 2016-05-15 DIAGNOSIS — R279 Unspecified lack of coordination: Secondary | ICD-10-CM | POA: Diagnosis not present

## 2016-05-15 DIAGNOSIS — I639 Cerebral infarction, unspecified: Secondary | ICD-10-CM | POA: Diagnosis not present

## 2016-05-15 DIAGNOSIS — R4701 Aphasia: Secondary | ICD-10-CM | POA: Diagnosis not present

## 2016-05-15 DIAGNOSIS — R1312 Dysphagia, oropharyngeal phase: Secondary | ICD-10-CM | POA: Diagnosis not present

## 2016-05-15 DIAGNOSIS — Z741 Need for assistance with personal care: Secondary | ICD-10-CM | POA: Diagnosis not present

## 2016-05-15 DIAGNOSIS — R471 Dysarthria and anarthria: Secondary | ICD-10-CM | POA: Diagnosis not present

## 2016-05-15 DIAGNOSIS — R262 Difficulty in walking, not elsewhere classified: Secondary | ICD-10-CM | POA: Diagnosis not present

## 2016-05-16 DIAGNOSIS — R279 Unspecified lack of coordination: Secondary | ICD-10-CM | POA: Diagnosis not present

## 2016-05-16 DIAGNOSIS — R262 Difficulty in walking, not elsewhere classified: Secondary | ICD-10-CM | POA: Diagnosis not present

## 2016-05-16 DIAGNOSIS — R4701 Aphasia: Secondary | ICD-10-CM | POA: Diagnosis not present

## 2016-05-16 DIAGNOSIS — M6281 Muscle weakness (generalized): Secondary | ICD-10-CM | POA: Diagnosis not present

## 2016-05-16 DIAGNOSIS — R1312 Dysphagia, oropharyngeal phase: Secondary | ICD-10-CM | POA: Diagnosis not present

## 2016-05-16 DIAGNOSIS — Z741 Need for assistance with personal care: Secondary | ICD-10-CM | POA: Diagnosis not present

## 2016-05-16 DIAGNOSIS — R471 Dysarthria and anarthria: Secondary | ICD-10-CM | POA: Diagnosis not present

## 2016-05-16 DIAGNOSIS — I639 Cerebral infarction, unspecified: Secondary | ICD-10-CM | POA: Diagnosis not present

## 2016-05-19 DIAGNOSIS — R4701 Aphasia: Secondary | ICD-10-CM | POA: Diagnosis not present

## 2016-05-19 DIAGNOSIS — M6281 Muscle weakness (generalized): Secondary | ICD-10-CM | POA: Diagnosis not present

## 2016-05-19 DIAGNOSIS — R262 Difficulty in walking, not elsewhere classified: Secondary | ICD-10-CM | POA: Diagnosis not present

## 2016-05-19 DIAGNOSIS — R1312 Dysphagia, oropharyngeal phase: Secondary | ICD-10-CM | POA: Diagnosis not present

## 2016-05-19 DIAGNOSIS — R471 Dysarthria and anarthria: Secondary | ICD-10-CM | POA: Diagnosis not present

## 2016-05-19 DIAGNOSIS — R279 Unspecified lack of coordination: Secondary | ICD-10-CM | POA: Diagnosis not present

## 2016-05-19 DIAGNOSIS — Z741 Need for assistance with personal care: Secondary | ICD-10-CM | POA: Diagnosis not present

## 2016-05-19 DIAGNOSIS — I639 Cerebral infarction, unspecified: Secondary | ICD-10-CM | POA: Diagnosis not present

## 2016-05-20 DIAGNOSIS — R4701 Aphasia: Secondary | ICD-10-CM | POA: Diagnosis not present

## 2016-05-20 DIAGNOSIS — Z741 Need for assistance with personal care: Secondary | ICD-10-CM | POA: Diagnosis not present

## 2016-05-20 DIAGNOSIS — I639 Cerebral infarction, unspecified: Secondary | ICD-10-CM | POA: Diagnosis not present

## 2016-05-20 DIAGNOSIS — M6281 Muscle weakness (generalized): Secondary | ICD-10-CM | POA: Diagnosis not present

## 2016-05-20 DIAGNOSIS — R279 Unspecified lack of coordination: Secondary | ICD-10-CM | POA: Diagnosis not present

## 2016-05-20 DIAGNOSIS — R262 Difficulty in walking, not elsewhere classified: Secondary | ICD-10-CM | POA: Diagnosis not present

## 2016-05-20 DIAGNOSIS — R471 Dysarthria and anarthria: Secondary | ICD-10-CM | POA: Diagnosis not present

## 2016-05-20 DIAGNOSIS — R1312 Dysphagia, oropharyngeal phase: Secondary | ICD-10-CM | POA: Diagnosis not present

## 2016-05-21 DIAGNOSIS — I639 Cerebral infarction, unspecified: Secondary | ICD-10-CM | POA: Diagnosis not present

## 2016-05-21 DIAGNOSIS — M6281 Muscle weakness (generalized): Secondary | ICD-10-CM | POA: Diagnosis not present

## 2016-05-21 DIAGNOSIS — R279 Unspecified lack of coordination: Secondary | ICD-10-CM | POA: Diagnosis not present

## 2016-05-21 DIAGNOSIS — R4701 Aphasia: Secondary | ICD-10-CM | POA: Diagnosis not present

## 2016-05-21 DIAGNOSIS — R262 Difficulty in walking, not elsewhere classified: Secondary | ICD-10-CM | POA: Diagnosis not present

## 2016-05-21 DIAGNOSIS — R471 Dysarthria and anarthria: Secondary | ICD-10-CM | POA: Diagnosis not present

## 2016-05-21 DIAGNOSIS — R1312 Dysphagia, oropharyngeal phase: Secondary | ICD-10-CM | POA: Diagnosis not present

## 2016-05-21 DIAGNOSIS — Z741 Need for assistance with personal care: Secondary | ICD-10-CM | POA: Diagnosis not present

## 2016-05-22 DIAGNOSIS — R262 Difficulty in walking, not elsewhere classified: Secondary | ICD-10-CM | POA: Diagnosis not present

## 2016-05-22 DIAGNOSIS — R471 Dysarthria and anarthria: Secondary | ICD-10-CM | POA: Diagnosis not present

## 2016-05-22 DIAGNOSIS — Z741 Need for assistance with personal care: Secondary | ICD-10-CM | POA: Diagnosis not present

## 2016-05-22 DIAGNOSIS — R4701 Aphasia: Secondary | ICD-10-CM | POA: Diagnosis not present

## 2016-05-22 DIAGNOSIS — R1312 Dysphagia, oropharyngeal phase: Secondary | ICD-10-CM | POA: Diagnosis not present

## 2016-05-22 DIAGNOSIS — R279 Unspecified lack of coordination: Secondary | ICD-10-CM | POA: Diagnosis not present

## 2016-05-22 DIAGNOSIS — I639 Cerebral infarction, unspecified: Secondary | ICD-10-CM | POA: Diagnosis not present

## 2016-05-22 DIAGNOSIS — M6281 Muscle weakness (generalized): Secondary | ICD-10-CM | POA: Diagnosis not present

## 2016-05-23 DIAGNOSIS — M6281 Muscle weakness (generalized): Secondary | ICD-10-CM | POA: Diagnosis not present

## 2016-05-23 DIAGNOSIS — R471 Dysarthria and anarthria: Secondary | ICD-10-CM | POA: Diagnosis not present

## 2016-05-23 DIAGNOSIS — R4701 Aphasia: Secondary | ICD-10-CM | POA: Diagnosis not present

## 2016-05-23 DIAGNOSIS — R279 Unspecified lack of coordination: Secondary | ICD-10-CM | POA: Diagnosis not present

## 2016-05-23 DIAGNOSIS — R262 Difficulty in walking, not elsewhere classified: Secondary | ICD-10-CM | POA: Diagnosis not present

## 2016-05-23 DIAGNOSIS — Z741 Need for assistance with personal care: Secondary | ICD-10-CM | POA: Diagnosis not present

## 2016-05-23 DIAGNOSIS — I639 Cerebral infarction, unspecified: Secondary | ICD-10-CM | POA: Diagnosis not present

## 2016-05-23 DIAGNOSIS — R1312 Dysphagia, oropharyngeal phase: Secondary | ICD-10-CM | POA: Diagnosis not present

## 2016-05-26 DIAGNOSIS — R471 Dysarthria and anarthria: Secondary | ICD-10-CM | POA: Diagnosis not present

## 2016-05-26 DIAGNOSIS — R279 Unspecified lack of coordination: Secondary | ICD-10-CM | POA: Diagnosis not present

## 2016-05-26 DIAGNOSIS — R262 Difficulty in walking, not elsewhere classified: Secondary | ICD-10-CM | POA: Diagnosis not present

## 2016-05-26 DIAGNOSIS — R1312 Dysphagia, oropharyngeal phase: Secondary | ICD-10-CM | POA: Diagnosis not present

## 2016-05-26 DIAGNOSIS — R4701 Aphasia: Secondary | ICD-10-CM | POA: Diagnosis not present

## 2016-05-26 DIAGNOSIS — I639 Cerebral infarction, unspecified: Secondary | ICD-10-CM | POA: Diagnosis not present

## 2016-05-26 DIAGNOSIS — M6281 Muscle weakness (generalized): Secondary | ICD-10-CM | POA: Diagnosis not present

## 2016-05-26 DIAGNOSIS — Z741 Need for assistance with personal care: Secondary | ICD-10-CM | POA: Diagnosis not present

## 2016-05-27 DIAGNOSIS — R1312 Dysphagia, oropharyngeal phase: Secondary | ICD-10-CM | POA: Diagnosis not present

## 2016-05-27 DIAGNOSIS — R262 Difficulty in walking, not elsewhere classified: Secondary | ICD-10-CM | POA: Diagnosis not present

## 2016-05-27 DIAGNOSIS — M6281 Muscle weakness (generalized): Secondary | ICD-10-CM | POA: Diagnosis not present

## 2016-05-27 DIAGNOSIS — I639 Cerebral infarction, unspecified: Secondary | ICD-10-CM | POA: Diagnosis not present

## 2016-05-27 DIAGNOSIS — Z741 Need for assistance with personal care: Secondary | ICD-10-CM | POA: Diagnosis not present

## 2016-05-27 DIAGNOSIS — R471 Dysarthria and anarthria: Secondary | ICD-10-CM | POA: Diagnosis not present

## 2016-05-27 DIAGNOSIS — R279 Unspecified lack of coordination: Secondary | ICD-10-CM | POA: Diagnosis not present

## 2016-05-27 DIAGNOSIS — R4701 Aphasia: Secondary | ICD-10-CM | POA: Diagnosis not present

## 2016-05-28 DIAGNOSIS — R4701 Aphasia: Secondary | ICD-10-CM | POA: Diagnosis not present

## 2016-05-28 DIAGNOSIS — R471 Dysarthria and anarthria: Secondary | ICD-10-CM | POA: Diagnosis not present

## 2016-05-28 DIAGNOSIS — R1312 Dysphagia, oropharyngeal phase: Secondary | ICD-10-CM | POA: Diagnosis not present

## 2016-05-28 DIAGNOSIS — I639 Cerebral infarction, unspecified: Secondary | ICD-10-CM | POA: Diagnosis not present

## 2016-05-28 DIAGNOSIS — Z741 Need for assistance with personal care: Secondary | ICD-10-CM | POA: Diagnosis not present

## 2016-05-28 DIAGNOSIS — R279 Unspecified lack of coordination: Secondary | ICD-10-CM | POA: Diagnosis not present

## 2016-05-28 DIAGNOSIS — R262 Difficulty in walking, not elsewhere classified: Secondary | ICD-10-CM | POA: Diagnosis not present

## 2016-05-28 DIAGNOSIS — M6281 Muscle weakness (generalized): Secondary | ICD-10-CM | POA: Diagnosis not present

## 2016-05-29 DIAGNOSIS — R279 Unspecified lack of coordination: Secondary | ICD-10-CM | POA: Diagnosis not present

## 2016-05-29 DIAGNOSIS — R4701 Aphasia: Secondary | ICD-10-CM | POA: Diagnosis not present

## 2016-05-29 DIAGNOSIS — R471 Dysarthria and anarthria: Secondary | ICD-10-CM | POA: Diagnosis not present

## 2016-05-29 DIAGNOSIS — R262 Difficulty in walking, not elsewhere classified: Secondary | ICD-10-CM | POA: Diagnosis not present

## 2016-05-29 DIAGNOSIS — Z741 Need for assistance with personal care: Secondary | ICD-10-CM | POA: Diagnosis not present

## 2016-05-29 DIAGNOSIS — M6281 Muscle weakness (generalized): Secondary | ICD-10-CM | POA: Diagnosis not present

## 2016-05-29 DIAGNOSIS — R1312 Dysphagia, oropharyngeal phase: Secondary | ICD-10-CM | POA: Diagnosis not present

## 2016-05-29 DIAGNOSIS — I639 Cerebral infarction, unspecified: Secondary | ICD-10-CM | POA: Diagnosis not present

## 2016-05-30 DIAGNOSIS — M6281 Muscle weakness (generalized): Secondary | ICD-10-CM | POA: Diagnosis not present

## 2016-05-30 DIAGNOSIS — R1312 Dysphagia, oropharyngeal phase: Secondary | ICD-10-CM | POA: Diagnosis not present

## 2016-05-30 DIAGNOSIS — R471 Dysarthria and anarthria: Secondary | ICD-10-CM | POA: Diagnosis not present

## 2016-05-30 DIAGNOSIS — I639 Cerebral infarction, unspecified: Secondary | ICD-10-CM | POA: Diagnosis not present

## 2016-05-30 DIAGNOSIS — R4701 Aphasia: Secondary | ICD-10-CM | POA: Diagnosis not present

## 2016-05-30 DIAGNOSIS — R279 Unspecified lack of coordination: Secondary | ICD-10-CM | POA: Diagnosis not present

## 2016-05-30 DIAGNOSIS — R262 Difficulty in walking, not elsewhere classified: Secondary | ICD-10-CM | POA: Diagnosis not present

## 2016-06-02 DIAGNOSIS — R279 Unspecified lack of coordination: Secondary | ICD-10-CM | POA: Diagnosis not present

## 2016-06-02 DIAGNOSIS — R1312 Dysphagia, oropharyngeal phase: Secondary | ICD-10-CM | POA: Diagnosis not present

## 2016-06-02 DIAGNOSIS — M6281 Muscle weakness (generalized): Secondary | ICD-10-CM | POA: Diagnosis not present

## 2016-06-02 DIAGNOSIS — R262 Difficulty in walking, not elsewhere classified: Secondary | ICD-10-CM | POA: Diagnosis not present

## 2016-06-02 DIAGNOSIS — R4701 Aphasia: Secondary | ICD-10-CM | POA: Diagnosis not present

## 2016-06-02 DIAGNOSIS — R471 Dysarthria and anarthria: Secondary | ICD-10-CM | POA: Diagnosis not present

## 2016-06-02 DIAGNOSIS — I639 Cerebral infarction, unspecified: Secondary | ICD-10-CM | POA: Diagnosis not present

## 2016-06-03 DIAGNOSIS — I639 Cerebral infarction, unspecified: Secondary | ICD-10-CM | POA: Diagnosis not present

## 2016-06-03 DIAGNOSIS — R471 Dysarthria and anarthria: Secondary | ICD-10-CM | POA: Diagnosis not present

## 2016-06-03 DIAGNOSIS — R4701 Aphasia: Secondary | ICD-10-CM | POA: Diagnosis not present

## 2016-06-03 DIAGNOSIS — R279 Unspecified lack of coordination: Secondary | ICD-10-CM | POA: Diagnosis not present

## 2016-06-03 DIAGNOSIS — R1312 Dysphagia, oropharyngeal phase: Secondary | ICD-10-CM | POA: Diagnosis not present

## 2016-06-03 DIAGNOSIS — R262 Difficulty in walking, not elsewhere classified: Secondary | ICD-10-CM | POA: Diagnosis not present

## 2016-06-03 DIAGNOSIS — M6281 Muscle weakness (generalized): Secondary | ICD-10-CM | POA: Diagnosis not present

## 2016-06-04 DIAGNOSIS — R279 Unspecified lack of coordination: Secondary | ICD-10-CM | POA: Diagnosis not present

## 2016-06-04 DIAGNOSIS — I639 Cerebral infarction, unspecified: Secondary | ICD-10-CM | POA: Diagnosis not present

## 2016-06-04 DIAGNOSIS — M6281 Muscle weakness (generalized): Secondary | ICD-10-CM | POA: Diagnosis not present

## 2016-06-04 DIAGNOSIS — R262 Difficulty in walking, not elsewhere classified: Secondary | ICD-10-CM | POA: Diagnosis not present

## 2016-06-04 DIAGNOSIS — R1312 Dysphagia, oropharyngeal phase: Secondary | ICD-10-CM | POA: Diagnosis not present

## 2016-06-04 DIAGNOSIS — R471 Dysarthria and anarthria: Secondary | ICD-10-CM | POA: Diagnosis not present

## 2016-06-04 DIAGNOSIS — R4701 Aphasia: Secondary | ICD-10-CM | POA: Diagnosis not present

## 2016-06-05 DIAGNOSIS — R279 Unspecified lack of coordination: Secondary | ICD-10-CM | POA: Diagnosis not present

## 2016-06-05 DIAGNOSIS — R471 Dysarthria and anarthria: Secondary | ICD-10-CM | POA: Diagnosis not present

## 2016-06-05 DIAGNOSIS — R1312 Dysphagia, oropharyngeal phase: Secondary | ICD-10-CM | POA: Diagnosis not present

## 2016-06-05 DIAGNOSIS — R4701 Aphasia: Secondary | ICD-10-CM | POA: Diagnosis not present

## 2016-06-05 DIAGNOSIS — M6281 Muscle weakness (generalized): Secondary | ICD-10-CM | POA: Diagnosis not present

## 2016-06-05 DIAGNOSIS — R262 Difficulty in walking, not elsewhere classified: Secondary | ICD-10-CM | POA: Diagnosis not present

## 2016-06-05 DIAGNOSIS — I639 Cerebral infarction, unspecified: Secondary | ICD-10-CM | POA: Diagnosis not present

## 2016-06-06 DIAGNOSIS — R279 Unspecified lack of coordination: Secondary | ICD-10-CM | POA: Diagnosis not present

## 2016-06-06 DIAGNOSIS — M6281 Muscle weakness (generalized): Secondary | ICD-10-CM | POA: Diagnosis not present

## 2016-06-06 DIAGNOSIS — I639 Cerebral infarction, unspecified: Secondary | ICD-10-CM | POA: Diagnosis not present

## 2016-06-06 DIAGNOSIS — R262 Difficulty in walking, not elsewhere classified: Secondary | ICD-10-CM | POA: Diagnosis not present

## 2016-06-06 DIAGNOSIS — R471 Dysarthria and anarthria: Secondary | ICD-10-CM | POA: Diagnosis not present

## 2016-06-06 DIAGNOSIS — R4701 Aphasia: Secondary | ICD-10-CM | POA: Diagnosis not present

## 2016-06-06 DIAGNOSIS — R1312 Dysphagia, oropharyngeal phase: Secondary | ICD-10-CM | POA: Diagnosis not present

## 2016-06-09 DIAGNOSIS — I639 Cerebral infarction, unspecified: Secondary | ICD-10-CM | POA: Diagnosis not present

## 2016-06-09 DIAGNOSIS — R262 Difficulty in walking, not elsewhere classified: Secondary | ICD-10-CM | POA: Diagnosis not present

## 2016-06-09 DIAGNOSIS — M6281 Muscle weakness (generalized): Secondary | ICD-10-CM | POA: Diagnosis not present

## 2016-06-09 DIAGNOSIS — R279 Unspecified lack of coordination: Secondary | ICD-10-CM | POA: Diagnosis not present

## 2016-06-09 DIAGNOSIS — R4701 Aphasia: Secondary | ICD-10-CM | POA: Diagnosis not present

## 2016-06-09 DIAGNOSIS — R471 Dysarthria and anarthria: Secondary | ICD-10-CM | POA: Diagnosis not present

## 2016-06-09 DIAGNOSIS — R1312 Dysphagia, oropharyngeal phase: Secondary | ICD-10-CM | POA: Diagnosis not present

## 2016-06-10 DIAGNOSIS — R4701 Aphasia: Secondary | ICD-10-CM | POA: Diagnosis not present

## 2016-06-10 DIAGNOSIS — R1312 Dysphagia, oropharyngeal phase: Secondary | ICD-10-CM | POA: Diagnosis not present

## 2016-06-10 DIAGNOSIS — R262 Difficulty in walking, not elsewhere classified: Secondary | ICD-10-CM | POA: Diagnosis not present

## 2016-06-10 DIAGNOSIS — R471 Dysarthria and anarthria: Secondary | ICD-10-CM | POA: Diagnosis not present

## 2016-06-10 DIAGNOSIS — M6281 Muscle weakness (generalized): Secondary | ICD-10-CM | POA: Diagnosis not present

## 2016-06-10 DIAGNOSIS — R279 Unspecified lack of coordination: Secondary | ICD-10-CM | POA: Diagnosis not present

## 2016-06-10 DIAGNOSIS — I639 Cerebral infarction, unspecified: Secondary | ICD-10-CM | POA: Diagnosis not present

## 2016-06-11 DIAGNOSIS — R471 Dysarthria and anarthria: Secondary | ICD-10-CM | POA: Diagnosis not present

## 2016-06-11 DIAGNOSIS — I639 Cerebral infarction, unspecified: Secondary | ICD-10-CM | POA: Diagnosis not present

## 2016-06-11 DIAGNOSIS — R279 Unspecified lack of coordination: Secondary | ICD-10-CM | POA: Diagnosis not present

## 2016-06-11 DIAGNOSIS — R4701 Aphasia: Secondary | ICD-10-CM | POA: Diagnosis not present

## 2016-06-11 DIAGNOSIS — M6281 Muscle weakness (generalized): Secondary | ICD-10-CM | POA: Diagnosis not present

## 2016-06-11 DIAGNOSIS — R262 Difficulty in walking, not elsewhere classified: Secondary | ICD-10-CM | POA: Diagnosis not present

## 2016-06-11 DIAGNOSIS — R1312 Dysphagia, oropharyngeal phase: Secondary | ICD-10-CM | POA: Diagnosis not present

## 2016-06-12 DIAGNOSIS — I639 Cerebral infarction, unspecified: Secondary | ICD-10-CM | POA: Diagnosis not present

## 2016-06-12 DIAGNOSIS — R1312 Dysphagia, oropharyngeal phase: Secondary | ICD-10-CM | POA: Diagnosis not present

## 2016-06-12 DIAGNOSIS — R279 Unspecified lack of coordination: Secondary | ICD-10-CM | POA: Diagnosis not present

## 2016-06-12 DIAGNOSIS — R471 Dysarthria and anarthria: Secondary | ICD-10-CM | POA: Diagnosis not present

## 2016-06-12 DIAGNOSIS — R4701 Aphasia: Secondary | ICD-10-CM | POA: Diagnosis not present

## 2016-06-12 DIAGNOSIS — R262 Difficulty in walking, not elsewhere classified: Secondary | ICD-10-CM | POA: Diagnosis not present

## 2016-06-12 DIAGNOSIS — M6281 Muscle weakness (generalized): Secondary | ICD-10-CM | POA: Diagnosis not present

## 2016-06-16 DIAGNOSIS — R4701 Aphasia: Secondary | ICD-10-CM | POA: Diagnosis not present

## 2016-06-16 DIAGNOSIS — R1312 Dysphagia, oropharyngeal phase: Secondary | ICD-10-CM | POA: Diagnosis not present

## 2016-06-16 DIAGNOSIS — I639 Cerebral infarction, unspecified: Secondary | ICD-10-CM | POA: Diagnosis not present

## 2016-06-16 DIAGNOSIS — M6281 Muscle weakness (generalized): Secondary | ICD-10-CM | POA: Diagnosis not present

## 2016-06-16 DIAGNOSIS — R471 Dysarthria and anarthria: Secondary | ICD-10-CM | POA: Diagnosis not present

## 2016-06-16 DIAGNOSIS — R262 Difficulty in walking, not elsewhere classified: Secondary | ICD-10-CM | POA: Diagnosis not present

## 2016-06-16 DIAGNOSIS — R279 Unspecified lack of coordination: Secondary | ICD-10-CM | POA: Diagnosis not present

## 2016-06-17 DIAGNOSIS — I639 Cerebral infarction, unspecified: Secondary | ICD-10-CM | POA: Diagnosis not present

## 2016-06-17 DIAGNOSIS — R279 Unspecified lack of coordination: Secondary | ICD-10-CM | POA: Diagnosis not present

## 2016-06-17 DIAGNOSIS — R262 Difficulty in walking, not elsewhere classified: Secondary | ICD-10-CM | POA: Diagnosis not present

## 2016-06-17 DIAGNOSIS — R4701 Aphasia: Secondary | ICD-10-CM | POA: Diagnosis not present

## 2016-06-17 DIAGNOSIS — R471 Dysarthria and anarthria: Secondary | ICD-10-CM | POA: Diagnosis not present

## 2016-06-17 DIAGNOSIS — M6281 Muscle weakness (generalized): Secondary | ICD-10-CM | POA: Diagnosis not present

## 2016-06-17 DIAGNOSIS — R1312 Dysphagia, oropharyngeal phase: Secondary | ICD-10-CM | POA: Diagnosis not present

## 2016-06-18 DIAGNOSIS — R4701 Aphasia: Secondary | ICD-10-CM | POA: Diagnosis not present

## 2016-06-18 DIAGNOSIS — R471 Dysarthria and anarthria: Secondary | ICD-10-CM | POA: Diagnosis not present

## 2016-06-18 DIAGNOSIS — R1312 Dysphagia, oropharyngeal phase: Secondary | ICD-10-CM | POA: Diagnosis not present

## 2016-06-18 DIAGNOSIS — R279 Unspecified lack of coordination: Secondary | ICD-10-CM | POA: Diagnosis not present

## 2016-06-18 DIAGNOSIS — M6281 Muscle weakness (generalized): Secondary | ICD-10-CM | POA: Diagnosis not present

## 2016-06-18 DIAGNOSIS — R262 Difficulty in walking, not elsewhere classified: Secondary | ICD-10-CM | POA: Diagnosis not present

## 2016-06-18 DIAGNOSIS — I639 Cerebral infarction, unspecified: Secondary | ICD-10-CM | POA: Diagnosis not present

## 2016-06-19 DIAGNOSIS — R1312 Dysphagia, oropharyngeal phase: Secondary | ICD-10-CM | POA: Diagnosis not present

## 2016-06-19 DIAGNOSIS — M6281 Muscle weakness (generalized): Secondary | ICD-10-CM | POA: Diagnosis not present

## 2016-06-19 DIAGNOSIS — R279 Unspecified lack of coordination: Secondary | ICD-10-CM | POA: Diagnosis not present

## 2016-06-19 DIAGNOSIS — I639 Cerebral infarction, unspecified: Secondary | ICD-10-CM | POA: Diagnosis not present

## 2016-06-19 DIAGNOSIS — R262 Difficulty in walking, not elsewhere classified: Secondary | ICD-10-CM | POA: Diagnosis not present

## 2016-06-19 DIAGNOSIS — R471 Dysarthria and anarthria: Secondary | ICD-10-CM | POA: Diagnosis not present

## 2016-06-19 DIAGNOSIS — R4701 Aphasia: Secondary | ICD-10-CM | POA: Diagnosis not present

## 2016-06-20 DIAGNOSIS — I639 Cerebral infarction, unspecified: Secondary | ICD-10-CM | POA: Diagnosis not present

## 2016-06-20 DIAGNOSIS — M6281 Muscle weakness (generalized): Secondary | ICD-10-CM | POA: Diagnosis not present

## 2016-06-20 DIAGNOSIS — R262 Difficulty in walking, not elsewhere classified: Secondary | ICD-10-CM | POA: Diagnosis not present

## 2016-06-20 DIAGNOSIS — R279 Unspecified lack of coordination: Secondary | ICD-10-CM | POA: Diagnosis not present

## 2016-06-20 DIAGNOSIS — R4701 Aphasia: Secondary | ICD-10-CM | POA: Diagnosis not present

## 2016-06-20 DIAGNOSIS — R471 Dysarthria and anarthria: Secondary | ICD-10-CM | POA: Diagnosis not present

## 2016-06-20 DIAGNOSIS — R1312 Dysphagia, oropharyngeal phase: Secondary | ICD-10-CM | POA: Diagnosis not present

## 2016-06-21 DIAGNOSIS — R1312 Dysphagia, oropharyngeal phase: Secondary | ICD-10-CM | POA: Diagnosis not present

## 2016-06-21 DIAGNOSIS — R4701 Aphasia: Secondary | ICD-10-CM | POA: Diagnosis not present

## 2016-06-21 DIAGNOSIS — R471 Dysarthria and anarthria: Secondary | ICD-10-CM | POA: Diagnosis not present

## 2016-06-21 DIAGNOSIS — R279 Unspecified lack of coordination: Secondary | ICD-10-CM | POA: Diagnosis not present

## 2016-06-21 DIAGNOSIS — I639 Cerebral infarction, unspecified: Secondary | ICD-10-CM | POA: Diagnosis not present

## 2016-06-21 DIAGNOSIS — R262 Difficulty in walking, not elsewhere classified: Secondary | ICD-10-CM | POA: Diagnosis not present

## 2016-06-21 DIAGNOSIS — M6281 Muscle weakness (generalized): Secondary | ICD-10-CM | POA: Diagnosis not present

## 2016-06-24 DIAGNOSIS — I639 Cerebral infarction, unspecified: Secondary | ICD-10-CM | POA: Diagnosis not present

## 2016-06-24 DIAGNOSIS — R262 Difficulty in walking, not elsewhere classified: Secondary | ICD-10-CM | POA: Diagnosis not present

## 2016-06-24 DIAGNOSIS — M6281 Muscle weakness (generalized): Secondary | ICD-10-CM | POA: Diagnosis not present

## 2016-06-24 DIAGNOSIS — R471 Dysarthria and anarthria: Secondary | ICD-10-CM | POA: Diagnosis not present

## 2016-06-24 DIAGNOSIS — R1312 Dysphagia, oropharyngeal phase: Secondary | ICD-10-CM | POA: Diagnosis not present

## 2016-06-24 DIAGNOSIS — R4701 Aphasia: Secondary | ICD-10-CM | POA: Diagnosis not present

## 2016-06-24 DIAGNOSIS — R279 Unspecified lack of coordination: Secondary | ICD-10-CM | POA: Diagnosis not present

## 2016-06-25 DIAGNOSIS — R4701 Aphasia: Secondary | ICD-10-CM | POA: Diagnosis not present

## 2016-06-25 DIAGNOSIS — M6281 Muscle weakness (generalized): Secondary | ICD-10-CM | POA: Diagnosis not present

## 2016-06-25 DIAGNOSIS — R471 Dysarthria and anarthria: Secondary | ICD-10-CM | POA: Diagnosis not present

## 2016-06-25 DIAGNOSIS — R279 Unspecified lack of coordination: Secondary | ICD-10-CM | POA: Diagnosis not present

## 2016-06-25 DIAGNOSIS — R1312 Dysphagia, oropharyngeal phase: Secondary | ICD-10-CM | POA: Diagnosis not present

## 2016-06-25 DIAGNOSIS — I639 Cerebral infarction, unspecified: Secondary | ICD-10-CM | POA: Diagnosis not present

## 2016-06-25 DIAGNOSIS — R262 Difficulty in walking, not elsewhere classified: Secondary | ICD-10-CM | POA: Diagnosis not present

## 2016-06-26 DIAGNOSIS — R262 Difficulty in walking, not elsewhere classified: Secondary | ICD-10-CM | POA: Diagnosis not present

## 2016-06-26 DIAGNOSIS — I639 Cerebral infarction, unspecified: Secondary | ICD-10-CM | POA: Diagnosis not present

## 2016-06-26 DIAGNOSIS — R279 Unspecified lack of coordination: Secondary | ICD-10-CM | POA: Diagnosis not present

## 2016-06-26 DIAGNOSIS — R4701 Aphasia: Secondary | ICD-10-CM | POA: Diagnosis not present

## 2016-06-26 DIAGNOSIS — M6281 Muscle weakness (generalized): Secondary | ICD-10-CM | POA: Diagnosis not present

## 2016-06-26 DIAGNOSIS — R1312 Dysphagia, oropharyngeal phase: Secondary | ICD-10-CM | POA: Diagnosis not present

## 2016-06-26 DIAGNOSIS — R471 Dysarthria and anarthria: Secondary | ICD-10-CM | POA: Diagnosis not present

## 2016-06-27 DIAGNOSIS — R279 Unspecified lack of coordination: Secondary | ICD-10-CM | POA: Diagnosis not present

## 2016-06-27 DIAGNOSIS — R4701 Aphasia: Secondary | ICD-10-CM | POA: Diagnosis not present

## 2016-06-27 DIAGNOSIS — R262 Difficulty in walking, not elsewhere classified: Secondary | ICD-10-CM | POA: Diagnosis not present

## 2016-06-27 DIAGNOSIS — M6281 Muscle weakness (generalized): Secondary | ICD-10-CM | POA: Diagnosis not present

## 2016-06-27 DIAGNOSIS — I639 Cerebral infarction, unspecified: Secondary | ICD-10-CM | POA: Diagnosis not present

## 2016-06-27 DIAGNOSIS — R1312 Dysphagia, oropharyngeal phase: Secondary | ICD-10-CM | POA: Diagnosis not present

## 2016-06-27 DIAGNOSIS — R471 Dysarthria and anarthria: Secondary | ICD-10-CM | POA: Diagnosis not present

## 2016-06-30 DIAGNOSIS — R471 Dysarthria and anarthria: Secondary | ICD-10-CM | POA: Diagnosis not present

## 2016-06-30 DIAGNOSIS — I639 Cerebral infarction, unspecified: Secondary | ICD-10-CM | POA: Diagnosis not present

## 2016-06-30 DIAGNOSIS — R4701 Aphasia: Secondary | ICD-10-CM | POA: Diagnosis not present

## 2016-06-30 DIAGNOSIS — R1312 Dysphagia, oropharyngeal phase: Secondary | ICD-10-CM | POA: Diagnosis not present

## 2016-07-01 DIAGNOSIS — R471 Dysarthria and anarthria: Secondary | ICD-10-CM | POA: Diagnosis not present

## 2016-07-01 DIAGNOSIS — I639 Cerebral infarction, unspecified: Secondary | ICD-10-CM | POA: Diagnosis not present

## 2016-07-01 DIAGNOSIS — R4701 Aphasia: Secondary | ICD-10-CM | POA: Diagnosis not present

## 2016-07-01 DIAGNOSIS — R1312 Dysphagia, oropharyngeal phase: Secondary | ICD-10-CM | POA: Diagnosis not present

## 2016-07-02 DIAGNOSIS — R1312 Dysphagia, oropharyngeal phase: Secondary | ICD-10-CM | POA: Diagnosis not present

## 2016-07-02 DIAGNOSIS — R4701 Aphasia: Secondary | ICD-10-CM | POA: Diagnosis not present

## 2016-07-02 DIAGNOSIS — I639 Cerebral infarction, unspecified: Secondary | ICD-10-CM | POA: Diagnosis not present

## 2016-07-02 DIAGNOSIS — R471 Dysarthria and anarthria: Secondary | ICD-10-CM | POA: Diagnosis not present

## 2016-07-08 DIAGNOSIS — R471 Dysarthria and anarthria: Secondary | ICD-10-CM | POA: Diagnosis not present

## 2016-07-08 DIAGNOSIS — I639 Cerebral infarction, unspecified: Secondary | ICD-10-CM | POA: Diagnosis not present

## 2016-07-08 DIAGNOSIS — R4701 Aphasia: Secondary | ICD-10-CM | POA: Diagnosis not present

## 2016-07-08 DIAGNOSIS — R1312 Dysphagia, oropharyngeal phase: Secondary | ICD-10-CM | POA: Diagnosis not present

## 2016-07-10 DIAGNOSIS — R1312 Dysphagia, oropharyngeal phase: Secondary | ICD-10-CM | POA: Diagnosis not present

## 2016-07-10 DIAGNOSIS — R471 Dysarthria and anarthria: Secondary | ICD-10-CM | POA: Diagnosis not present

## 2016-07-10 DIAGNOSIS — R4701 Aphasia: Secondary | ICD-10-CM | POA: Diagnosis not present

## 2016-07-10 DIAGNOSIS — I639 Cerebral infarction, unspecified: Secondary | ICD-10-CM | POA: Diagnosis not present

## 2016-07-11 DIAGNOSIS — R4701 Aphasia: Secondary | ICD-10-CM | POA: Diagnosis not present

## 2016-07-11 DIAGNOSIS — I639 Cerebral infarction, unspecified: Secondary | ICD-10-CM | POA: Diagnosis not present

## 2016-07-11 DIAGNOSIS — R471 Dysarthria and anarthria: Secondary | ICD-10-CM | POA: Diagnosis not present

## 2016-07-11 DIAGNOSIS — R1312 Dysphagia, oropharyngeal phase: Secondary | ICD-10-CM | POA: Diagnosis not present

## 2016-07-14 DIAGNOSIS — R1312 Dysphagia, oropharyngeal phase: Secondary | ICD-10-CM | POA: Diagnosis not present

## 2016-07-14 DIAGNOSIS — I639 Cerebral infarction, unspecified: Secondary | ICD-10-CM | POA: Diagnosis not present

## 2016-07-14 DIAGNOSIS — R4701 Aphasia: Secondary | ICD-10-CM | POA: Diagnosis not present

## 2016-07-14 DIAGNOSIS — R471 Dysarthria and anarthria: Secondary | ICD-10-CM | POA: Diagnosis not present

## 2016-07-18 DIAGNOSIS — I639 Cerebral infarction, unspecified: Secondary | ICD-10-CM | POA: Diagnosis not present

## 2016-07-18 DIAGNOSIS — R471 Dysarthria and anarthria: Secondary | ICD-10-CM | POA: Diagnosis not present

## 2016-07-18 DIAGNOSIS — R4701 Aphasia: Secondary | ICD-10-CM | POA: Diagnosis not present

## 2016-07-18 DIAGNOSIS — R1312 Dysphagia, oropharyngeal phase: Secondary | ICD-10-CM | POA: Diagnosis not present

## 2016-07-19 DIAGNOSIS — R1312 Dysphagia, oropharyngeal phase: Secondary | ICD-10-CM | POA: Diagnosis not present

## 2016-07-19 DIAGNOSIS — R471 Dysarthria and anarthria: Secondary | ICD-10-CM | POA: Diagnosis not present

## 2016-07-19 DIAGNOSIS — I639 Cerebral infarction, unspecified: Secondary | ICD-10-CM | POA: Diagnosis not present

## 2016-07-19 DIAGNOSIS — R4701 Aphasia: Secondary | ICD-10-CM | POA: Diagnosis not present

## 2016-07-21 DIAGNOSIS — R4701 Aphasia: Secondary | ICD-10-CM | POA: Diagnosis not present

## 2016-07-21 DIAGNOSIS — R1312 Dysphagia, oropharyngeal phase: Secondary | ICD-10-CM | POA: Diagnosis not present

## 2016-07-21 DIAGNOSIS — I639 Cerebral infarction, unspecified: Secondary | ICD-10-CM | POA: Diagnosis not present

## 2016-07-21 DIAGNOSIS — R471 Dysarthria and anarthria: Secondary | ICD-10-CM | POA: Diagnosis not present

## 2016-07-22 DIAGNOSIS — I639 Cerebral infarction, unspecified: Secondary | ICD-10-CM | POA: Diagnosis not present

## 2016-07-22 DIAGNOSIS — E785 Hyperlipidemia, unspecified: Secondary | ICD-10-CM | POA: Diagnosis not present

## 2016-07-22 DIAGNOSIS — R4701 Aphasia: Secondary | ICD-10-CM | POA: Diagnosis not present

## 2016-07-23 DIAGNOSIS — R4701 Aphasia: Secondary | ICD-10-CM | POA: Diagnosis not present

## 2016-07-23 DIAGNOSIS — R1312 Dysphagia, oropharyngeal phase: Secondary | ICD-10-CM | POA: Diagnosis not present

## 2016-07-23 DIAGNOSIS — R471 Dysarthria and anarthria: Secondary | ICD-10-CM | POA: Diagnosis not present

## 2016-07-23 DIAGNOSIS — I639 Cerebral infarction, unspecified: Secondary | ICD-10-CM | POA: Diagnosis not present

## 2016-07-25 DIAGNOSIS — R4701 Aphasia: Secondary | ICD-10-CM | POA: Diagnosis not present

## 2016-07-25 DIAGNOSIS — R471 Dysarthria and anarthria: Secondary | ICD-10-CM | POA: Diagnosis not present

## 2016-07-25 DIAGNOSIS — I639 Cerebral infarction, unspecified: Secondary | ICD-10-CM | POA: Diagnosis not present

## 2016-07-25 DIAGNOSIS — R1312 Dysphagia, oropharyngeal phase: Secondary | ICD-10-CM | POA: Diagnosis not present

## 2016-07-29 DIAGNOSIS — R1312 Dysphagia, oropharyngeal phase: Secondary | ICD-10-CM | POA: Diagnosis not present

## 2016-07-29 DIAGNOSIS — I639 Cerebral infarction, unspecified: Secondary | ICD-10-CM | POA: Diagnosis not present

## 2016-07-29 DIAGNOSIS — R4701 Aphasia: Secondary | ICD-10-CM | POA: Diagnosis not present

## 2016-07-29 DIAGNOSIS — R471 Dysarthria and anarthria: Secondary | ICD-10-CM | POA: Diagnosis not present

## 2016-07-31 DIAGNOSIS — R1312 Dysphagia, oropharyngeal phase: Secondary | ICD-10-CM | POA: Diagnosis not present

## 2016-07-31 DIAGNOSIS — R471 Dysarthria and anarthria: Secondary | ICD-10-CM | POA: Diagnosis not present

## 2016-07-31 DIAGNOSIS — I639 Cerebral infarction, unspecified: Secondary | ICD-10-CM | POA: Diagnosis not present

## 2016-07-31 DIAGNOSIS — R4701 Aphasia: Secondary | ICD-10-CM | POA: Diagnosis not present

## 2016-08-01 DIAGNOSIS — I639 Cerebral infarction, unspecified: Secondary | ICD-10-CM | POA: Diagnosis not present

## 2016-08-01 DIAGNOSIS — R471 Dysarthria and anarthria: Secondary | ICD-10-CM | POA: Diagnosis not present

## 2016-08-01 DIAGNOSIS — R1312 Dysphagia, oropharyngeal phase: Secondary | ICD-10-CM | POA: Diagnosis not present

## 2016-08-01 DIAGNOSIS — R4701 Aphasia: Secondary | ICD-10-CM | POA: Diagnosis not present

## 2016-08-13 DIAGNOSIS — I639 Cerebral infarction, unspecified: Secondary | ICD-10-CM | POA: Diagnosis not present

## 2016-10-08 DIAGNOSIS — I639 Cerebral infarction, unspecified: Secondary | ICD-10-CM | POA: Diagnosis not present

## 2016-10-29 DIAGNOSIS — M6281 Muscle weakness (generalized): Secondary | ICD-10-CM | POA: Diagnosis not present

## 2016-10-29 DIAGNOSIS — E785 Hyperlipidemia, unspecified: Secondary | ICD-10-CM | POA: Diagnosis not present

## 2016-10-29 DIAGNOSIS — I639 Cerebral infarction, unspecified: Secondary | ICD-10-CM | POA: Diagnosis not present

## 2016-10-29 DIAGNOSIS — R1312 Dysphagia, oropharyngeal phase: Secondary | ICD-10-CM | POA: Diagnosis not present

## 2016-11-26 DIAGNOSIS — G8191 Hemiplegia, unspecified affecting right dominant side: Secondary | ICD-10-CM | POA: Diagnosis not present

## 2016-11-26 DIAGNOSIS — M2450 Contracture, unspecified joint: Secondary | ICD-10-CM | POA: Diagnosis not present

## 2016-11-26 DIAGNOSIS — M6281 Muscle weakness (generalized): Secondary | ICD-10-CM | POA: Diagnosis not present

## 2016-11-27 DIAGNOSIS — M2450 Contracture, unspecified joint: Secondary | ICD-10-CM | POA: Diagnosis not present

## 2016-11-27 DIAGNOSIS — M6281 Muscle weakness (generalized): Secondary | ICD-10-CM | POA: Diagnosis not present

## 2016-11-27 DIAGNOSIS — G8191 Hemiplegia, unspecified affecting right dominant side: Secondary | ICD-10-CM | POA: Diagnosis not present

## 2016-12-01 DIAGNOSIS — M2450 Contracture, unspecified joint: Secondary | ICD-10-CM | POA: Diagnosis not present

## 2016-12-01 DIAGNOSIS — M6281 Muscle weakness (generalized): Secondary | ICD-10-CM | POA: Diagnosis not present

## 2016-12-01 DIAGNOSIS — G8191 Hemiplegia, unspecified affecting right dominant side: Secondary | ICD-10-CM | POA: Diagnosis not present

## 2016-12-02 DIAGNOSIS — M6281 Muscle weakness (generalized): Secondary | ICD-10-CM | POA: Diagnosis not present

## 2016-12-02 DIAGNOSIS — M2450 Contracture, unspecified joint: Secondary | ICD-10-CM | POA: Diagnosis not present

## 2016-12-02 DIAGNOSIS — G8191 Hemiplegia, unspecified affecting right dominant side: Secondary | ICD-10-CM | POA: Diagnosis not present

## 2016-12-03 DIAGNOSIS — M6281 Muscle weakness (generalized): Secondary | ICD-10-CM | POA: Diagnosis not present

## 2016-12-03 DIAGNOSIS — M2450 Contracture, unspecified joint: Secondary | ICD-10-CM | POA: Diagnosis not present

## 2016-12-03 DIAGNOSIS — G8191 Hemiplegia, unspecified affecting right dominant side: Secondary | ICD-10-CM | POA: Diagnosis not present

## 2016-12-04 DIAGNOSIS — M6281 Muscle weakness (generalized): Secondary | ICD-10-CM | POA: Diagnosis not present

## 2016-12-04 DIAGNOSIS — M2450 Contracture, unspecified joint: Secondary | ICD-10-CM | POA: Diagnosis not present

## 2016-12-04 DIAGNOSIS — G8191 Hemiplegia, unspecified affecting right dominant side: Secondary | ICD-10-CM | POA: Diagnosis not present

## 2016-12-08 DIAGNOSIS — G8191 Hemiplegia, unspecified affecting right dominant side: Secondary | ICD-10-CM | POA: Diagnosis not present

## 2016-12-08 DIAGNOSIS — M6281 Muscle weakness (generalized): Secondary | ICD-10-CM | POA: Diagnosis not present

## 2016-12-08 DIAGNOSIS — M2450 Contracture, unspecified joint: Secondary | ICD-10-CM | POA: Diagnosis not present

## 2016-12-11 DIAGNOSIS — H6123 Impacted cerumen, bilateral: Secondary | ICD-10-CM | POA: Diagnosis not present

## 2016-12-11 DIAGNOSIS — H903 Sensorineural hearing loss, bilateral: Secondary | ICD-10-CM | POA: Diagnosis not present

## 2016-12-12 DIAGNOSIS — I639 Cerebral infarction, unspecified: Secondary | ICD-10-CM | POA: Diagnosis not present

## 2016-12-17 DIAGNOSIS — M2450 Contracture, unspecified joint: Secondary | ICD-10-CM | POA: Diagnosis not present

## 2016-12-17 DIAGNOSIS — M6281 Muscle weakness (generalized): Secondary | ICD-10-CM | POA: Diagnosis not present

## 2016-12-17 DIAGNOSIS — G8191 Hemiplegia, unspecified affecting right dominant side: Secondary | ICD-10-CM | POA: Diagnosis not present

## 2016-12-23 DIAGNOSIS — M2450 Contracture, unspecified joint: Secondary | ICD-10-CM | POA: Diagnosis not present

## 2016-12-23 DIAGNOSIS — M6281 Muscle weakness (generalized): Secondary | ICD-10-CM | POA: Diagnosis not present

## 2016-12-23 DIAGNOSIS — G8191 Hemiplegia, unspecified affecting right dominant side: Secondary | ICD-10-CM | POA: Diagnosis not present

## 2016-12-26 DIAGNOSIS — M6281 Muscle weakness (generalized): Secondary | ICD-10-CM | POA: Diagnosis not present

## 2016-12-26 DIAGNOSIS — G8191 Hemiplegia, unspecified affecting right dominant side: Secondary | ICD-10-CM | POA: Diagnosis not present

## 2016-12-26 DIAGNOSIS — M2450 Contracture, unspecified joint: Secondary | ICD-10-CM | POA: Diagnosis not present

## 2016-12-29 DIAGNOSIS — M6281 Muscle weakness (generalized): Secondary | ICD-10-CM | POA: Diagnosis not present

## 2016-12-29 DIAGNOSIS — G8191 Hemiplegia, unspecified affecting right dominant side: Secondary | ICD-10-CM | POA: Diagnosis not present

## 2016-12-29 DIAGNOSIS — R4701 Aphasia: Secondary | ICD-10-CM | POA: Diagnosis not present

## 2016-12-29 DIAGNOSIS — I69390 Apraxia following cerebral infarction: Secondary | ICD-10-CM | POA: Diagnosis not present

## 2016-12-29 DIAGNOSIS — M2450 Contracture, unspecified joint: Secondary | ICD-10-CM | POA: Diagnosis not present

## 2016-12-29 DIAGNOSIS — I639 Cerebral infarction, unspecified: Secondary | ICD-10-CM | POA: Diagnosis not present

## 2016-12-30 DIAGNOSIS — R4701 Aphasia: Secondary | ICD-10-CM | POA: Diagnosis not present

## 2016-12-30 DIAGNOSIS — G8191 Hemiplegia, unspecified affecting right dominant side: Secondary | ICD-10-CM | POA: Diagnosis not present

## 2016-12-30 DIAGNOSIS — I69390 Apraxia following cerebral infarction: Secondary | ICD-10-CM | POA: Diagnosis not present

## 2016-12-30 DIAGNOSIS — M6281 Muscle weakness (generalized): Secondary | ICD-10-CM | POA: Diagnosis not present

## 2016-12-30 DIAGNOSIS — M2450 Contracture, unspecified joint: Secondary | ICD-10-CM | POA: Diagnosis not present

## 2016-12-30 DIAGNOSIS — I639 Cerebral infarction, unspecified: Secondary | ICD-10-CM | POA: Diagnosis not present

## 2017-01-07 DIAGNOSIS — I69353 Hemiplegia and hemiparesis following cerebral infarction affecting right non-dominant side: Secondary | ICD-10-CM | POA: Diagnosis not present

## 2017-01-19 DIAGNOSIS — I69390 Apraxia following cerebral infarction: Secondary | ICD-10-CM | POA: Diagnosis not present

## 2017-01-19 DIAGNOSIS — M2450 Contracture, unspecified joint: Secondary | ICD-10-CM | POA: Diagnosis not present

## 2017-01-19 DIAGNOSIS — M6281 Muscle weakness (generalized): Secondary | ICD-10-CM | POA: Diagnosis not present

## 2017-01-19 DIAGNOSIS — I639 Cerebral infarction, unspecified: Secondary | ICD-10-CM | POA: Diagnosis not present

## 2017-01-19 DIAGNOSIS — G8191 Hemiplegia, unspecified affecting right dominant side: Secondary | ICD-10-CM | POA: Diagnosis not present

## 2017-01-19 DIAGNOSIS — R4701 Aphasia: Secondary | ICD-10-CM | POA: Diagnosis not present

## 2017-01-20 DIAGNOSIS — I69390 Apraxia following cerebral infarction: Secondary | ICD-10-CM | POA: Diagnosis not present

## 2017-01-20 DIAGNOSIS — I639 Cerebral infarction, unspecified: Secondary | ICD-10-CM | POA: Diagnosis not present

## 2017-01-20 DIAGNOSIS — R4701 Aphasia: Secondary | ICD-10-CM | POA: Diagnosis not present

## 2017-01-20 DIAGNOSIS — G8191 Hemiplegia, unspecified affecting right dominant side: Secondary | ICD-10-CM | POA: Diagnosis not present

## 2017-01-20 DIAGNOSIS — M6281 Muscle weakness (generalized): Secondary | ICD-10-CM | POA: Diagnosis not present

## 2017-01-20 DIAGNOSIS — M2450 Contracture, unspecified joint: Secondary | ICD-10-CM | POA: Diagnosis not present

## 2017-01-21 DIAGNOSIS — R4701 Aphasia: Secondary | ICD-10-CM | POA: Diagnosis not present

## 2017-01-21 DIAGNOSIS — G8191 Hemiplegia, unspecified affecting right dominant side: Secondary | ICD-10-CM | POA: Diagnosis not present

## 2017-01-21 DIAGNOSIS — M6281 Muscle weakness (generalized): Secondary | ICD-10-CM | POA: Diagnosis not present

## 2017-01-21 DIAGNOSIS — I639 Cerebral infarction, unspecified: Secondary | ICD-10-CM | POA: Diagnosis not present

## 2017-01-21 DIAGNOSIS — M2450 Contracture, unspecified joint: Secondary | ICD-10-CM | POA: Diagnosis not present

## 2017-01-21 DIAGNOSIS — I69390 Apraxia following cerebral infarction: Secondary | ICD-10-CM | POA: Diagnosis not present

## 2017-01-22 DIAGNOSIS — M2450 Contracture, unspecified joint: Secondary | ICD-10-CM | POA: Diagnosis not present

## 2017-01-22 DIAGNOSIS — M6281 Muscle weakness (generalized): Secondary | ICD-10-CM | POA: Diagnosis not present

## 2017-01-22 DIAGNOSIS — I69390 Apraxia following cerebral infarction: Secondary | ICD-10-CM | POA: Diagnosis not present

## 2017-01-22 DIAGNOSIS — G8191 Hemiplegia, unspecified affecting right dominant side: Secondary | ICD-10-CM | POA: Diagnosis not present

## 2017-01-22 DIAGNOSIS — R4701 Aphasia: Secondary | ICD-10-CM | POA: Diagnosis not present

## 2017-01-22 DIAGNOSIS — I639 Cerebral infarction, unspecified: Secondary | ICD-10-CM | POA: Diagnosis not present

## 2017-01-26 ENCOUNTER — Emergency Department: Payer: Medicare Other

## 2017-01-26 ENCOUNTER — Inpatient Hospital Stay
Admission: EM | Admit: 2017-01-26 | Discharge: 2017-01-28 | DRG: 282 | Disposition: A | Discharge status: Skilled Nursing Facility | Payer: Medicare Other | Attending: Internal Medicine | Admitting: Internal Medicine

## 2017-01-26 ENCOUNTER — Encounter: Payer: Self-pay | Admitting: Emergency Medicine

## 2017-01-26 DIAGNOSIS — Z9049 Acquired absence of other specified parts of digestive tract: Secondary | ICD-10-CM | POA: Diagnosis not present

## 2017-01-26 DIAGNOSIS — Z8249 Family history of ischemic heart disease and other diseases of the circulatory system: Secondary | ICD-10-CM | POA: Diagnosis not present

## 2017-01-26 DIAGNOSIS — Z90710 Acquired absence of both cervix and uterus: Secondary | ICD-10-CM | POA: Diagnosis not present

## 2017-01-26 DIAGNOSIS — K219 Gastro-esophageal reflux disease without esophagitis: Secondary | ICD-10-CM | POA: Diagnosis present

## 2017-01-26 DIAGNOSIS — K760 Fatty (change of) liver, not elsewhere classified: Secondary | ICD-10-CM | POA: Diagnosis not present

## 2017-01-26 DIAGNOSIS — Z7902 Long term (current) use of antithrombotics/antiplatelets: Secondary | ICD-10-CM

## 2017-01-26 DIAGNOSIS — F329 Major depressive disorder, single episode, unspecified: Secondary | ICD-10-CM | POA: Diagnosis present

## 2017-01-26 DIAGNOSIS — Z7982 Long term (current) use of aspirin: Secondary | ICD-10-CM | POA: Diagnosis not present

## 2017-01-26 DIAGNOSIS — R079 Chest pain, unspecified: Secondary | ICD-10-CM | POA: Diagnosis not present

## 2017-01-26 DIAGNOSIS — I69322 Dysarthria following cerebral infarction: Secondary | ICD-10-CM

## 2017-01-26 DIAGNOSIS — Z833 Family history of diabetes mellitus: Secondary | ICD-10-CM

## 2017-01-26 DIAGNOSIS — I4510 Unspecified right bundle-branch block: Secondary | ICD-10-CM | POA: Diagnosis not present

## 2017-01-26 DIAGNOSIS — I214 Non-ST elevation (NSTEMI) myocardial infarction: Principal | ICD-10-CM | POA: Diagnosis present

## 2017-01-26 DIAGNOSIS — Z7401 Bed confinement status: Secondary | ICD-10-CM

## 2017-01-26 DIAGNOSIS — I10 Essential (primary) hypertension: Secondary | ICD-10-CM | POA: Diagnosis present

## 2017-01-26 DIAGNOSIS — Z823 Family history of stroke: Secondary | ICD-10-CM

## 2017-01-26 DIAGNOSIS — I340 Nonrheumatic mitral (valve) insufficiency: Secondary | ICD-10-CM | POA: Diagnosis not present

## 2017-01-26 DIAGNOSIS — Z66 Do not resuscitate: Secondary | ICD-10-CM | POA: Diagnosis present

## 2017-01-26 DIAGNOSIS — R7989 Other specified abnormal findings of blood chemistry: Secondary | ICD-10-CM | POA: Diagnosis not present

## 2017-01-26 DIAGNOSIS — I2510 Atherosclerotic heart disease of native coronary artery without angina pectoris: Secondary | ICD-10-CM | POA: Diagnosis not present

## 2017-01-26 DIAGNOSIS — I252 Old myocardial infarction: Secondary | ICD-10-CM | POA: Diagnosis not present

## 2017-01-26 DIAGNOSIS — Z8349 Family history of other endocrine, nutritional and metabolic diseases: Secondary | ICD-10-CM | POA: Diagnosis not present

## 2017-01-26 DIAGNOSIS — I25110 Atherosclerotic heart disease of native coronary artery with unstable angina pectoris: Secondary | ICD-10-CM | POA: Diagnosis present

## 2017-01-26 DIAGNOSIS — R4701 Aphasia: Secondary | ICD-10-CM | POA: Diagnosis not present

## 2017-01-26 DIAGNOSIS — Z8673 Personal history of transient ischemic attack (TIA), and cerebral infarction without residual deficits: Secondary | ICD-10-CM | POA: Diagnosis not present

## 2017-01-26 HISTORY — DX: Atherosclerotic heart disease of native coronary artery without angina pectoris: I25.10

## 2017-01-26 LAB — HEPATIC FUNCTION PANEL
ALBUMIN: 4.2 g/dL (ref 3.5–5.0)
ALK PHOS: 72 U/L (ref 38–126)
ALT: 32 U/L (ref 14–54)
AST: 54 U/L — ABNORMAL HIGH (ref 15–41)
BILIRUBIN TOTAL: 1.5 mg/dL — AB (ref 0.3–1.2)
Bilirubin, Direct: 0.3 mg/dL (ref 0.1–0.5)
Indirect Bilirubin: 1.2 mg/dL — ABNORMAL HIGH (ref 0.3–0.9)
Total Protein: 7.4 g/dL (ref 6.5–8.1)

## 2017-01-26 LAB — BASIC METABOLIC PANEL
ANION GAP: 12 (ref 5–15)
BUN: 15 mg/dL (ref 6–20)
CHLORIDE: 102 mmol/L (ref 101–111)
CO2: 25 mmol/L (ref 22–32)
Calcium: 9.4 mg/dL (ref 8.9–10.3)
Creatinine, Ser: 0.87 mg/dL (ref 0.44–1.00)
GFR calc Af Amer: >60 mL/min (ref >60)
GFR calc non Af Amer: 59 mL/min — ABNORMAL LOW (ref >60)
Glucose, Bld: 184 mg/dL — ABNORMAL HIGH (ref 65–99)
POTASSIUM: 3.8 mmol/L (ref 3.5–5.1)
Sodium: 139 mmol/L (ref 135–145)

## 2017-01-26 LAB — CBC
HEMATOCRIT: 37.6 % (ref 35.0–47.0)
HEMOGLOBIN: 13.3 g/dL (ref 12.0–16.0)
MCH: 31.8 pg (ref 26.0–34.0)
MCHC: 35.3 g/dL (ref 32.0–36.0)
MCV: 90.2 fL (ref 80.0–100.0)
Platelets: 256 10*3/uL (ref 150–440)
RBC: 4.16 MIL/uL (ref 3.80–5.20)
RDW: 13.4 % (ref 11.5–14.5)
WBC: 8.9 10*3/uL (ref 3.6–11.0)

## 2017-01-26 LAB — PROTIME-INR
INR: 1.05
PROTHROMBIN TIME: 13.7 s (ref 11.4–15.2)

## 2017-01-26 LAB — APTT: aPTT: 31 seconds (ref 24–36)

## 2017-01-26 LAB — TROPONIN I
TROPONIN I: 10.61 ng/mL — AB (ref <0.03)
Troponin I: 0.06 ng/mL (ref <0.03)
Troponin I: 0.55 ng/mL (ref <0.03)
Troponin I: 1.18 ng/mL (ref <0.03)
Troponin I: 6.83 ng/mL (ref <0.03)

## 2017-01-26 LAB — BRAIN NATRIURETIC PEPTIDE: B Natriuretic Peptide: 310 pg/mL — ABNORMAL HIGH (ref 0.0–100.0)

## 2017-01-26 LAB — TSH: TSH: 3.66 u[IU]/mL (ref 0.350–4.500)

## 2017-01-26 LAB — MRSA PCR SCREENING: MRSA by PCR: NEGATIVE

## 2017-01-26 MED ORDER — HEPARIN (PORCINE) IN NACL 100-0.45 UNIT/ML-% IJ SOLN
800.0000 [IU]/h | INTRAMUSCULAR | Status: DC
  Administered 2017-01-26 – 2017-01-27 (×2): 800 [IU]/h via INTRAVENOUS
  Filled 2017-01-26 (×2): qty 250

## 2017-01-26 MED ORDER — ASPIRIN 81 MG PO CHEW
81.0000 mg | CHEWABLE_TABLET | Freq: Every day | ORAL | Status: DC
  Administered 2017-01-27 – 2017-01-28 (×2): 81 mg via ORAL
  Filled 2017-01-26 (×2): qty 1

## 2017-01-26 MED ORDER — ONDANSETRON HCL 4 MG PO TABS: 4.0000 mg | ORAL_TABLET | Freq: Four times a day (QID) | ORAL | Status: DC | PRN

## 2017-01-26 MED ORDER — HEPARIN SODIUM (PORCINE) 5000 UNIT/ML IJ SOLN
5000.0000 [IU] | Freq: Three times a day (TID) | INTRAMUSCULAR | Status: DC
  Administered 2017-01-26: 5000 [IU] via SUBCUTANEOUS
  Filled 2017-01-26: qty 1

## 2017-01-26 MED ORDER — ONDANSETRON HCL 4 MG/2ML IJ SOLN: 4.0000 mg | Freq: Four times a day (QID) | INTRAMUSCULAR | Status: DC | PRN

## 2017-01-26 MED ORDER — SODIUM CHLORIDE 0.9 % IV SOLN
INTRAVENOUS | Status: DC
  Administered 2017-01-26 – 2017-01-27 (×3): via INTRAVENOUS

## 2017-01-26 MED ORDER — ATORVASTATIN CALCIUM 20 MG PO TABS
40.0000 mg | ORAL_TABLET | Freq: Every day | ORAL | Status: DC
  Administered 2017-01-26 – 2017-01-27 (×2): 40 mg via ORAL
  Filled 2017-01-26 (×2): qty 2

## 2017-01-26 MED ORDER — NITROGLYCERIN 0.4 MG SL SUBL: 0.4000 mg | SUBLINGUAL_TABLET | SUBLINGUAL | Status: DC | PRN

## 2017-01-26 MED ORDER — GUAIFENESIN 100 MG/5ML PO SYRP
100.0000 mg | ORAL_SOLUTION | ORAL | Status: DC | PRN
  Filled 2017-01-26: qty 5

## 2017-01-26 MED ORDER — HEPARIN (PORCINE) IN NACL 100-0.45 UNIT/ML-% IJ SOLN
800.0000 [IU]/h | INTRAMUSCULAR | Status: DC
Start: 2017-01-26 — End: 2017-01-26

## 2017-01-26 MED ORDER — FLUOXETINE HCL 10 MG PO CAPS
10.0000 mg | ORAL_CAPSULE | Freq: Every day | ORAL | Status: DC
  Administered 2017-01-27 – 2017-01-28 (×2): 10 mg via ORAL
  Filled 2017-01-26 (×3): qty 1

## 2017-01-26 MED ORDER — ISOSORBIDE MONONITRATE ER 30 MG PO TB24
30.0000 mg | ORAL_TABLET | Freq: Every day | ORAL | Status: DC
  Administered 2017-01-26 – 2017-01-28 (×3): 30 mg via ORAL
  Filled 2017-01-26 (×3): qty 1

## 2017-01-26 MED ORDER — CLOPIDOGREL BISULFATE 75 MG PO TABS
75.0000 mg | ORAL_TABLET | Freq: Every day | ORAL | Status: DC
  Administered 2017-01-26 – 2017-01-28 (×3): 75 mg via ORAL
  Filled 2017-01-26 (×3): qty 1

## 2017-01-26 MED ORDER — ACETAMINOPHEN 650 MG RE SUPP: 650.0000 mg | Freq: Four times a day (QID) | RECTAL | Status: DC | PRN

## 2017-01-26 MED ORDER — NITROGLYCERIN 0.4 MG/SPRAY TL SOLN
1.0000 | Status: DC | PRN
  Filled 2017-01-26: qty 4.9

## 2017-01-26 MED ORDER — DOCUSATE SODIUM 100 MG PO CAPS
100.0000 mg | ORAL_CAPSULE | Freq: Two times a day (BID) | ORAL | Status: DC
  Administered 2017-01-26 – 2017-01-28 (×4): 100 mg via ORAL
  Filled 2017-01-26 (×4): qty 1

## 2017-01-26 MED ORDER — METOCLOPRAMIDE HCL 5 MG PO TABS
5.0000 mg | ORAL_TABLET | Freq: Every evening | ORAL | Status: DC
  Administered 2017-01-26 – 2017-01-27 (×2): 5 mg via ORAL
  Filled 2017-01-26 (×2): qty 1

## 2017-01-26 MED ORDER — HYDROCHLOROTHIAZIDE 25 MG PO TABS
25.0000 mg | ORAL_TABLET | Freq: Every day | ORAL | Status: DC
  Administered 2017-01-26 – 2017-01-28 (×3): 25 mg via ORAL
  Filled 2017-01-26 (×3): qty 1

## 2017-01-26 MED ORDER — METOPROLOL TARTRATE 25 MG PO TABS
25.0000 mg | ORAL_TABLET | Freq: Two times a day (BID) | ORAL | Status: DC
  Administered 2017-01-26 – 2017-01-28 (×5): 25 mg via ORAL
  Filled 2017-01-26 (×5): qty 1

## 2017-01-26 MED ORDER — PANTOPRAZOLE SODIUM 40 MG PO TBEC
40.0000 mg | DELAYED_RELEASE_TABLET | Freq: Every day | ORAL | Status: DC
  Administered 2017-01-26 – 2017-01-28 (×3): 40 mg via ORAL
  Filled 2017-01-26 (×3): qty 1

## 2017-01-26 MED ORDER — HEPARIN BOLUS VIA INFUSION
4000.0000 [IU] | Freq: Once | INTRAVENOUS | Status: DC
  Filled 2017-01-26: qty 4000

## 2017-01-26 MED ORDER — IOPAMIDOL (ISOVUE-370) INJECTION 76%
100.0000 mL | Freq: Once | INTRAVENOUS | Status: AC | PRN
Start: 2017-01-26 — End: 2017-01-26
  Administered 2017-01-26: 100 mL via INTRAVENOUS

## 2017-01-26 MED ORDER — ADULT MULTIVITAMIN W/MINERALS CH
1.0000 | ORAL_TABLET | Freq: Every day | ORAL | Status: DC
  Administered 2017-01-26 – 2017-01-28 (×3): 1 via ORAL
  Filled 2017-01-26 (×3): qty 1

## 2017-01-26 MED ORDER — ENOXAPARIN SODIUM 40 MG/0.4ML ~~LOC~~ SOLN: 40.0000 mg | SUBCUTANEOUS | Status: DC

## 2017-01-26 MED ORDER — ACETAMINOPHEN 325 MG PO TABS: 650.0000 mg | ORAL_TABLET | Freq: Four times a day (QID) | ORAL | Status: DC | PRN

## 2017-01-26 NOTE — Progress Notes (Signed)
Patient resting in the bed at this time, denies any pain at this time, vss,  No c/o pain at this time

## 2017-01-26 NOTE — ED Notes (Signed)
Alexandria DameNancy Hanson, Daughter of pt left contact information [phone]

## 2017-01-26 NOTE — H&P (Signed)
Alexandria Hanson is an 83 y.o. female.   Chief Complaint: Chest pain HPI: This is an 83 year old female with past medical history significant for stroke with residual dysarthria, coronary artery disease status post MI 30 years ago and hypertension who presents to the emergency department from her nursing home with chest pain. The patient reports that she could feel her heart "going pitter patter". Now she complains of tightness around her chest as well as abdominal pain. It's unclear if the patient feels nauseous but she has not had any vomiting. Troponin levels in the emergency department were mildly elevated. The patient had relief of her chest pain with Nitropaste but then complained of vague nausea as well as upper epigastric pain. Her third troponin was increased yet again which prompted the hospitalist staff to start heparin drip and admit the patient to telemetry for further evaluation.  Past Medical History:  Diagnosis Date  . Anxiety   . Bleeding in brain [facility])    s/p fall  . CAD (coronary artery disease)    s/p MI 30 yrs ago  . Cerebral artery occlusion   . Depression   . Hypertension   . MI (mitral incompetence)   . Stroke ([place]) 2001  . Stroke [facility])     Past Surgical History:  Procedure Laterality Date  . ABDOMINAL HYSTERECTOMY    . APPENDECTOMY    . CHOLECYSTECTOMY    . COLON SURGERY     knot on colon removed    Family History  Problem Relation Age of Onset  . CVA Mother   . CAD Father   . CAD Sister   . Hyperlipidemia Mother   . Hypertension Mother   . Diabetes Sister   . Hypertension Sister    Social History:  reports that she has never smoked. She has never used smokeless tobacco. She reports that she does not drink alcohol or use drugs.  Allergies: No Known Allergies   (Not in a hospital admission)  Results for orders placed or performed during the hospital encounter of 01/26/17 (from the past 48 hour(s))  Basic metabolic panel     Status: Abnormal    Collection Time: 01/26/17  2:52 AM  Result Value Ref Range   Sodium 139 135 - 145 mmol/L   Potassium 3.8 3.5 - 5.1 mmol/L   Chloride 102 101 - 111 mmol/L   CO2 25 22 - 32 mmol/L   Glucose, Bld 184 (H) 65 - 99 mg/dL   BUN 15 6 - 20 mg/dL   Creatinine, Ser 0.87 0.44 - 1.00 mg/dL   Calcium 9.4 8.9 - 10.3 mg/dL   GFR calc non Af Amer 59 (L) >60 mL/min   GFR calc Af Amer >60 >60 mL/min    Comment: (NOTE) The eGFR has been calculated using the CKD EPI equation. This calculation has not been validated in all clinical situations. eGFR's persistently <60 mL/min signify possible Chronic Kidney Disease.    Anion gap 12 5 - 15  CBC     Status: None   Collection Time: 01/26/17  2:52 AM  Result Value Ref Range   WBC 8.9 3.6 - 11.0 K/uL   RBC 4.16 3.80 - 5.20 MIL/uL   Hemoglobin 13.3 12.0 - 16.0 g/dL   HCT 37.6 35.0 - 47.0 %   MCV 90.2 80.0 - 100.0 fL   MCH 31.8 26.0 - 34.0 pg   MCHC 35.3 32.0 - 36.0 g/dL   RDW 13.4 11.5 - 14.5 %   Platelets 256 150 -  440 K/uL  Troponin I     Status: Abnormal   Collection Time: 01/26/17  2:52 AM  Result Value Ref Range   Troponin I 0.06 (HH) <0.03 ng/mL    Comment: CRITICAL RESULT CALLED TO, READ BACK BY AND VERIFIED WITH KAILEY WALKER AT 0327 01/26/17.PMH  Hepatic function panel     Status: Abnormal   Collection Time: 01/26/17  2:52 AM  Result Value Ref Range   Total Protein 7.4 6.5 - 8.1 g/dL   Albumin 4.2 3.5 - 5.0 g/dL   AST 54 (H) 15 - 41 U/L   ALT 32 14 - 54 U/L   Alkaline Phosphatase 72 38 - 126 U/L   Total Bilirubin 1.5 (H) 0.3 - 1.2 mg/dL   Bilirubin, Direct 0.3 0.1 - 0.5 mg/dL   Indirect Bilirubin 1.2 (H) 0.3 - 0.9 mg/dL  Brain natriuretic peptide     Status: Abnormal   Collection Time: 01/26/17  2:52 AM  Result Value Ref Range   B Natriuretic Peptide 310.0 (H) 0.0 - 100.0 pg/mL  Troponin I     Status: Abnormal   Collection Time: 01/26/17  5:53 AM  Result Value Ref Range   Troponin I 0.55 (HH) <0.03 ng/mL    Comment: CRITICAL RESULT  CALLED TO, READ BACK BY AND VERIFIED WITH KAILEY WALKER AT 3428 01/26/17.PMH   Dg Chest Portable 1 View  Result Date: 01/26/2017 CLINICAL DATA:  83 year old female with acute onset of chest pain. EXAM: PORTABLE CHEST 1 VIEW COMPARISON:  None. FINDINGS: There are minimal bibasilar atelectatic changes. There is diffuse interstitial coarsening. No focal consolidation, pleural effusion, or pneumothorax. The cardiac silhouette is within normal limits. No acute osseous pathology. IMPRESSION: No active disease. Electronically Signed   By: Anner Crete M.D.   On: 01/26/2017 03:34   Ct Angio Chest/abd/pel For Dissection W And/or W/wo  Result Date: 01/26/2017 CLINICAL DATA:  Sudden onset left-sided chest pain.  Hypertension. EXAM: CT ANGIOGRAPHY CHEST, ABDOMEN AND PELVIS TECHNIQUE: Multidetector CT imaging through the chest, abdomen and pelvis was performed using the standard protocol during bolus administration of intravenous contrast. Multiplanar reconstructed images and MIPs were obtained and reviewed to evaluate the vascular anatomy. CONTRAST:  100 mL Isovue 370 COMPARISON:  None. FINDINGS: CTA CHEST FINDINGS Cardiovascular: Unenhanced images of the chest demonstrate calcifications in the aorta and branch vessels. Coronary artery calcifications. No evidence of intramural hematoma. Normal caliber thoracic aorta. No evidence of aortic dissection. Great vessel origins are patent. Central pulmonary arteries are well opacified without evidence of significant pulmonary embolus. Normal heart size. No pericardial effusion. Mediastinum/Nodes: No enlarged mediastinal, hilar, or axillary lymph nodes. Thyroid gland, trachea, and esophagus demonstrate no significant findings. Lungs/Pleura: Evaluation of the lungs is limited due to respiratory motion artifact. There is evidence of interstitial and airspace disease in both lower lungs possibly representing edema or multifocal pneumonia. Minimal bilateral pleural effusions. No  focal consolidation. No pneumothorax. Airways are patent. Musculoskeletal: No chest wall abnormality. No acute or significant osseous findings. Review of the MIP images confirms the above findings. CTA ABDOMEN AND PELVIS FINDINGS VASCULAR Aorta: Aortic calcification.  No aneurysm or dissection. Celiac: Calcification at the origin of the celiac artery and in the splenic artery. Vessels remain patent. SMA: Calcification of the superior mesenteric artery. Vessel remains patent. Renals: Duplicated bilateral renal arteries with calcification. Vessels remain patent. IMA: Inferior mesenteric artery is patent. Inflow: Inflow vessels are patent with diffuse calcification. Veins: No obvious venous abnormality within the limitations of this arterial phase  study. Review of the MIP images confirms the above findings. NON-VASCULAR Hepatobiliary: Diffuse fatty infiltration of the liver. No focal lesions. Gallbladder is surgically absent. No bile duct dilatation. Pancreas: Unremarkable. No pancreatic ductal dilatation or surrounding inflammatory changes. Spleen: Normal in size without focal abnormality. Adrenals/Urinary Tract: Adrenal glands are unremarkable. Kidneys are normal, without renal calculi, focal lesion, or hydronephrosis. Bladder is unremarkable. Stomach/Bowel: Stomach, small bowel, and colon are not abnormally distended. No wall thickening is appreciated. Stool throughout the colon. Appendix is surgically absent. Lymphatic: No significant lymphadenopathy. Reproductive: Status post hysterectomy. No adnexal masses. Other: No abdominal wall hernia or abnormality. No abdominopelvic ascites. Musculoskeletal: Degenerative changes in the spine. No destructive bone lesions. Review of the MIP images confirms the above findings. IMPRESSION: 1. No evidence of aneurysm or dissection of the thoracic or abdominal aorta. 2. Diffuse aortic and branch vessel atherosclerosis with diffuse calcification of the vessels. No vascular  occlusion noted. 3. Diffuse fatty infiltration of the liver. 4. Interstitial and airspace infiltrates in the lung bases likely represent edema or multifocal pneumonia. Electronically Signed   By: Lucienne Capers M.D.   On: 01/26/2017 04:31    Review of Systems  Constitutional: Negative for chills and fever.  HENT: Negative for sore throat and tinnitus.   Eyes: Negative for blurred vision and redness.  Respiratory: Negative for cough and shortness of breath.   Cardiovascular: Positive for palpitations. Negative for chest pain, orthopnea and PND.  Gastrointestinal: Positive for abdominal pain. Negative for diarrhea, nausea and vomiting.  Genitourinary: Negative for dysuria, frequency and urgency.  Musculoskeletal: Negative for joint pain and myalgias.  Skin: Negative for rash.       No lesions  Neurological: Negative for speech change, focal weakness and weakness.  Endo/Heme/Allergies: Does not bruise/bleed easily.       No temperature intolerance  Psychiatric/Behavioral: Negative for depression and suicidal ideas.    Blood pressure 133/79, pulse (!) 112, temperature 98 F (36.7 C), temperature source Oral, resp. rate 18, height 5' 7"  (1.702 m), weight 65.8 kg (145 lb), SpO2 95 %. Physical Exam  Vitals reviewed. Constitutional: She is oriented to person, place, and time. She appears well-developed and well-nourished. No distress.  HENT:  Head: Normocephalic and atraumatic.  Mouth/Throat: Oropharynx is clear and moist.  Eyes: Pupils are equal, round, and reactive to light. Conjunctivae and EOM are normal. No scleral icterus.  Neck: Normal range of motion. Neck supple. No JVD present. No tracheal deviation present. No thyromegaly present.  Cardiovascular: Normal rate, regular rhythm and normal heart sounds.  Exam reveals no gallop and no friction rub.   No murmur heard. Respiratory: Effort normal and breath sounds normal.  GI: Soft. Bowel sounds are normal. She exhibits no distension.  There is no tenderness.  Genitourinary:  Genitourinary Comments: Deferred  Musculoskeletal: Normal range of motion. She exhibits no edema.  Lymphadenopathy:    She has no cervical adenopathy.  Neurological: She is alert and oriented to person, place, and time. No cranial nerve deficit. She exhibits normal muscle tone.  Skin: Skin is warm and dry. No rash noted. No erythema.  Psychiatric: She has a normal mood and affect. Her behavior is normal. Judgment and thought content normal.     Assessment/Plan This is an 83 year old female admitted for NSTEMI. 1. NSTEMI: Continue to follow troponins. Monitor telemetry. Heparin drip. Cardiology has been consulted. Control blood pressure. 2. Coronary artery disease: Continue aspirin and Plavix. Imdur per home regimen. Nitroglycerin as needed 3. Hypertension: Controlled; continue hydrochlorothiazide.  Labetalol as needed. 4. DVT prophylaxis: Therapeutic anticoagulation 5. GI prophylaxis: Reglan and Protonix per home regimen The patient is a full code. Time spent on admission orders and patient care approximately 45 minutes  Harrie Foreman, MD 01/26/2017, 6:53 AM

## 2017-01-26 NOTE — ED Triage Notes (Signed)
[facility] Pt had a sudden onset of left sided chest pain that began at 0130. Pt was given 3 spray of nitroglycerin by facility, 324 of aspirin, and ems applied 1 " of NTG paste. EMS reports blood press of 150/100, HR 122, and o2 of 98%.

## 2017-01-26 NOTE — Progress Notes (Signed)
ANTICOAGULATION CONSULT NOTE - Initial Consult  Pharmacy Consult for Heparin Drip Indication: chest pain/ACS/elevated troponin  No Known Allergies  Patient Measurements: Height: 5\' 7"  (170.2 cm) Weight: 145 lb (65.8 kg) IBW/kg (Calculated) : 61.6 Heparin Dosing Weight: 65.8  Vital Signs: BP: 134/77 (07/30 0751) Pulse Rate: 108 (07/30 0751)  Labs:  Recent Labs  01/26/17 0252 01/26/17 0553 01/26/17 0749 01/26/17 1351  HGB 13.3  --   --   --   HCT 37.6  --   --   --   PLT 256  --   --   --   APTT  --   --  31  --   LABPROT  --   --  13.7  --   INR  --   --  1.05  --   CREATININE 0.87  --   --   --   TROPONINI 0.06* 0.55* 1.18* 6.83*    Estimated Creatinine Clearance: 45.1 mL/min (by C-G formula based on SCr of 0.87 mg/dL).   Medical History: Past Medical History:  Diagnosis Date  . Anxiety   . Bleeding in brain [facility](HCC)    s/p fall  . CAD (coronary artery disease)    s/p MI 30 yrs ago  . Cerebral artery occlusion   . Depression   . Hypertension   . MI (mitral incompetence)   . Stroke (HCC) 2001  . Stroke [facility](HCC)     Medications:  Scheduled:  . aspirin  81 mg Oral Daily  . atorvastatin  40 mg Oral q1800  . clopidogrel  75 mg Oral Daily  . docusate sodium  100 mg Oral BID  . FLUoxetine  10 mg Oral Daily  . heparin subcutaneous  5,000 Units Subcutaneous Q8H  . hydrochlorothiazide  25 mg Oral Daily  . isosorbide mononitrate  30 mg Oral Daily  . metoCLOPramide  5 mg Oral QPM  . metoprolol tartrate  25 mg Oral BID  . multivitamin with minerals  1 tablet Oral Daily  . pantoprazole  40 mg Oral Daily   Infusions:  . sodium chloride 100 mL/hr at 01/26/17 1036    Assessment: 83 yo F with possible NSTEMI, elevated troponin. On ASA/Plavix PTA. Hgb 13.3  Plt 256   INR pending,  aPTT pending  Goal of Therapy:  Heparin level 0.3-0.7 units/ml Monitor platelets by anticoagulation protocol: Yes   Plan:  No bolus- pt received a SubQ dose of heparin 1 hr  ago Start drip at 800 units/hr Check HL in 8 hours CBC daily  Kyrian Stage D Dreamer Carillo, Pharm.D, BCPS Clinical Pharmacist  01/26/2017,3:00 PM

## 2017-01-26 NOTE — Progress Notes (Signed)
ANTICOAGULATION CONSULT NOTE - Initial Consult  Pharmacy Consult for Heparin Drip Indication: chest pain/ACS/elevated troponin  No Known Allergies  Patient Measurements: Height: 5\' 7"  (170.2 cm) Weight: 145 lb (65.8 kg) IBW/kg (Calculated) : 61.6 Heparin Dosing Weight: 65.8  Vital Signs: Temp: 98 F (36.7 C) (07/30 0256) Temp Source: Oral (07/30 0256) BP: 134/77 (07/30 0751) Pulse Rate: 108 (07/30 0751)  Labs:  Recent Labs  01/26/17 0252 01/26/17 0553  HGB 13.3  --   HCT 37.6  --   PLT 256  --   CREATININE 0.87  --   TROPONINI 0.06* 0.55*    Estimated Creatinine Clearance: 45.1 mL/min (by C-G formula based on SCr of 0.87 mg/dL).   Medical History: Past Medical History:  Diagnosis Date  . Anxiety   . Bleeding in brain [facility](HCC)    s/p fall  . CAD (coronary artery disease)    s/p MI 30 yrs ago  . Cerebral artery occlusion   . Depression   . Hypertension   . MI (mitral incompetence)   . Stroke (HCC) 2001  . Stroke [facility](HCC)     Medications:  Scheduled:  . aspirin  81 mg Oral Daily  . atorvastatin  40 mg Oral q1800  . clopidogrel  75 mg Oral Daily  . docusate sodium  100 mg Oral BID  . FLUoxetine  10 mg Oral Daily  . heparin  4,000 Units Intravenous Once  . hydrochlorothiazide  25 mg Oral Daily  . isosorbide mononitrate  30 mg Oral Daily  . metoCLOPramide  5 mg Oral QPM  . multivitamin with minerals  1 tablet Oral Daily  . pantoprazole  40 mg Oral Daily   Infusions:  . sodium chloride    . heparin      Assessment: 83 yo F with possible NSTEMI, elevated troponin. On ASA/Plavix PTA. Hgb 13.3  Plt 256   INR pending,  aPTT pending  Goal of Therapy:  Heparin level 0.3-0.7 units/ml Monitor platelets by anticoagulation protocol: Yes   Plan:  Give 4000 units bolus x 1 Start heparin infusion at 800 units/hr Check anti-Xa level in 8 hours and daily while on heparin Continue to monitor H&H and platelets  Alexandria Hanson A 01/26/2017,8:01 AM

## 2017-01-26 NOTE — Progress Notes (Signed)
[employer] [facility] [place] [facility]   PATIENT NAME: Alexandria Hanson    MR#:  [id number]  DATE OF BIRTH:  October 09, 1928  SUBJECTIVE:   Patient here with atypical chest pain and noted to have a mildly elevated troponin. Currently not having any chest pains. Hemodynamically stable. Seen by cardiology and no plans for acute intervention presently.  REVIEW OF SYSTEMS:    Review of Systems  Constitutional: Negative for chills and fever.  HENT: Negative for congestion and tinnitus.   Eyes: Negative for blurred vision and double vision.  Respiratory: Negative for cough, shortness of breath and wheezing.   Cardiovascular: Negative for chest pain, orthopnea and PND.  Gastrointestinal: Negative for abdominal pain, diarrhea, nausea and vomiting.  Genitourinary: Negative for dysuria and hematuria.  Neurological: Negative for dizziness, sensory change and focal weakness.  All other systems reviewed and are negative.   Nutrition: heart healthy Tolerating Diet: Yes Tolerating PT: Await Eval.   DRUG ALLERGIES:  No Known Allergies  VITALS:  Blood pressure 134/77, pulse (!) 108, temperature 98 F (36.7 C), temperature source Oral, resp. rate 18, height 5\' 7"  (1.702 m), weight 65.8 kg (145 lb), SpO2 97 %.  PHYSICAL EXAMINATION:   Physical Exam  GENERAL:  83 y.o.-year-old patient lying in bed in no acute distress.  EYES: Pupils equal, round, reactive to light and accommodation. No scleral icterus. Extraocular muscles intact.  HEENT: Head atraumatic, normocephalic. Oropharynx and nasopharynx clear.  NECK:  Supple, no jugular venous distention. No thyroid enlargement, no tenderness.  LUNGS: Normal breath sounds bilaterally, no wheezing, rales, rhonchi. No use of accessory muscles of respiration.  CARDIOVASCULAR: S1, S2 normal. No murmurs, rubs, or gallops.  ABDOMEN: Soft, nontender, nondistended. Bowel sounds present. No organomegaly or mass.  EXTREMITIES: No cyanosis, clubbing or  edema b/l.    NEUROLOGIC: Cranial nerves II through XII are intact. No focal Motor or sensory deficits b/l.  Positive dysarthria from previous CVA. PSYCHIATRIC: The patient is alert and oriented x 3.  SKIN: No obvious rash, lesion, or ulcer.    LABORATORY PANEL:   CBC  Recent Labs Lab 01/26/17 0252  WBC 8.9  HGB 13.3  HCT 37.6  PLT 256   ------------------------------------------------------------------------------------------------------------------  Chemistries   Recent Labs Lab 01/26/17 0252  NA 139  K 3.8  CL 102  CO2 25  GLUCOSE 184*  BUN 15  CREATININE 0.87  CALCIUM 9.4  AST 54*  ALT 32  ALKPHOS 72  BILITOT 1.5*   ------------------------------------------------------------------------------------------------------------------  Cardiac Enzymes  Recent Labs Lab 01/26/17 1351  TROPONINI 6.83*   ------------------------------------------------------------------------------------------------------------------  RADIOLOGY:  Dg Chest Portable 1 View  Result Date: 01/26/2017 CLINICAL DATA:  83 year old female with acute onset of chest pain. EXAM: PORTABLE CHEST 1 VIEW COMPARISON:  None. FINDINGS: There are minimal bibasilar atelectatic changes. There is diffuse interstitial coarsening. No focal consolidation, pleural effusion, or pneumothorax. The cardiac silhouette is within normal limits. No acute osseous pathology. IMPRESSION: No active disease. Electronically Signed   By: Elgie CollardArash  Radparvar M.D.   On: 01/26/2017 03:34   Ct Angio Chest/abd/pel For Dissection W And/or W/wo  Result Date: 01/26/2017 CLINICAL DATA:  Sudden onset left-sided chest pain.  Hypertension. EXAM: CT ANGIOGRAPHY CHEST, ABDOMEN AND PELVIS TECHNIQUE: Multidetector CT imaging through the chest, abdomen and pelvis was performed using the standard protocol during bolus administration of intravenous contrast. Multiplanar reconstructed images and MIPs were obtained and reviewed to evaluate the  vascular anatomy. CONTRAST:  100 mL Isovue 370 COMPARISON:  None. FINDINGS:  CTA CHEST FINDINGS Cardiovascular: Unenhanced images of the chest demonstrate calcifications in the aorta and branch vessels. Coronary artery calcifications. No evidence of intramural hematoma. Normal caliber thoracic aorta. No evidence of aortic dissection. Great vessel origins are patent. Central pulmonary arteries are well opacified without evidence of significant pulmonary embolus. Normal heart size. No pericardial effusion. Mediastinum/Nodes: No enlarged mediastinal, hilar, or axillary lymph nodes. Thyroid gland, trachea, and esophagus demonstrate no significant findings. Lungs/Pleura: Evaluation of the lungs is limited due to respiratory motion artifact. There is evidence of interstitial and airspace disease in both lower lungs possibly representing edema or multifocal pneumonia. Minimal bilateral pleural effusions. No focal consolidation. No pneumothorax. Airways are patent. Musculoskeletal: No chest wall abnormality. No acute or significant osseous findings. Review of the MIP images confirms the above findings. CTA ABDOMEN AND PELVIS FINDINGS VASCULAR Aorta: Aortic calcification.  No aneurysm or dissection. Celiac: Calcification at the origin of the celiac artery and in the splenic artery. Vessels remain patent. SMA: Calcification of the superior mesenteric artery. Vessel remains patent. Renals: Duplicated bilateral renal arteries with calcification. Vessels remain patent. IMA: Inferior mesenteric artery is patent. Inflow: Inflow vessels are patent with diffuse calcification. Veins: No obvious venous abnormality within the limitations of this arterial phase study. Review of the MIP images confirms the above findings. NON-VASCULAR Hepatobiliary: Diffuse fatty infiltration of the liver. No focal lesions. Gallbladder is surgically absent. No bile duct dilatation. Pancreas: Unremarkable. No pancreatic ductal dilatation or surrounding  inflammatory changes. Spleen: Normal in size without focal abnormality. Adrenals/Urinary Tract: Adrenal glands are unremarkable. Kidneys are normal, without renal calculi, focal lesion, or hydronephrosis. Bladder is unremarkable. Stomach/Bowel: Stomach, small bowel, and colon are not abnormally distended. No wall thickening is appreciated. Stool throughout the colon. Appendix is surgically absent. Lymphatic: No significant lymphadenopathy. Reproductive: Status post hysterectomy. No adnexal masses. Other: No abdominal wall hernia or abnormality. No abdominopelvic ascites. Musculoskeletal: Degenerative changes in the spine. No destructive bone lesions. Review of the MIP images confirms the above findings. IMPRESSION: 1. No evidence of aneurysm or dissection of the thoracic or abdominal aorta. 2. Diffuse aortic and branch vessel atherosclerosis with diffuse calcification of the vessels. No vascular occlusion noted. 3. Diffuse fatty infiltration of the liver. 4. Interstitial and airspace infiltrates in the lung bases likely represent edema or multifocal pneumonia. Electronically Signed   By: Burman Nieves M.D.   On: 01/26/2017 04:31     ASSESSMENT AND PLAN:   83 year old female with past medical history of previous CVA, hypertension, history of coronary artery disease, previous history of MI who presented to the hospital with chest pain.  1. Non-ST elevation MI-patient has ruled in by cardiac markers given her troponin elevation and has peaked as high as 6. -Patient presently denies any chest pain, shortness of breath. -Seen by cardiology and no plans for acute intervention presently. -Continue aspirin, Plavix, atorvastatin, Imdur, heparin drip, RRR. -Appreciate cardiology input, await echocardiogram results.   2. GERD-continue Protonix.  3. Essential hypertension-continue Imdur, metoprolol, hydrocodone 5*.  4. Depression-continue Prozac.  5. History of previous CVA-continue aspirin, Plavix,  statin.     All the records are reviewed and case discussed with Care Management/Social Worker. Management plans discussed with the patient, family and they are in agreement.  CODE STATUS: DO NOT RESUSCITATE  DVT Prophylaxis: Heparin drip  TOTAL TIME TAKING CARE OF THIS PATIENT: 30 minutes.   POSSIBLE D/C IN 1-2 DAYS, DEPENDING ON CLINICAL CONDITION.   Houston Siren M.D on 01/26/2017 at 3:30 PM  Between  7am to 6pm - Pager - [phone]  After 6pm go to [email or web address] - Scientist, research (life sciences)password [email or web address]  [facility] [place] [facility]  Office  [phone]  CC: Primary care physician; Gabriel CirriWicker, Cheryl, NP

## 2017-01-26 NOTE — Progress Notes (Signed)
Troponin at 6.83.  No chest pain.    Dr. Darrold JunkerParaschos aware.  No new orders.

## 2017-01-26 NOTE — Care Management (Addendum)
Screen due to age and diagnosis.  Patient presented from [facility] where is is under a long term care plan of treatment. History of cva and dysarthric.  Admitted with nstemi.  Cardiology consulting and recommending conservative management.  Referral to csw.  Patient is DNR

## 2017-01-26 NOTE — ED Provider Notes (Signed)
[facility] Emergency Department Provider Note  ____________________________________________   First MD Initiated Contact with Patient 01/26/17 0255     (approximate)  I have reviewed the triage vital signs and the nursing notes.   HISTORY  Chief Complaint Chest Pain  Level V exemption history is limited by the patient's previous stroke and difficulty communicating  HPI Alexandria Hanson is a 83 y.o. female presents to the emergency department via EMS with roughly 1 hour of severe left-sided chest pain. She resides at an assisted living facility and they gave her 3 sprays of nitroglycerin along with Phenergan for her nausea. When EMS arrived they noted she was diaphoretic and hypertensive and tachycardic. They gave her 324 mg of aspirin at what age of nitroglycerin paste and transported her to the emergency department. The patient has a history of a recent stroke as well as remote history of myocardial infarction. Further history is challenging to obtain.   Past Medical History:  Diagnosis Date  . Anxiety   . Bleeding in brain [facility])    s/p fall  . CAD (coronary artery disease)    s/p MI 30 yrs ago  . Cerebral artery occlusion   . Depression   . Hypertension   . MI (mitral incompetence)   . Stroke (HCC) 2001  . Stroke [facility])     Patient Active Problem List   Diagnosis Date Noted  . Chest pain 01/26/2017  . NSTEMI (non-ST elevated myocardial infarction) (HCC) 01/26/2017  . Stroke (HCC) 03/08/2016  . Depression   . Anxiety   . Hypertension     Past Surgical History:  Procedure Laterality Date  . ABDOMINAL HYSTERECTOMY    . APPENDECTOMY    . CHOLECYSTECTOMY    . COLON SURGERY     knot on colon removed    Prior to Admission medications   Medication Sig Start Date End Date Taking? Authorizing Provider  aspirin 81 MG chewable tablet Chew 81 mg by mouth daily.   Yes [provider]  atorvastatin (LIPITOR) 40 MG tablet Take 1 tablet (40  mg total) by mouth daily at 6 PM. 03/11/16  Yes Auburn Bilberry, MD  clopidogrel (PLAVIX) 75 MG tablet Take 1 tablet (75 mg total) by mouth daily. 03/11/16  Yes Auburn Bilberry, MD  FLUoxetine (PROZAC) 10 MG capsule Take 10 mg by mouth daily.   Yes [provider]  guaifenesin (ROBITUSSIN) 100 MG/5ML syrup Take 100 mg by mouth every 4 (four) hours as needed for cough.   Yes [provider]  hydrochlorothiazide (HYDRODIURIL) 25 MG tablet Take 1 tablet (25 mg total) by mouth daily. 01/18/16  Yes Gabriel Cirri, NP  isosorbide mononitrate (IMDUR) 30 MG 24 hr tablet Take 30 mg by mouth daily.   Yes [provider]  metoCLOPramide (REGLAN) 5 MG tablet Take 5 mg by mouth every evening.   Yes [provider]  Multiple Vitamin (MULTIVITAMIN) tablet Take 1 tablet by mouth daily.   Yes [provider]  nitroGLYCERIN (NITROLINGUAL) 0.4 MG/SPRAY spray Place 1 spray under the tongue every 5 (five) minutes x 3 doses as needed for chest pain.   Yes [provider]  pantoprazole (PROTONIX) 40 MG tablet Take 40 mg by mouth daily.   Yes [provider]    Allergies Patient has no known allergies.  Family History  Problem Relation Age of Onset  . CVA Mother   . CAD Father   . CAD Sister   . Hyperlipidemia Mother   .  Hypertension Mother   . Diabetes Sister   . Hypertension Sister     Social History Social History  Substance Use Topics  . Smoking status: Never Smoker  . Smokeless tobacco: Never Used  . Alcohol use No    Review of Systems Level V exemption history Limited by the patient's clinical condition ____________________________________________   PHYSICAL EXAM:  VITAL SIGNS: ED Triage Vitals  Enc Vitals Group     BP --      Pulse --      Resp --      Temp --      Temp src --      SpO2 --      Weight 01/26/17 0253 145 lb (65.8 kg)     Height 01/26/17 0253 5\' 7"  (1.702 m)     Head Circumference --      Peak Flow --       Pain Score 01/26/17 0252 10     Pain Loc --      Pain Edu? --      Excl. in GC? --     Constitutional: Appears uncomfortable mild diaphoresis increased work of breathing slurred speech Eyes: PERRL EOMI. Head: Atraumatic. Nose: No congestion/rhinnorhea. Mouth/Throat: No trismus Neck: No stridor.   Cardiovascular: Tachycardic rate, regular rhythm. Grossly normal heart sounds.  Good peripheral circulation. Respiratory: Increased respiratory effort.  No retractions. Lungs CTAB and moving good air Gastrointestinal: Soft nontender Musculoskeletal: No lower extremity edema   Neurologic:  Slurred garbled speech with a right-sided deficits Skin:  Slight diaphoresis     ____________________________________________   DIFFERENTIAL includes but not limited to  Acute coronary syndrome, aortic dissection, pulmonary embolism, Boerhaave syndrome, pneumothorax ____________________________________________   LABS (all labs ordered are listed, but only abnormal results are displayed)  Labs Reviewed  BASIC METABOLIC PANEL - Abnormal; Notable for the following:       Result Value   Glucose, Bld 184 (*)    GFR calc non Af Amer 59 (*)    All other components within normal limits  TROPONIN I - Abnormal; Notable for the following:    Troponin I 0.06 (*)    All other components within normal limits  HEPATIC FUNCTION PANEL - Abnormal; Notable for the following:    AST 54 (*)    Total Bilirubin 1.5 (*)    Indirect Bilirubin 1.2 (*)    All other components within normal limits  BRAIN NATRIURETIC PEPTIDE - Abnormal; Notable for the following:    B Natriuretic Peptide 310.0 (*)    All other components within normal limits  TROPONIN I - Abnormal; Notable for the following:    Troponin I 0.55 (*)    All other components within normal limits  CBC  APTT  PROTIME-INR    Elevated troponin with normal renal function concerning for primary coronary  ischemia __________________________________________  EKG  ED ECG REPORT I, Merrily BrittleNeil Chibuikem Thang, the attending physician, personally viewed and interpreted this ECG.  Date: 01/26/2017 Rate: 109 Rhythm: Sinus tachycardia QRS Axis: normal Intervals: normal ST/T Wave abnormalities: normal Narrative Interpretation: Bifascicular block with repolarization abnormalities no ST elevation abnormal EKG  ____________________________________________  RADIOLOGY  Chest x-ray interpreted by me slightly widened mediastinum ____________________________________________   PROCEDURES  Procedure(s) performed: no  Procedures  Critical Care performed: yes  CRITICAL CARE Performed by: Merrily BrittleNeil Griselda Tosh   Total critical care time: 35 minutes  Critical care time was exclusive of separately billable procedures and treating other patients.  Critical care was necessary to  treat or prevent imminent or life-threatening deterioration.  Critical care was time spent personally by me on the following activities: development of treatment plan with patient and/or surrogate as well as nursing, discussions with consultants, evaluation of patient's response to treatment, examination of patient, obtaining history from patient or surrogate, ordering and performing treatments and interventions, ordering and review of laboratory studies, ordering and review of radiographic studies, pulse oximetry and re-evaluation of patient's condition.   Observation: no ____________________________________________   INITIAL IMPRESSION / ASSESSMENT AND PLAN / ED COURSE  Pertinent labs & imaging results that were available during my care of the patient were reviewed by me and considered in my medical decision making (see chart for details).  On arrival the patient is tachycardic diaphoretic and uncomfortable appearing. History is extremely difficult to obtain as she is nearly nonverbal following her stroke.      ----------------------------------------- 3:46 AM on 01/26/2017 -----------------------------------------  The patient's pain is somewhat improved however she is still tachycardic and hypertensive. Her chest x-ray shows a somewhat enlarged mediastinum and given her sudden onset chest pain along with hypertension and concerned that she may actually have an aortic dissection. She will require CT scan of her chest prior to initiation of any heparin. If the CT is negative we will treat her as a non-ST elevation myocardial infarction and she will require inpatient admission. According to family the patient has a history of MI roughly 30 years ago and she does not currently have a cardiologist. ____________________________________________  Fortunately the patient's CT scan of her chest is negative for aortic dissection. Her elevated troponin with normal renal function however is concerning for non-ST elevation myocardial infarction and she requires inpatient admission for full evaluation and likely provocative testing versus cardiac catheterization.   FINAL CLINICAL IMPRESSION(S) / ED DIAGNOSES  Final diagnoses:  NSTEMI (non-ST elevated myocardial infarction) (HCC)      NEW MEDICATIONS STARTED DURING THIS VISIT:  New Prescriptions   No medications on file     Note:  This document was prepared using Dragon voice recognition software and may include unintentional dictation errors.     Merrily Brittleifenbark, Hajer Dwyer, MD 01/26/17 [phone]

## 2017-01-26 NOTE — Consult Note (Signed)
[facility]  CARDIOLOGY CONSULT NOTE  Patient ID: Alexandria Hanson MRN: [id number] DOB/AGE: Sep 24, 1928 83 y.o.  Admit date: 01/26/2017 [facility]  Primary Cardiologist  Reason for Consultation Chest pain  HPI: 83 year old female referred for evaluation of chest pain and elevated troponin. The patient is a resident at a skilled nursing facility, [facility] to [facility] emergency room for chest discomfort. The patient is dysarthric with prior history of CVA with difficulty communicating her symptoms. She complains of a tapping chest pain, in the mid epigastric area of unknown duration. Initial ECG revealed sinus rhythm with a right bundle branch block. Initial troponin was borderline elevated at 0.06, follow-up 0.55.  Review of systems complete and found to be negative unless listed above     Past Medical History:  Diagnosis Date  . Anxiety   . Bleeding in brain [facility](HCC)    s/p fall  . CAD (coronary artery disease)    s/p MI 30 yrs ago  . Cerebral artery occlusion   . Depression   . Hypertension   . MI (mitral incompetence)   . Stroke (HCC) 2001  . Stroke [place](HCC)     Past Surgical History:  Procedure Laterality Date  . ABDOMINAL HYSTERECTOMY    . APPENDECTOMY    . CHOLECYSTECTOMY    . COLON SURGERY     knot on colon removed    Prescriptions Prior to Admission  Medication Sig Dispense Refill Last Dose  . aspirin 81 MG chewable tablet Chew 81 mg by mouth daily.   01/25/2017 at Unknown time  . atorvastatin (LIPITOR) 40 MG tablet Take 1 tablet (40 mg total) by mouth daily at 6 PM.   01/25/2017 at Unknown time  . clopidogrel (PLAVIX) 75 MG tablet Take 1 tablet (75 mg total) by mouth daily.   01/25/2017 at Unknown time  . FLUoxetine (PROZAC) 10 MG capsule Take 10 mg by mouth daily.   01/25/2017 at Unknown time  . guaifenesin (ROBITUSSIN) 100 MG/5ML syrup Take 100 mg by mouth every 4 (four) hours as needed for cough.   prn at prn  . hydrochlorothiazide  (HYDRODIURIL) 25 MG tablet Take 1 tablet (25 mg total) by mouth daily. 30 tablet 1 01/25/2017 at Unknown time  . isosorbide mononitrate (IMDUR) 30 MG 24 hr tablet Take 30 mg by mouth daily.   01/25/2017 at Unknown time  . metoCLOPramide (REGLAN) 5 MG tablet Take 5 mg by mouth every evening.   01/25/2017 at Unknown time  . Multiple Vitamin (MULTIVITAMIN) tablet Take 1 tablet by mouth daily.   01/25/2017 at Unknown time  . nitroGLYCERIN (NITROLINGUAL) 0.4 MG/SPRAY spray Place 1 spray under the tongue every 5 (five) minutes x 3 doses as needed for chest pain.   01/26/2017 at Unknown time  . pantoprazole (PROTONIX) 40 MG tablet Take 40 mg by mouth daily.   01/25/2017 at Unknown time   Social History   Social History  . Marital status: Widowed    Spouse name: N/A  . Number of children: N/A  . Years of education: N/A   Occupational History  . Not on file.   Social History Main Topics  . Smoking status: Never Smoker  . Smokeless tobacco: Never Used  . Alcohol use No  . Drug use: No  . Sexual activity: Not on file   Other Topics Concern  . Not on file   Social History Narrative   ** Merged History Encounter **        Family History  Problem Relation Age of Onset  . CVA Mother   . CAD Father   . CAD Sister   . Hyperlipidemia Mother   . Hypertension Mother   . Diabetes Sister   . Hypertension Sister       Review of systems complete and found to be negative unless listed above      PHYSICAL EXAM  General: Well developed, well nourished, in no acute distress HEENT:  Normocephalic and atramatic Neck:  No JVD.  Lungs: Clear bilaterally to auscultation and percussion. Heart: HRRR . Normal S1 and S2 without gallops or murmurs.  Abdomen: Bowel sounds are positive, abdomen soft and non-tender  Msk:  Back normal, normal gait. Normal strength and tone for age. Extremities: No clubbing, cyanosis or edema.   Neuro: Alert and oriented X 3. Psych:  Good affect, responds  appropriately  Labs:   Lab Results  Component Value Date   WBC 8.9 01/26/2017   HGB 13.3 01/26/2017   HCT 37.6 01/26/2017   MCV 90.2 01/26/2017   PLT 256 01/26/2017    Recent Labs Lab 01/26/17 0252  NA 139  K 3.8  CL 102  CO2 25  BUN 15  CREATININE 0.87  CALCIUM 9.4  PROT 7.4  BILITOT 1.5*  ALKPHOS 72  ALT 32  AST 54*  GLUCOSE 184*   Lab Results  Component Value Date   TROPONINI 0.55 (HH) 01/26/2017   No results found for: CHOL No results found for: HDL No results found for: LDLCALC No results found for: TRIG No results found for: CHOLHDL No results found for: LDLDIRECT    Radiology: Dg Chest Portable 1 View  Result Date: 01/26/2017 CLINICAL DATA:  83 year old female with acute onset of chest pain. EXAM: PORTABLE CHEST 1 VIEW COMPARISON:  None. FINDINGS: There are minimal bibasilar atelectatic changes. There is diffuse interstitial coarsening. No focal consolidation, pleural effusion, or pneumothorax. The cardiac silhouette is within normal limits. No acute osseous pathology. IMPRESSION: No active disease. Electronically Signed   By: Elgie CollardArash  Radparvar M.D.   On: 01/26/2017 03:34   Ct Angio Chest/abd/pel For Dissection W And/or W/wo  Result Date: 01/26/2017 CLINICAL DATA:  Sudden onset left-sided chest pain.  Hypertension. EXAM: CT ANGIOGRAPHY CHEST, ABDOMEN AND PELVIS TECHNIQUE: Multidetector CT imaging through the chest, abdomen and pelvis was performed using the standard protocol during bolus administration of intravenous contrast. Multiplanar reconstructed images and MIPs were obtained and reviewed to evaluate the vascular anatomy. CONTRAST:  100 mL Isovue 370 COMPARISON:  None. FINDINGS: CTA CHEST FINDINGS Cardiovascular: Unenhanced images of the chest demonstrate calcifications in the aorta and branch vessels. Coronary artery calcifications. No evidence of intramural hematoma. Normal caliber thoracic aorta. No evidence of aortic dissection. Great vessel origins are  patent. Central pulmonary arteries are well opacified without evidence of significant pulmonary embolus. Normal heart size. No pericardial effusion. Mediastinum/Nodes: No enlarged mediastinal, hilar, or axillary lymph nodes. Thyroid gland, trachea, and esophagus demonstrate no significant findings. Lungs/Pleura: Evaluation of the lungs is limited due to respiratory motion artifact. There is evidence of interstitial and airspace disease in both lower lungs possibly representing edema or multifocal pneumonia. Minimal bilateral pleural effusions. No focal consolidation. No pneumothorax. Airways are patent. Musculoskeletal: No chest wall abnormality. No acute or significant osseous findings. Review of the MIP images confirms the above findings. CTA ABDOMEN AND PELVIS FINDINGS VASCULAR Aorta: Aortic calcification.  No aneurysm or dissection. Celiac: Calcification at the origin of the celiac artery and in the splenic artery. Vessels remain patent.  SMA: Calcification of the superior mesenteric artery. Vessel remains patent. Renals: Duplicated bilateral renal arteries with calcification. Vessels remain patent. IMA: Inferior mesenteric artery is patent. Inflow: Inflow vessels are patent with diffuse calcification. Veins: No obvious venous abnormality within the limitations of this arterial phase study. Review of the MIP images confirms the above findings. NON-VASCULAR Hepatobiliary: Diffuse fatty infiltration of the liver. No focal lesions. Gallbladder is surgically absent. No bile duct dilatation. Pancreas: Unremarkable. No pancreatic ductal dilatation or surrounding inflammatory changes. Spleen: Normal in size without focal abnormality. Adrenals/Urinary Tract: Adrenal glands are unremarkable. Kidneys are normal, without renal calculi, focal lesion, or hydronephrosis. Bladder is unremarkable. Stomach/Bowel: Stomach, small bowel, and colon are not abnormally distended. No wall thickening is appreciated. Stool throughout the  colon. Appendix is surgically absent. Lymphatic: No significant lymphadenopathy. Reproductive: Status post hysterectomy. No adnexal masses. Other: No abdominal wall hernia or abnormality. No abdominopelvic ascites. Musculoskeletal: Degenerative changes in the spine. No destructive bone lesions. Review of the MIP images confirms the above findings. IMPRESSION: 1. No evidence of aneurysm or dissection of the thoracic or abdominal aorta. 2. Diffuse aortic and branch vessel atherosclerosis with diffuse calcification of the vessels. No vascular occlusion noted. 3. Diffuse fatty infiltration of the liver. 4. Interstitial and airspace infiltrates in the lung bases likely represent edema or multifocal pneumonia. Electronically Signed   By: Burman Nieves M.D.   On: 01/26/2017 04:31    EKG: Normal sinus rhythm, right bundle branch block  ASSESSMENT AND PLAN:   1. Chest pain with typical and atypical features, elevated troponin consistent with unstable angina, in patient having difficulty expressing her symptoms due to dysarthria due to prior CVA 2. History of CVA, with residual dysarthria, resident of skilled nursing, on aspirin and Plavix 3. Essential hypertension  Recommendations  1. Initial conservative management 2. Would defer full dose anticoagulation 3. Add metoprolol titrate 25 mg twice a day 4. Review 2-D echocardiogram  Signed: Marcina Millard MD,PhD, [facility] 01/26/2017, 8:38 AM

## 2017-01-26 NOTE — Progress Notes (Signed)
New orders per Dr. Darrold JunkerParaschos: heparin gtt per pharmacy protocol, troponin x2.  Patient already on ASA and Plavix.

## 2017-01-27 ENCOUNTER — Inpatient Hospital Stay
Admit: 2017-01-27 | Discharge: 2017-01-27 | Disposition: A | Discharge status: Home / Self Care | Payer: Medicare Other | Attending: Specialist | Admitting: Specialist

## 2017-01-27 LAB — CBC
HCT: 37.6 % (ref 35.0–47.0)
Hemoglobin: 13 g/dL (ref 12.0–16.0)
MCH: 31.2 pg (ref 26.0–34.0)
MCHC: 34.5 g/dL (ref 32.0–36.0)
MCV: 90.4 fL (ref 80.0–100.0)
Platelets: 224 10*3/uL (ref 150–440)
RBC: 4.16 MIL/uL (ref 3.80–5.20)
RDW: 13.7 % (ref 11.5–14.5)
WBC: 8.9 10*3/uL (ref 3.6–11.0)

## 2017-01-27 LAB — HEMOGLOBIN A1C
Hgb A1c MFr Bld: 6.7 % — ABNORMAL HIGH (ref 4.8–5.6)
Mean Plasma Glucose: 146 mg/dL

## 2017-01-27 LAB — TROPONIN I: TROPONIN I: 9.49 ng/mL — AB (ref <0.03)

## 2017-01-27 LAB — HEPARIN LEVEL (UNFRACTIONATED)
HEPARIN UNFRACTIONATED: 0.54 [IU]/mL (ref 0.30–0.70)
Heparin Unfractionated: 0.57 IU/mL (ref 0.30–0.70)

## 2017-01-27 NOTE — Progress Notes (Signed)
ANTICOAGULATION CONSULT NOTE - Initial Consult  Pharmacy Consult for Heparin Drip Indication: chest pain/ACS/elevated troponin  No Known Allergies  Patient Measurements: Height: 5\' 7"  (170.2 cm) Weight: 145 lb (65.8 kg) IBW/kg (Calculated) : 61.6 Heparin Dosing Weight: 65.8  Vital Signs: Temp: 98.5 F (36.9 C) (07/30 1955) Temp Source: Oral (07/30 1955) BP: 129/68 (07/30 1955) Pulse Rate: 109 (07/30 1955)  Labs:  Recent Labs  01/26/17 0252  01/26/17 0749 01/26/17 1351 01/26/17 1936 01/26/17 2344  HGB 13.3  --   --   --   --   --   HCT 37.6  --   --   --   --   --   PLT 256  --   --   --   --   --   APTT  --   --  31  --   --   --   LABPROT  --   --  13.7  --   --   --   INR  --   --  1.05  --   --   --   HEPARINUNFRC  --   --   --   --   --  0.57  CREATININE 0.87  --   --   --   --   --   TROPONINI 0.06*  < > 1.18* 6.83* 10.61*  --   < > = values in this interval not displayed.  Estimated Creatinine Clearance: 45.1 mL/min (by C-G formula based on SCr of 0.87 mg/dL).   Medical History: Past Medical History:  Diagnosis Date  . Anxiety   . Bleeding in brain [facility](HCC)    s/p fall  . CAD (coronary artery disease)    s/p MI 30 yrs ago  . Cerebral artery occlusion   . Depression   . Hypertension   . MI (mitral incompetence)   . Stroke (HCC) 2001  . Stroke [facility](HCC)     Medications:  Scheduled:  . aspirin  81 mg Oral Daily  . atorvastatin  40 mg Oral q1800  . clopidogrel  75 mg Oral Daily  . docusate sodium  100 mg Oral BID  . FLUoxetine  10 mg Oral Daily  . hydrochlorothiazide  25 mg Oral Daily  . isosorbide mononitrate  30 mg Oral Daily  . metoCLOPramide  5 mg Oral QPM  . metoprolol tartrate  25 mg Oral BID  . multivitamin with minerals  1 tablet Oral Daily  . pantoprazole  40 mg Oral Daily   Infusions:  . sodium chloride 100 mL/hr at 01/26/17 2204  . heparin 800 Units/hr (01/26/17 1536)    Assessment: 83 yo F with possible NSTEMI, elevated troponin.  On ASA/Plavix PTA. Hgb 13.3  Plt 256   INR pending,  aPTT pending  Goal of Therapy:  Heparin level 0.3-0.7 units/ml Monitor platelets by anticoagulation protocol: Yes   Plan:  No bolus- pt received a SubQ dose of heparin 1 hr ago Start drip at 800 units/hr Check HL in 8 hours CBC daily  7/31 @ 2344 HL 0.57 therapeutic. Will continue current rate and will redraw confirmatory level @ 0800.   Thomasene Rippleavid Sora Vrooman, PharmD, BCPS Clinical Pharmacist 01/27/2017,1:52 AM

## 2017-01-27 NOTE — Clinical Social Work Note (Signed)
CSW consulted as pt was [facility]. Pt is able to return. Pt is able to return. Full assessment to follow. CSW will continue to follow.   Alexandria QuerySarah Cierra Rothgeb, MSW, LCSW  Clinical Social Worker  [phone]

## 2017-01-27 NOTE — Progress Notes (Signed)
ANTICOAGULATION CONSULT NOTE - Initial Consult  Pharmacy Consult for Heparin Drip Indication: chest pain/ACS/elevated troponin  No Known Allergies  Patient Measurements: Height: 5\' 7"  (170.2 cm) Weight: 163 lb 12.8 oz (74.3 kg) IBW/kg (Calculated) : 61.6 Heparin Dosing Weight: 65.8  Vital Signs: Temp: 98 F (36.7 C) (07/31 0914) Temp Source: Oral (07/31 0914) BP: 121/54 (07/31 0914) Pulse Rate: 86 (07/31 0914)  Labs:  Recent Labs  01/26/17 0252  01/26/17 0749 01/26/17 1351 01/26/17 1936 01/26/17 2344 01/27/17 0154 01/27/17 0836  HGB 13.3  --   --   --   --   --  13.0  --   HCT 37.6  --   --   --   --   --  37.6  --   PLT 256  --   --   --   --   --  224  --   APTT  --   --  31  --   --   --   --   --   LABPROT  --   --  13.7  --   --   --   --   --   INR  --   --  1.05  --   --   --   --   --   HEPARINUNFRC  --   --   --   --   --  0.57  --  0.54  CREATININE 0.87  --   --   --   --   --   --   --   TROPONINI 0.06*  < > 1.18* 6.83* 10.61*  --  9.49*  --   < > = values in this interval not displayed.  Estimated Creatinine Clearance: 48.9 mL/min (by C-G formula based on SCr of 0.87 mg/dL).   Medical History: Past Medical History:  Diagnosis Date  . Anxiety   . Bleeding in brain [facility](HCC)    s/p fall  . CAD (coronary artery disease)    s/p MI 30 yrs ago  . Cerebral artery occlusion   . Depression   . Hypertension   . MI (mitral incompetence)   . Stroke (HCC) 2001  . Stroke [facility](HCC)     Medications:  Scheduled:  . aspirin  81 mg Oral Daily  . atorvastatin  40 mg Oral q1800  . clopidogrel  75 mg Oral Daily  . docusate sodium  100 mg Oral BID  . FLUoxetine  10 mg Oral Daily  . hydrochlorothiazide  25 mg Oral Daily  . isosorbide mononitrate  30 mg Oral Daily  . metoCLOPramide  5 mg Oral QPM  . metoprolol tartrate  25 mg Oral BID  . multivitamin with minerals  1 tablet Oral Daily  . pantoprazole  40 mg Oral Daily   Infusions:  . sodium chloride 100 mL/hr at  01/27/17 0924  . heparin 800 Units/hr (01/26/17 1536)    Assessment: 83 yo F with possible NSTEMI, elevated troponin. On ASA/Plavix PTA. Hgb 13.3  Plt 256   INR pending,  aPTT pending  Goal of Therapy:  Heparin level 0.3-0.7 units/ml Monitor platelets by anticoagulation protocol: Yes   Plan:  Heparin level therapeutic. This is the second therapeutic level. Continue drip at 800 units/hr and recheck in the AM along with a CBC.   Olene FlossMelissa D Yovana Scogin, Pharm.D, BCPS Clinical Pharmacist  01/27/2017,9:55 AM

## 2017-01-27 NOTE — Progress Notes (Signed)
[facility]  SUBJECTIVE: Patient denies chest pain   Vitals:   01/26/17 0600 01/26/17 0751 01/26/17 1955 01/27/17 0500  BP: 133/79 134/77 129/68   Pulse: (!) 112 (!) 108 (!) 109   Resp: 18 18 15    Temp:   98.5 F (36.9 C)   TempSrc:   Oral   SpO2: 95% 97% 94%   Weight:    74.3 kg (163 lb 12.8 oz)  Height:         Intake/Output Summary (Last 24 hours) at 01/27/17 14780839 Last data filed at 01/26/17 1831  Gross per 24 hour  Intake              240 ml  Output                0 ml  Net              240 ml      PHYSICAL EXAM  General: Well developed, well nourished, in no acute distress HEENT:  Normocephalic and atramatic Neck:  No JVD.  Lungs: Clear bilaterally to auscultation and percussion. Heart: HRRR . Normal S1 and S2 without gallops or murmurs.  Abdomen: Bowel sounds are positive, abdomen soft and non-tender  Msk:  Back normal, normal gait. Normal strength and tone for age. Extremities: No clubbing, cyanosis or edema.   Neuro: Dysarthric Psych:  Good affect, responds appropriately   LABS: Basic Metabolic Panel:  Recent Labs  29/56/2107/30/18 0252  NA 139  K 3.8  CL 102  CO2 25  GLUCOSE 184*  BUN 15  CREATININE 0.87  CALCIUM 9.4   Liver Function Tests:  Recent Labs  01/26/17 0252  AST 54*  ALT 32  ALKPHOS 72  BILITOT 1.5*  PROT 7.4  ALBUMIN 4.2   No results for input(s): LIPASE, AMYLASE in the last 72 hours. CBC:  Recent Labs  01/26/17 0252 01/27/17 0154  WBC 8.9 8.9  HGB 13.3 13.0  HCT 37.6 37.6  MCV 90.2 90.4  PLT 256 224   Cardiac Enzymes:  Recent Labs  01/26/17 1351 01/26/17 1936 01/27/17 0154  TROPONINI 6.83* 10.61* 9.49*   BNP: Invalid input(s): POCBNP D-Dimer: No results for input(s): DDIMER in the last 72 hours. Hemoglobin A1C:  Recent Labs  01/26/17 0749  HGBA1C 6.7*   Fasting Lipid Panel: No results for input(s): CHOL, HDL, LDLCALC, TRIG, CHOLHDL, LDLDIRECT in the last 72 hours. Thyroid Function Tests:  Recent  Labs  01/26/17 0749  TSH 3.660   Anemia Panel: No results for input(s): VITAMINB12, FOLATE, FERRITIN, TIBC, IRON, RETICCTPCT in the last 72 hours.  Dg Chest Portable 1 View  Result Date: 01/26/2017 CLINICAL DATA:  83 year old female with acute onset of chest pain. EXAM: PORTABLE CHEST 1 VIEW COMPARISON:  None. FINDINGS: There are minimal bibasilar atelectatic changes. There is diffuse interstitial coarsening. No focal consolidation, pleural effusion, or pneumothorax. The cardiac silhouette is within normal limits. No acute osseous pathology. IMPRESSION: No active disease. Electronically Signed   By: Elgie CollardArash  Radparvar M.D.   On: 01/26/2017 03:34   Ct Angio Chest/abd/pel For Dissection W And/or W/wo  Result Date: 01/26/2017 CLINICAL DATA:  Sudden onset left-sided chest pain.  Hypertension. EXAM: CT ANGIOGRAPHY CHEST, ABDOMEN AND PELVIS TECHNIQUE: Multidetector CT imaging through the chest, abdomen and pelvis was performed using the standard protocol during bolus administration of intravenous contrast. Multiplanar reconstructed images and MIPs were obtained and reviewed to evaluate the vascular anatomy. CONTRAST:  100 mL Isovue 370 COMPARISON:  None. FINDINGS:  CTA CHEST FINDINGS Cardiovascular: Unenhanced images of the chest demonstrate calcifications in the aorta and branch vessels. Coronary artery calcifications. No evidence of intramural hematoma. Normal caliber thoracic aorta. No evidence of aortic dissection. Great vessel origins are patent. Central pulmonary arteries are well opacified without evidence of significant pulmonary embolus. Normal heart size. No pericardial effusion. Mediastinum/Nodes: No enlarged mediastinal, hilar, or axillary lymph nodes. Thyroid gland, trachea, and esophagus demonstrate no significant findings. Lungs/Pleura: Evaluation of the lungs is limited due to respiratory motion artifact. There is evidence of interstitial and airspace disease in both lower lungs possibly  representing edema or multifocal pneumonia. Minimal bilateral pleural effusions. No focal consolidation. No pneumothorax. Airways are patent. Musculoskeletal: No chest wall abnormality. No acute or significant osseous findings. Review of the MIP images confirms the above findings. CTA ABDOMEN AND PELVIS FINDINGS VASCULAR Aorta: Aortic calcification.  No aneurysm or dissection. Celiac: Calcification at the origin of the celiac artery and in the splenic artery. Vessels remain patent. SMA: Calcification of the superior mesenteric artery. Vessel remains patent. Renals: Duplicated bilateral renal arteries with calcification. Vessels remain patent. IMA: Inferior mesenteric artery is patent. Inflow: Inflow vessels are patent with diffuse calcification. Veins: No obvious venous abnormality within the limitations of this arterial phase study. Review of the MIP images confirms the above findings. NON-VASCULAR Hepatobiliary: Diffuse fatty infiltration of the liver. No focal lesions. Gallbladder is surgically absent. No bile duct dilatation. Pancreas: Unremarkable. No pancreatic ductal dilatation or surrounding inflammatory changes. Spleen: Normal in size without focal abnormality. Adrenals/Urinary Tract: Adrenal glands are unremarkable. Kidneys are normal, without renal calculi, focal lesion, or hydronephrosis. Bladder is unremarkable. Stomach/Bowel: Stomach, small bowel, and colon are not abnormally distended. No wall thickening is appreciated. Stool throughout the colon. Appendix is surgically absent. Lymphatic: No significant lymphadenopathy. Reproductive: Status post hysterectomy. No adnexal masses. Other: No abdominal wall hernia or abnormality. No abdominopelvic ascites. Musculoskeletal: Degenerative changes in the spine. No destructive bone lesions. Review of the MIP images confirms the above findings. IMPRESSION: 1. No evidence of aneurysm or dissection of the thoracic or abdominal aorta. 2. Diffuse aortic and branch  vessel atherosclerosis with diffuse calcification of the vessels. No vascular occlusion noted. 3. Diffuse fatty infiltration of the liver. 4. Interstitial and airspace infiltrates in the lung bases likely represent edema or multifocal pneumonia. Electronically Signed   By: Burman NievesWilliam  Stevens M.D.   On: 01/26/2017 04:31     Echo pending  TELEMETRY: Sinus rhythm:  ASSESSMENT AND PLAN:  Active Problems:   Chest pain   NSTEMI (non-ST elevated myocardial infarction) (HCC)    1. Non-STEMI, troponins trending down, no recurrent chest pain 2. History of CVA, with residual dysarthria, resident of skilled nursing, on aspirin and Plavix 3. Essential hypertension, blood pressure under good control  Recommendations  1. Continue conservative management 2. Continue aspirin and Plavix 3. Continue heparin drip for total of 24-48 hours, DC in a.m. 4. Review 2-D echocardiogram   Marcina MillardAlexander Danae Oland, MD, PhD, [facility] 01/27/2017 8:39 AM

## 2017-01-27 NOTE — NC FL2 (Signed)
[place] MEDICAID FL2 LEVEL OF CARE SCREENING TOOL     IDENTIFICATION  Patient Name: Alexandria Hanson Birthdate: 1928-07-14 Sex: female Admission Date (Current Location): 01/26/2017  [place]   Facility and Address:  [facility], [address], [place], [place] [address]      Provider Number: [id number]  Attending Physician Name and Address:  Houston SirenSainani, Vivek J, MD  Relative Name and Phone Number:       Current Level of Care: Hospital Recommended Level of Care: Skilled Nursing Facility Prior Approval Number:    Date Approved/Denied:   PASRR Number: [id number]  Discharge Plan: SNF    Current Diagnoses: Patient Active Problem List   Diagnosis Date Noted  . Chest pain 01/26/2017  . NSTEMI (non-ST elevated myocardial infarction) (HCC) 01/26/2017  . Stroke (HCC) 03/08/2016  . Depression   . Anxiety   . Hypertension     Orientation RESPIRATION BLADDER Height & Weight     Self, Place  Normal Incontinent Weight: 163 lb 12.8 oz (74.3 kg) Height:  5\' 7"  (170.2 cm)  BEHAVIORAL SYMPTOMS/MOOD NEUROLOGICAL BOWEL NUTRITION STATUS      Incontinent Diet (Heart Healthy, Thin Liquids)  AMBULATORY STATUS COMMUNICATION OF NEEDS Skin   Extensive Assist Verbally Normal                       Personal Care Assistance Level of Assistance  Bathing, Feeding, Dressing Bathing Assistance: Maximum assistance Feeding assistance: Limited assistance Dressing Assistance: Maximum assistance     Functional Limitations Info  Sight, Hearing, Speech Sight Info: Adequate Hearing Info: Adequate Speech Info: Impaired    SPECIAL CARE FACTORS FREQUENCY                       Contractures Contractures Info: Not present    Additional Factors Info  Code Status, Allergies, Psychotropic Code Status Info: DNR Allergies Info: No known allergies Psychotropic Info: Medications:  Prozac         Current Medications (01/27/2017):   This is the current hospital active medication list Current Facility-Administered Medications  Medication Dose Route Frequency Provider Last Rate Last Dose  . acetaminophen (TYLENOL) tablet 650 mg  650 mg Oral Q6H PRN Arnaldo Nataliamond, Michael S, MD       Or  . acetaminophen (TYLENOL) suppository 650 mg  650 mg Rectal Q6H PRN Arnaldo Nataliamond, Michael S, MD      . aspirin chewable tablet 81 mg  81 mg Oral Daily Arnaldo Nataliamond, Michael S, MD   81 mg at 01/27/17 0919  . atorvastatin (LIPITOR) tablet 40 mg  40 mg Oral q1800 Arnaldo Nataliamond, Michael S, MD   40 mg at 01/26/17 1751  . clopidogrel (PLAVIX) tablet 75 mg  75 mg Oral Daily Arnaldo Nataliamond, Michael S, MD   75 mg at 01/27/17 0919  . docusate sodium (COLACE) capsule 100 mg  100 mg Oral BID Arnaldo Nataliamond, Michael S, MD   100 mg at 01/27/17 0919  . FLUoxetine (PROZAC) capsule 10 mg  10 mg Oral Daily Arnaldo Nataliamond, Michael S, MD   10 mg at 01/27/17 0919  . guaifenesin (ROBITUSSIN) 100 MG/5ML syrup 100 mg  100 mg Oral Q4H PRN Arnaldo Nataliamond, Michael S, MD      . heparin ADULT infusion 100 units/mL (25000 units/25250mL sodium chloride 0.45%)  800 Units/hr Intravenous Continuous Malena PeerMaccia, Melissa D, RPH 8 mL/hr at 01/26/17 1536 800 Units/hr at 01/26/17 1536  . hydrochlorothiazide (HYDRODIURIL) tablet 25 mg  25  mg Oral Daily Arnaldo Nataliamond, Michael S, MD   25 mg at 01/27/17 0919  . isosorbide mononitrate (IMDUR) 24 hr tablet 30 mg  30 mg Oral Daily Arnaldo Nataliamond, Michael S, MD   30 mg at 01/27/17 0919  . metoCLOPramide (REGLAN) tablet 5 mg  5 mg Oral QPM Arnaldo Nataliamond, Michael S, MD   5 mg at 01/26/17 1751  . metoprolol tartrate (LOPRESSOR) tablet 25 mg  25 mg Oral BID Paraschos, Alexander, MD   25 mg at 01/27/17 0919  . multivitamin with minerals tablet 1 tablet  1 tablet Oral Daily Arnaldo Nataliamond, Michael S, MD   1 tablet at 01/27/17 0919  . nitroGLYCERIN (NITROSTAT) SL tablet 0.4 mg  0.4 mg Sublingual Q5 min PRN Arnaldo Nataliamond, Michael S, MD      . ondansetron  [facility](ZOFRAN) tablet 4 mg  4 mg Oral Q6H PRN Arnaldo Nataliamond, Michael S, MD       Or  .  ondansetron [facility](ZOFRAN) injection 4 mg  4 mg Intravenous Q6H PRN Arnaldo Nataliamond, Michael S, MD      . pantoprazole (PROTONIX) EC tablet 40 mg  40 mg Oral Daily Arnaldo Nataliamond, Michael S, MD   40 mg at 01/27/17 [id number]     Discharge Medications: Please see discharge summary for a list of discharge medications.  Relevant Imaging Results:  Relevant Lab Results:   Additional Information SSN:  [id number]  Dede QuerySarah Lion Fernandez, LCSW

## 2017-01-27 NOTE — Progress Notes (Signed)
[employer] [facility] [facility] [place] [facility]   PATIENT NAME: Alexandria Hanson    MR#:  [id number]  DATE OF BIRTH:  03/14/1929  SUBJECTIVE:   Patient has now ruled in for a non-ST elevation MI. She still denies chest pain or any shortness of breath. Difficult to understand due to her significant dysarthria from previous CVA. Seen by cardiology and continue conservative management for now.  REVIEW OF SYSTEMS:    Review of Systems  Constitutional: Negative for chills and fever.  HENT: Negative for congestion and tinnitus.   Eyes: Negative for blurred vision and double vision.  Respiratory: Negative for cough, shortness of breath and wheezing.   Cardiovascular: Negative for chest pain, orthopnea and PND.  Gastrointestinal: Negative for abdominal pain, diarrhea, nausea and vomiting.  Genitourinary: Negative for dysuria and hematuria.  Neurological: Negative for dizziness, sensory change and focal weakness.  All other systems reviewed and are negative.   Nutrition: heart healthy Tolerating Diet: Yes Tolerating PT: She is bedbound at baseline   DRUG ALLERGIES:  No Known Allergies  VITALS:  Blood pressure 123/66, pulse 81, temperature 98.2 F (36.8 C), resp. rate 18, height 5\' 7"  (1.702 m), weight 74.3 kg (163 lb 12.8 oz), SpO2 99 %.  PHYSICAL EXAMINATION:   Physical Exam  GENERAL:  83 y.o.-year-old patient lying in bed in no acute distress.  EYES: Pupils equal, round, reactive to light and accommodation. No scleral icterus. Extraocular muscles intact.  HEENT: Head atraumatic, normocephalic. Oropharynx and nasopharynx clear.  NECK:  Supple, no jugular venous distention. No thyroid enlargement, no tenderness.  LUNGS: Normal breath sounds bilaterally, no wheezing, rales, rhonchi. No use of accessory muscles of respiration.  CARDIOVASCULAR: S1, S2 normal. No murmurs, rubs, or gallops.  ABDOMEN: Soft, nontender, nondistended. Bowel sounds present. No organomegaly or mass.   EXTREMITIES: No cyanosis, clubbing or edema b/l.    NEUROLOGIC: Cranial nerves II through XII are intact. Dense right-sided weakness from previous CVA. Bedbound at baseline. Positive dysarthria from previous CVA. PSYCHIATRIC: The patient is alert and oriented x 3.  SKIN: No obvious rash, lesion, or ulcer.    LABORATORY PANEL:   CBC  Recent Labs Lab 01/27/17 0154  WBC 8.9  HGB 13.0  HCT 37.6  PLT 224   ------------------------------------------------------------------------------------------------------------------  Chemistries   Recent Labs Lab 01/26/17 0252  NA 139  K 3.8  CL 102  CO2 25  GLUCOSE 184*  BUN 15  CREATININE 0.87  CALCIUM 9.4  AST 54*  ALT 32  ALKPHOS 72  BILITOT 1.5*   ------------------------------------------------------------------------------------------------------------------  Cardiac Enzymes  Recent Labs Lab 01/27/17 0154  TROPONINI 9.49*   ------------------------------------------------------------------------------------------------------------------  RADIOLOGY:  Dg Chest Portable 1 View  Result Date: 01/26/2017 CLINICAL DATA:  83 year old female with acute onset of chest pain. EXAM: PORTABLE CHEST 1 VIEW COMPARISON:  None. FINDINGS: There are minimal bibasilar atelectatic changes. There is diffuse interstitial coarsening. No focal consolidation, pleural effusion, or pneumothorax. The cardiac silhouette is within normal limits. No acute osseous pathology. IMPRESSION: No active disease. Electronically Signed   By: Elgie CollardArash  Radparvar M.D.   On: 01/26/2017 03:34   Ct Angio Chest/abd/pel For Dissection W And/or W/wo  Result Date: 01/26/2017 CLINICAL DATA:  Sudden onset left-sided chest pain.  Hypertension. EXAM: CT ANGIOGRAPHY CHEST, ABDOMEN AND PELVIS TECHNIQUE: Multidetector CT imaging through the chest, abdomen and pelvis was performed using the standard protocol during bolus administration of intravenous contrast. Multiplanar  reconstructed images and MIPs were obtained and reviewed to evaluate the vascular anatomy.  CONTRAST:  100 mL Isovue 370 COMPARISON:  None. FINDINGS: CTA CHEST FINDINGS Cardiovascular: Unenhanced images of the chest demonstrate calcifications in the aorta and branch vessels. Coronary artery calcifications. No evidence of intramural hematoma. Normal caliber thoracic aorta. No evidence of aortic dissection. Great vessel origins are patent. Central pulmonary arteries are well opacified without evidence of significant pulmonary embolus. Normal heart size. No pericardial effusion. Mediastinum/Nodes: No enlarged mediastinal, hilar, or axillary lymph nodes. Thyroid gland, trachea, and esophagus demonstrate no significant findings. Lungs/Pleura: Evaluation of the lungs is limited due to respiratory motion artifact. There is evidence of interstitial and airspace disease in both lower lungs possibly representing edema or multifocal pneumonia. Minimal bilateral pleural effusions. No focal consolidation. No pneumothorax. Airways are patent. Musculoskeletal: No chest wall abnormality. No acute or significant osseous findings. Review of the MIP images confirms the above findings. CTA ABDOMEN AND PELVIS FINDINGS VASCULAR Aorta: Aortic calcification.  No aneurysm or dissection. Celiac: Calcification at the origin of the celiac artery and in the splenic artery. Vessels remain patent. SMA: Calcification of the superior mesenteric artery. Vessel remains patent. Renals: Duplicated bilateral renal arteries with calcification. Vessels remain patent. IMA: Inferior mesenteric artery is patent. Inflow: Inflow vessels are patent with diffuse calcification. Veins: No obvious venous abnormality within the limitations of this arterial phase study. Review of the MIP images confirms the above findings. NON-VASCULAR Hepatobiliary: Diffuse fatty infiltration of the liver. No focal lesions. Gallbladder is surgically absent. No bile duct dilatation.  Pancreas: Unremarkable. No pancreatic ductal dilatation or surrounding inflammatory changes. Spleen: Normal in size without focal abnormality. Adrenals/Urinary Tract: Adrenal glands are unremarkable. Kidneys are normal, without renal calculi, focal lesion, or hydronephrosis. Bladder is unremarkable. Stomach/Bowel: Stomach, small bowel, and colon are not abnormally distended. No wall thickening is appreciated. Stool throughout the colon. Appendix is surgically absent. Lymphatic: No significant lymphadenopathy. Reproductive: Status post hysterectomy. No adnexal masses. Other: No abdominal wall hernia or abnormality. No abdominopelvic ascites. Musculoskeletal: Degenerative changes in the spine. No destructive bone lesions. Review of the MIP images confirms the above findings. IMPRESSION: 1. No evidence of aneurysm or dissection of the thoracic or abdominal aorta. 2. Diffuse aortic and branch vessel atherosclerosis with diffuse calcification of the vessels. No vascular occlusion noted. 3. Diffuse fatty infiltration of the liver. 4. Interstitial and airspace infiltrates in the lung bases likely represent edema or multifocal pneumonia. Electronically Signed   By: Burman NievesWilliam  Stevens M.D.   On: 01/26/2017 04:31     ASSESSMENT AND PLAN:   83 year old female with past medical history of previous CVA, hypertension, history of coronary artery disease, previous history of MI who presented to the hospital with chest pain.  1. Non-ST elevation MI-patient has ruled in by cardiac markers given her troponin elevation and has peaked as high as 10. -Patient presently denies any chest pain, shortness of breath. -Seen by cardiology and no plans for acute intervention presently. -Continue aspirin, Plavix, atorvastatin, Imdur, heparin drip, Metoprolol. - Await Echo results.  Cont. Heparin for another 24 hrs.    2. GERD-continue Protonix.  3. Essential hypertension-continue Imdur, metoprolol, HCTZ  4. Depression-continue  Prozac.  5. History of previous CVA-continue aspirin, Plavix, statin.  Likely discharge back to Assisted Living tomorrow.   All the records are reviewed and case discussed with Care Management/Social Worker. Management plans discussed with the patient, family and they are in agreement.  CODE STATUS: DO NOT RESUSCITATE  DVT Prophylaxis: Heparin drip  TOTAL TIME TAKING CARE OF THIS PATIENT: 25 minutes.  POSSIBLE D/C IN 1-2 DAYS, DEPENDING ON CLINICAL CONDITION.   Houston Siren M.D on 01/27/2017 at 2:55 PM  Between 7am to 6pm - Pager - [phone]  After 6pm go to [email or web address] - Scientist, research (life sciences) [facility]  Office  [phone]  CC: Primary care physician; Gabriel Cirri, NP

## 2017-01-28 DIAGNOSIS — Z8673 Personal history of transient ischemic attack (TIA), and cerebral infarction without residual deficits: Secondary | ICD-10-CM | POA: Diagnosis not present

## 2017-01-28 DIAGNOSIS — Z7401 Bed confinement status: Secondary | ICD-10-CM | POA: Diagnosis not present

## 2017-01-28 DIAGNOSIS — I2510 Atherosclerotic heart disease of native coronary artery without angina pectoris: Secondary | ICD-10-CM | POA: Diagnosis not present

## 2017-01-28 DIAGNOSIS — R4701 Aphasia: Secondary | ICD-10-CM | POA: Diagnosis not present

## 2017-01-28 DIAGNOSIS — I5020 Unspecified systolic (congestive) heart failure: Secondary | ICD-10-CM | POA: Diagnosis not present

## 2017-01-28 DIAGNOSIS — R079 Chest pain, unspecified: Secondary | ICD-10-CM | POA: Diagnosis not present

## 2017-01-28 DIAGNOSIS — I214 Non-ST elevation (NSTEMI) myocardial infarction: Secondary | ICD-10-CM | POA: Diagnosis not present

## 2017-01-28 DIAGNOSIS — R7989 Other specified abnormal findings of blood chemistry: Secondary | ICD-10-CM | POA: Diagnosis not present

## 2017-01-28 DIAGNOSIS — I10 Essential (primary) hypertension: Secondary | ICD-10-CM | POA: Diagnosis not present

## 2017-01-28 LAB — CBC
HEMATOCRIT: 36.5 % (ref 35.0–47.0)
HEMOGLOBIN: 12.8 g/dL (ref 12.0–16.0)
MCH: 32.1 pg (ref 26.0–34.0)
MCHC: 35 g/dL (ref 32.0–36.0)
MCV: 91.7 fL (ref 80.0–100.0)
Platelets: 203 10*3/uL (ref 150–440)
RBC: 3.98 MIL/uL (ref 3.80–5.20)
RDW: 13.7 % (ref 11.5–14.5)
WBC: 6.9 10*3/uL (ref 3.6–11.0)

## 2017-01-28 LAB — ECHOCARDIOGRAM COMPLETE
HEIGHTINCHES: 67 in
Weight: 2620.8 oz

## 2017-01-28 LAB — HEPARIN LEVEL (UNFRACTIONATED): HEPARIN UNFRACTIONATED: 0.46 [IU]/mL (ref 0.30–0.70)

## 2017-01-28 MED ORDER — METOPROLOL TARTRATE 25 MG PO TABS: 25.0000 mg | ORAL_TABLET | Freq: Two times a day (BID) | ORAL | 1 refills | Status: DC

## 2017-01-28 MED ORDER — GI COCKTAIL ~~LOC~~: 30.0000 mL | Freq: Three times a day (TID) | ORAL | 0 refills | Status: DC | PRN

## 2017-01-28 MED ORDER — GI COCKTAIL ~~LOC~~
30.0000 mL | Freq: Three times a day (TID) | ORAL | Status: DC | PRN
  Filled 2017-01-28: qty 30

## 2017-01-28 NOTE — Progress Notes (Signed)
Patient discharged to [facility] as ordered,report called to receiving facility ,vital signs within normal limits,transported by EMS

## 2017-01-28 NOTE — Clinical Social Work Note (Signed)
Clinical Social Work Assessment  Patient Details  Name: Alexandria Hanson MRN: [id number] Date of Birth: [birth year]  Date of referral:  01/28/17               Reason for consult:  Facility Placement, Discharge Planning                Permission sought to share information with:  Family Supports Permission granted to share information::  Yes, Verbal Permission Granted  Name::     Eilene Ghazi  Relationship::  daughter  Contact Information:  [phone]  Housing/Transportation Living arrangements for the past 2 months:  [place] of Information:  Patient, Adult Children Patient Interpreter Needed:  None Criminal Activity/Legal Involvement Pertinent to Current Situation/Hospitalization:  No - Comment as needed Significant Relationships:  Adult Children, Other Family Members Lives with:  Facility Resident Do you feel safe going back to the place where you live?  Yes Need for family participation in patient care:  Yes (Comment)  Care giving concerns:  No care giving concerns identified.   Social Worker assessment / plan:  CSW met with pt address consult. CSW introduced herself and explained role of social work. CSW also explained that pt was ready for discharge today. Pt was aware, however wanted CSW to call her daughter, Izora Gala. CSW spoke with pt's daughter to address consult. CSW introduced herself and explained role of social work. Pt was admitted from [facility] and is able to return. Pt's daughter is agreeable to discharge plan. Facility is ready to accept pt as they have received discharge information. RN will call report. [facility] will provide transportation. CSW is signing off as no further needs identified.   Employment status:  Retired Nurse, adult PT Recommendations:  Not assessed at this time Information / Referral to community resources:  Other (Comment Required) ([facility])  Patient/Family's Response to care:  Pt's  daughter was appreciative of CSW support.   Patient/Family's Understanding of and Emotional Response to Diagnosis, Current Treatment, and Prognosis:  Pt's daughter understands that pt is to return to facility.   Emotional Assessment Appearance:  Appears stated age Attitude/Demeanor/Rapport:   (Appropriate) Affect (typically observed):  Accepting, Pleasant Orientation:  Oriented to Self Alcohol / Substance use:  Not Applicable Psych involvement (Current and /or in the community):  No (Comment)  Discharge Needs  Concerns to be addressed:  Adjustment to Illness Readmission within the last 30 days:  No Current discharge risk:  Chronically ill Barriers to Discharge:  No Barriers Identified   Darden Dates, LCSW 01/28/2017, 10:32 AM

## 2017-01-28 NOTE — Discharge Summary (Addendum)
[facility] [place] [facility]   PATIENT NAME: Alexandria Hanson    MR#:  [id number]  DATE OF BIRTH:  [birth year]  DATE OF ADMISSION:  01/26/2017   ADMITTING PHYSICIAN: Arnaldo NatalMichael S Diamond, MD  DATE OF DISCHARGE:  01/28/2017 PRIMARY CARE PHYSICIAN: Gabriel CirriWicker, Cheryl, NP   ADMISSION DIAGNOSIS:  NSTEMI (non-ST elevated myocardial infarction) (HCC) [I21.4] DISCHARGE DIAGNOSIS:  Active Problems:   Chest pain   NSTEMI (non-ST elevated myocardial infarction) (HCC)  SECONDARY DIAGNOSIS:   Past Medical History:  Diagnosis Date  . Anxiety   . Bleeding in brain [facility](HCC)    s/p fall  . CAD (coronary artery disease)    s/p MI 30 yrs ago  . Cerebral artery occlusion   . Depression   . Hypertension   . MI (mitral incompetence)   . Stroke (HCC) 2001  . Stroke [facility](HCC)    HOSPITAL COURSE:   83 year old female with past medical history of previous CVA, hypertension, history of coronary artery disease, previous history of MI who presented to the hospital with chest pain.  1. Non-ST elevation MI-patient has ruled in by cardiac markers given her troponin elevation and has peaked as high as 10. -Patient presently denies any chest pain, shortness of breath. -Seen by cardiology and no plans for acute intervention presently. -Continue aspirin, Plavix, atorvastatin, Imdur, Metoprolol. - Await Echo results.  completed Heparin drip for another 48 hrs, discontinue heparin drip and can discharge after PT per Dr. Darrold JunkerParaschos. LV EF: 30% -   35%%, not a candidate for cardiac rehab due to physical limitation.  2. GERD-continue Protonix. GI cocktail prn.  3. Essential hypertension-continue Imdur, metoprolol, HCTZ  4. Depression-continue Prozac.  5. History of previous CVA-continue aspirin, Plavix, statin.  I discussed with Dr. Darrold JunkerParaschos. DISCHARGE CONDITIONS:  Stable, discharge to SNF today. CONSULTS OBTAINED:  Treatment Team:  Marcina MillardParaschos, Alexander, MD DRUG ALLERGIES:  No Known  Allergies DISCHARGE MEDICATIONS:   Allergies as of 01/28/2017   No Known Allergies     Medication List    TAKE these medications   aspirin 81 MG chewable tablet Chew 81 mg by mouth daily.   atorvastatin 40 MG tablet Commonly known as:  LIPITOR Take 1 tablet (40 mg total) by mouth daily at 6 PM.   clopidogrel 75 MG tablet Commonly known as:  PLAVIX Take 1 tablet (75 mg total) by mouth daily.   FLUoxetine 10 MG capsule Commonly known as:  PROZAC Take 10 mg by mouth daily.   gi cocktail Susp suspension Take 30 mLs by mouth 3 (three) times daily as needed for indigestion. Shake well.   guaifenesin 100 MG/5ML syrup Commonly known as:  ROBITUSSIN Take 100 mg by mouth every 4 (four) hours as needed for cough.   hydrochlorothiazide 25 MG tablet Commonly known as:  HYDRODIURIL Take 1 tablet (25 mg total) by mouth daily.   isosorbide mononitrate 30 MG 24 hr tablet Commonly known as:  IMDUR Take 30 mg by mouth daily.   metoCLOPramide 5 MG tablet Commonly known as:  REGLAN Take 5 mg by mouth every evening.   metoprolol tartrate 25 MG tablet Commonly known as:  LOPRESSOR Take 1 tablet (25 mg total) by mouth 2 (two) times daily.   multivitamin tablet Take 1 tablet by mouth daily.   nitroGLYCERIN 0.4 MG/SPRAY spray Commonly known as:  NITROLINGUAL Place 1 spray under the tongue every 5 (five) minutes x 3 doses as needed for chest pain.   pantoprazole 40 MG tablet Commonly  known as:  PROTONIX Take 40 mg by mouth daily.        DISCHARGE INSTRUCTIONS:  See AVS. If you experience worsening of your admission symptoms, develop shortness of breath, life threatening emergency, suicidal or homicidal thoughts you must seek medical attention immediately by calling 911 or calling your MD immediately  if symptoms less severe.  You Must read complete instructions/literature along with all the possible adverse reactions/side effects for all the Medicines you take and that have been  prescribed to you. Take any new Medicines after you have completely understood and accpet all the possible adverse reactions/side effects.   Please note  You were cared for by a hospitalist during your hospital stay. If you have any questions about your discharge medications or the care you received while you were in the hospital after you are discharged, you can call the unit and asked to speak with the hospitalist on call if the hospitalist that took care of you is not available. Once you are discharged, your primary care physician will handle any further medical issues. Please note that NO REFILLS for any discharge medications will be authorized once you are discharged, as it is imperative that you return to your primary care physician (or establish a relationship with a primary care physician if you do not have one) for your aftercare needs so that they can reassess your need for medications and monitor your lab values.    On the day of Discharge:  VITAL SIGNS:  Blood pressure (!) 107/57, pulse 87, temperature 98 F (36.7 C), temperature source Oral, resp. rate 18, height 5\' 7"  (1.702 m), weight 157 lb 8 oz (71.4 kg), SpO2 96 %. PHYSICAL EXAMINATION:  GENERAL:  83 y.o.-year-old patient lying in the bed with no acute distress.  EYES: Pupils equal, round, reactive to light and accommodation. No scleral icterus. Extraocular muscles intact.  HEENT: Head atraumatic, normocephalic. Oropharynx and nasopharynx clear.  NECK:  Supple, no jugular venous distention. No thyroid enlargement, no tenderness.  LUNGS: Normal breath sounds bilaterally, no wheezing, rales,rhonchi or crepitation. No use of accessory muscles of respiration.  CARDIOVASCULAR: S1, S2 normal. No murmurs, rubs, or gallops.  ABDOMEN: Soft, non-tender, non-distended. Bowel sounds present. No organomegaly or mass.  EXTREMITIES: No pedal edema, cyanosis, or clubbing.  NEUROLOGIC:  Right-sided weakness from previous CVA. Bedbound at  baseline. Positive dysarthria from previous CVA. PSYCHIATRIC: The patient is alert and oriented x 3.  SKIN: No obvious rash, lesion, or ulcer.  DATA REVIEW:   CBC  Recent Labs Lab 01/28/17 0320  WBC 6.9  HGB 12.8  HCT 36.5  PLT 203    Chemistries   Recent Labs Lab 01/26/17 0252  NA 139  K 3.8  CL 102  CO2 25  GLUCOSE 184*  BUN 15  CREATININE 0.87  CALCIUM 9.4  AST 54*  ALT 32  ALKPHOS 72  BILITOT 1.5*     Microbiology Results  Results for orders placed or performed during the hospital encounter of 01/26/17  MRSA PCR Screening     Status: None   Collection Time: 01/26/17 11:45 AM  Result Value Ref Range Status   MRSA by PCR NEGATIVE NEGATIVE Final    Comment:        The GeneXpert MRSA Assay (FDA approved for NASAL specimens only), is one component of a comprehensive MRSA colonization surveillance program. It is not intended to diagnose MRSA infection nor to guide or monitor treatment for MRSA infections.     RADIOLOGY:  No results found.   Management plans discussed with the patient, family and they are in agreement.  CODE STATUS: DNR   TOTAL TIME TAKING CARE OF THIS PATIENT: 35 minutes.    Shaune Pollackhen, Shantanu Strauch M.D on 01/28/2017 at 9:51 AM  Between 7am to 6pm - Pager - [phone]  After 6pm go to [email or web address] - Social research officer, governmentpassword [email or web address] [facility]  [facility] [place] [facility]  Office  [phone]  CC: Primary care physician; Gabriel CirriWicker, Cheryl, NP   Note: This dictation was prepared with Dragon dictation along with smaller phrase technology. Any transcriptional errors that result from this process are unintentional.

## 2017-01-28 NOTE — Progress Notes (Signed)
ANTICOAGULATION CONSULT NOTE - Initial Consult  Pharmacy Consult for Heparin Drip Indication: chest pain/ACS/elevated troponin  No Known Allergies  Patient Measurements: Height: 5\' 7"  (170.2 cm) Weight: 163 lb 12.8 oz (74.3 kg) IBW/kg (Calculated) : 61.6 Heparin Dosing Weight: 65.8  Vital Signs: Temp: 99 F (37.2 C) (07/31 2022) Temp Source: Oral (07/31 2022) BP: 134/62 (07/31 2022) Pulse Rate: 87 (07/31 2022)  Labs:  Recent Labs  01/26/17 0252  01/26/17 0749 01/26/17 1351 01/26/17 1936 01/26/17 2344 01/27/17 0154 01/27/17 0836 01/28/17 0320  HGB 13.3  --   --   --   --   --  13.0  --  12.8  HCT 37.6  --   --   --   --   --  37.6  --  36.5  PLT 256  --   --   --   --   --  224  --  203  APTT  --   --  31  --   --   --   --   --   --   LABPROT  --   --  13.7  --   --   --   --   --   --   INR  --   --  1.05  --   --   --   --   --   --   HEPARINUNFRC  --   --   --   --   --  0.57  --  0.54 0.46  CREATININE 0.87  --   --   --   --   --   --   --   --   TROPONINI 0.06*  < > 1.18* 6.83* 10.61*  --  9.49*  --   --   < > = values in this interval not displayed.  Estimated Creatinine Clearance: 48.9 mL/min (by C-G formula based on SCr of 0.87 mg/dL).   Medical History: Past Medical History:  Diagnosis Date  . Anxiety   . Bleeding in brain [facility](HCC)    s/p fall  . CAD (coronary artery disease)    s/p MI 30 yrs ago  . Cerebral artery occlusion   . Depression   . Hypertension   . MI (mitral incompetence)   . Stroke (HCC) 2001  . Stroke [facility](HCC)     Medications:  Scheduled:  . aspirin  81 mg Oral Daily  . atorvastatin  40 mg Oral q1800  . clopidogrel  75 mg Oral Daily  . docusate sodium  100 mg Oral BID  . FLUoxetine  10 mg Oral Daily  . hydrochlorothiazide  25 mg Oral Daily  . isosorbide mononitrate  30 mg Oral Daily  . metoCLOPramide  5 mg Oral QPM  . metoprolol tartrate  25 mg Oral BID  . multivitamin with minerals  1 tablet Oral Daily  . pantoprazole  40 mg  Oral Daily   Infusions:  . heparin 800 Units/hr (01/27/17 1737)    Assessment: 83 yo F with possible NSTEMI, elevated troponin. On ASA/Plavix PTA. Hgb 13.3  Plt 256   INR pending,  aPTT pending  Goal of Therapy:  Heparin level 0.3-0.7 units/ml Monitor platelets by anticoagulation protocol: Yes   Plan:  Heparin level therapeutic. This is the second therapeutic level. Continue drip at 800 units/hr and recheck in the AM along with a CBC.  8/1 @ 0320 HL 0.46 therapeutic. Will continue current rate and will recheck HL/CBC w/ am labs.  Thomasene Rippleavid Armonie Staten, PharmD, BCPS Clinical Pharmacist 01/28/2017,5:12 AM

## 2017-01-28 NOTE — Evaluation (Signed)
Physical Therapy Evaluation Patient Details Name: Alexandria Hanson MRN: [id number] DOB: [birth year] Today's Date: 01/28/2017   History of Present Illness  Pt is a 83 y/o F who presented with chest pain. Troponin was elevated and pt was ruled in for an NSTEMI. Pt's PMH includes dysarthria due to h/o stroke, MI.    Clinical Impression  Pt admitted with above diagnosis. Pt currently with functional limitations due to the deficits listed below (see PT Problem List). Ms. Alexandria Hanson presents with residual baseline R sided weakness and dysarthria from previous stroke. She completed therapeutic exercises in supine and tolerated sitting EOB well for several minutes.  She currently requires min>max assist for bed mobility.  Pt reports she does not perform bed mobility with staff at her SNF but would benefit from rehab at SNF at d/c to promote sitting upright activity for improved overall health.  Pt will benefit from skilled PT to increase their independence and safety with mobility to allow discharge to the venue listed below.      Follow Up Recommendations SNF    Equipment Recommendations  None recommended by PT    Recommendations for Other Services       Precautions / Restrictions Precautions Precautions: Fall Restrictions Weight Bearing Restrictions: No      Mobility  Bed Mobility Overal bed mobility: Needs Assistance Bed Mobility: Supine to Sit;Sit to Supine     Supine to sit: Min assist;HOB elevated Sit to supine: Max assist   General bed mobility comments: Cues for technique using bed rail.  Assist to elevate trunk and use of bed pad to assist with scooting to EOB to sit.  To return to supine the pt requires assist for trunk but is able to bring BLEs into bed without assist.    Transfers                 General transfer comment: Did not attempt, pt nonambulatory at baseline.  Ambulation/Gait                Stairs            Wheelchair Mobility    Modified Rankin  (Stroke Patients Only)       Balance Overall balance assessment: Needs assistance Sitting-balance support: Single extremity supported;Feet supported Sitting balance-Leahy Scale: Poor Sitting balance - Comments: Pt able to sit EOB with 1UE supported and close min guard assist.  She demonstrates SOB but SpO2 96%.                                     Pertinent Vitals/Pain Pain Assessment: Faces Faces Pain Scale: No hurt Pain Intervention(s): Monitored during session    Home Living Family/patient expects to be discharged to:: Skilled nursing facility                 Additional Comments: [facility] per RN    Prior Function Level of Independence: Needs assistance   Gait / Transfers Assistance Needed: Pt reports she has been non ambulatory for years.  Per RN the pt is transferred to her WC [facility]  Most likely a lift is used.  ADL's / Homemaking Assistance Needed: Pt requires assist for bathing, dressing. Meals are delivered to the pt's room and the pt reports she can feed herself using LUE.          Hand  Dominance   Dominant Hand: Right (but very limited use of RUE since CVA)    Extremity/Trunk Assessment   Upper Extremity Assessment Upper Extremity Assessment: RUE deficits/detail;LUE deficits/detail RUE Deficits / Details: R shoulder strength grossly 2/5, R fingers held in flexion LUE Deficits / Details: LUE strength grossly 3/5    Lower Extremity Assessment Lower Extremity Assessment: RLE deficits/detail;LLE deficits/detail RLE Deficits / Details: Hip strength grossly 2/5, pt unable to perform DF, ankle resting in inversion and PF.   LLE Deficits / Details: Strength grossly 3-/5       Communication   Communication: Expressive difficulties (asked yes/no questions this session)  Cognition Arousal/Alertness: Awake/alert Behavior During Therapy: Flat affect Overall Cognitive Status: Difficult  to assess                                        General Comments      Exercises General Exercises - Upper Extremity Shoulder Flexion: AROM;Both;10 reps;Supine General Exercises - Lower Extremity Ankle Circles/Pumps: AROM;PROM;Both;10 reps;Supine Long Arc Quad: AROM;Left;10 reps;Seated Heel Slides: 10 reps;Supine;Left;AAROM Straight Leg Raises: AROM;Both;5 reps;Supine   Assessment/Plan    PT Assessment Patient needs continued PT services  PT Problem List Decreased strength;Decreased range of motion;Decreased activity tolerance;Decreased balance;Decreased mobility;Decreased safety awareness;Decreased knowledge of use of DME       PT Treatment Interventions DME instruction;Functional mobility training;Therapeutic activities;Therapeutic exercise;Balance training;Neuromuscular re-education;Patient/family education;Wheelchair mobility training    PT Goals (Current goals can be found in the Care Plan section)  Acute Rehab PT Goals Patient Stated Goal: to return home PT Goal Formulation: With patient Time For Goal Achievement: 02/11/17 Potential to Achieve Goals: Fair    Frequency Min 2X/week   Barriers to discharge        Co-evaluation               AM-PAC PT "6 Clicks" Daily Activity  Outcome Measure Difficulty turning over in bed (including adjusting bedclothes, sheets and blankets)?: Total Difficulty moving from lying on back to sitting on the side of the bed? : Total Difficulty sitting down on and standing up from a chair with arms (e.g., wheelchair, bedside commode, etc,.)?: Total Help needed moving to and from a bed to chair (including a wheelchair)?: Total Help needed walking in hospital room?: Total Help needed climbing 3-5 steps with a railing? : Total 6 Click Score: 6    End of Session   Activity Tolerance: Patient limited by fatigue Patient left: in bed;with bed alarm set;with call bell/phone within reach Nurse Communication: Mobility  status;Need for lift equipment PT Visit Diagnosis: Muscle weakness (generalized) (M62.81)    Time: [id number] PT Time Calculation (min) (ACUTE ONLY): 26 min   Charges:   PT Evaluation $PT Eval Low Complexity: 1 Low PT Treatments $Therapeutic Exercise: 8-22 mins   PT G Codes:        Encarnacion ChuAshley Eric Morganti PT, DPT 01/28/2017, 1:12 PM

## 2017-01-28 NOTE — Progress Notes (Signed)
Report called to receiving nurse

## 2017-01-28 NOTE — Discharge Instructions (Signed)
Heart healthy and ADA diet. °

## 2017-01-28 NOTE — Care Management Important Message (Signed)
Important Message  Patient Details  Name: Alexandria Hanson MRN: [id number] Date of Birth: 11-27-1928   Medicare Important Message Given:  Yes    Eber HongGreene, [name] R, RN 01/28/2017, 12:01 PM

## 2017-01-28 NOTE — Progress Notes (Signed)
15 beats run of V-tach called on patient,primary MD and cardiologist notified.Patient is asymptomatic.

## 2017-01-28 NOTE — Care Management Important Message (Signed)
Important Message  Patient Details  Name: Alexandria Hanson MRN: [id number] Date of Birth: 11/08/1928   Medicare Important Message Given:  Yes There is not a singed IM that has been scanned into record present. Explained notice to daughter Alvan Dameancy Murphy who may not be on unit today.  She informed CM patient will be able to comprehend the information in the notice and nod yes/no but not able to sign.  Patient is also dysarthric Explained notice to patient and nodded yes when asked "do you feel like you are stable for discharge back to [facility]. she nodded "no" when asked if she felt like she needed to stay another night.  Eber HongGreene, Aleicia Kenagy R, RN 01/28/2017, 12:17 PM

## 2017-01-30 ENCOUNTER — Telehealth: Payer: Self-pay

## 2017-01-30 NOTE — Telephone Encounter (Signed)
Called [facility] to cancel appt set-up for 02/04/2017 per Phineas Inchesheryl Wicker,NP. Patient does not need to come in while at rehab center.

## 2017-01-30 NOTE — Telephone Encounter (Signed)
Please do not ask her to come if in rehab such as [facility].

## 2017-01-30 NOTE — Telephone Encounter (Signed)
Called to completed transition of care call for patient since discharged [facility]. Spoke with patients daughter. [facility]. Daughter is unsure if facility is aware of appt to see Phineas InchesCheryl Wicker,NP on 02/04/2017. Called and confirmed appt  with nursing home.

## 2017-02-02 ENCOUNTER — Telehealth: Payer: Self-pay | Admitting: Unknown Physician Specialty

## 2017-02-02 NOTE — Telephone Encounter (Signed)
OK 

## 2017-02-02 NOTE — Telephone Encounter (Signed)
Routing to provider. Patient last seen 10/2014.

## 2017-02-02 NOTE — Telephone Encounter (Signed)
FYIVikki Ports: Valerie from [facility] called to cancel patient's appointment due to patient being a new patient of facility and no longer needing two providers. Informed Vikki PortsValerie I will inform patient's pcp.

## 2017-02-04 ENCOUNTER — Inpatient Hospital Stay: Payer: Medicare Other | Admitting: Unknown Physician Specialty

## 2017-02-05 DIAGNOSIS — Z8673 Personal history of transient ischemic attack (TIA), and cerebral infarction without residual deficits: Secondary | ICD-10-CM | POA: Diagnosis not present

## 2017-02-05 DIAGNOSIS — I214 Non-ST elevation (NSTEMI) myocardial infarction: Secondary | ICD-10-CM | POA: Diagnosis not present

## 2017-02-05 DIAGNOSIS — I5020 Unspecified systolic (congestive) heart failure: Secondary | ICD-10-CM | POA: Diagnosis not present

## 2017-02-05 DIAGNOSIS — I10 Essential (primary) hypertension: Secondary | ICD-10-CM | POA: Diagnosis not present

## 2017-03-22 ENCOUNTER — Emergency Department: Payer: Medicare Other

## 2017-03-22 ENCOUNTER — Observation Stay
Admission: EM | Admit: 2017-03-22 | Discharge: 2017-03-23 | Disposition: A | Discharge status: Skilled Nursing Facility | Payer: Medicare Other | Attending: Specialist | Admitting: Specialist

## 2017-03-22 DIAGNOSIS — E782 Mixed hyperlipidemia: Secondary | ICD-10-CM | POA: Insufficient documentation

## 2017-03-22 DIAGNOSIS — I252 Old myocardial infarction: Secondary | ICD-10-CM | POA: Insufficient documentation

## 2017-03-22 DIAGNOSIS — R0789 Other chest pain: Secondary | ICD-10-CM | POA: Diagnosis not present

## 2017-03-22 DIAGNOSIS — Z791 Long term (current) use of non-steroidal anti-inflammatories (NSAID): Secondary | ICD-10-CM | POA: Diagnosis not present

## 2017-03-22 DIAGNOSIS — R7989 Other specified abnormal findings of blood chemistry: Secondary | ICD-10-CM | POA: Diagnosis not present

## 2017-03-22 DIAGNOSIS — R079 Chest pain, unspecified: Secondary | ICD-10-CM | POA: Diagnosis not present

## 2017-03-22 DIAGNOSIS — F329 Major depressive disorder, single episode, unspecified: Secondary | ICD-10-CM | POA: Diagnosis not present

## 2017-03-22 DIAGNOSIS — Z7902 Long term (current) use of antithrombotics/antiplatelets: Secondary | ICD-10-CM | POA: Insufficient documentation

## 2017-03-22 DIAGNOSIS — K219 Gastro-esophageal reflux disease without esophagitis: Secondary | ICD-10-CM | POA: Diagnosis not present

## 2017-03-22 DIAGNOSIS — I2510 Atherosclerotic heart disease of native coronary artery without angina pectoris: Secondary | ICD-10-CM

## 2017-03-22 DIAGNOSIS — F419 Anxiety disorder, unspecified: Secondary | ICD-10-CM | POA: Diagnosis not present

## 2017-03-22 DIAGNOSIS — I69320 Aphasia following cerebral infarction: Secondary | ICD-10-CM | POA: Insufficient documentation

## 2017-03-22 DIAGNOSIS — F0390 Unspecified dementia without behavioral disturbance: Secondary | ICD-10-CM | POA: Diagnosis not present

## 2017-03-22 DIAGNOSIS — R778 Other specified abnormalities of plasma proteins: Secondary | ICD-10-CM

## 2017-03-22 DIAGNOSIS — R748 Abnormal levels of other serum enzymes: Secondary | ICD-10-CM | POA: Diagnosis not present

## 2017-03-22 DIAGNOSIS — I10 Essential (primary) hypertension: Secondary | ICD-10-CM | POA: Diagnosis not present

## 2017-03-22 DIAGNOSIS — Z7982 Long term (current) use of aspirin: Secondary | ICD-10-CM | POA: Insufficient documentation

## 2017-03-22 DIAGNOSIS — Z79899 Other long term (current) drug therapy: Secondary | ICD-10-CM | POA: Diagnosis not present

## 2017-03-22 DIAGNOSIS — I69351 Hemiplegia and hemiparesis following cerebral infarction affecting right dominant side: Secondary | ICD-10-CM | POA: Insufficient documentation

## 2017-03-22 DIAGNOSIS — I214 Non-ST elevation (NSTEMI) myocardial infarction: Secondary | ICD-10-CM | POA: Diagnosis not present

## 2017-03-22 DIAGNOSIS — Z66 Do not resuscitate: Secondary | ICD-10-CM | POA: Insufficient documentation

## 2017-03-22 LAB — TROPONIN I
TROPONIN I: 0.1 ng/mL — AB (ref <0.03)
Troponin I: 0.04 ng/mL (ref <0.03)
Troponin I: 0.08 ng/mL (ref <0.03)
Troponin I: 0.07 ng/mL (ref <0.03)

## 2017-03-22 LAB — CBC
HEMATOCRIT: 34.7 % — AB (ref 35.0–47.0)
Hemoglobin: 12.3 g/dL (ref 12.0–16.0)
MCH: 31.6 pg (ref 26.0–34.0)
MCHC: 35.5 g/dL (ref 32.0–36.0)
MCV: 88.9 fL (ref 80.0–100.0)
Platelets: 285 10*3/uL (ref 150–440)
RBC: 3.9 MIL/uL (ref 3.80–5.20)
RDW: 13.2 % (ref 11.5–14.5)
WBC: 7.9 10*3/uL (ref 3.6–11.0)

## 2017-03-22 LAB — BASIC METABOLIC PANEL
Anion gap: 12 (ref 5–15)
BUN: 14 mg/dL (ref 6–20)
CHLORIDE: 97 mmol/L — AB (ref 101–111)
CO2: 24 mmol/L (ref 22–32)
Calcium: 9.6 mg/dL (ref 8.9–10.3)
Creatinine, Ser: 0.7 mg/dL (ref 0.44–1.00)
GFR calc non Af Amer: >60 mL/min (ref >60)
Glucose, Bld: 136 mg/dL — ABNORMAL HIGH (ref 65–99)
POTASSIUM: 3.9 mmol/L (ref 3.5–5.1)
SODIUM: 133 mmol/L — AB (ref 135–145)

## 2017-03-22 LAB — MRSA PCR SCREENING: MRSA by PCR: NEGATIVE

## 2017-03-22 MED ORDER — FLUOXETINE HCL 10 MG PO CAPS
10.0000 mg | ORAL_CAPSULE | Freq: Every day | ORAL | Status: DC
  Administered 2017-03-23: 10 mg via ORAL
  Filled 2017-03-22 (×2): qty 1

## 2017-03-22 MED ORDER — ONDANSETRON HCL 4 MG/2ML IJ SOLN: 4.0000 mg | Freq: Four times a day (QID) | INTRAMUSCULAR | Status: DC | PRN

## 2017-03-22 MED ORDER — METOPROLOL TARTRATE 25 MG PO TABS
25.0000 mg | ORAL_TABLET | Freq: Two times a day (BID) | ORAL | Status: DC
  Administered 2017-03-22 – 2017-03-23 (×2): 25 mg via ORAL
  Filled 2017-03-22 (×2): qty 1

## 2017-03-22 MED ORDER — HEPARIN BOLUS VIA INFUSION
3500.0000 [IU] | Freq: Once | INTRAVENOUS | Status: AC
  Administered 2017-03-22: 3500 [IU] via INTRAVENOUS
  Filled 2017-03-22: qty 3500

## 2017-03-22 MED ORDER — ATORVASTATIN CALCIUM 20 MG PO TABS
40.0000 mg | ORAL_TABLET | Freq: Every day | ORAL | Status: DC
  Administered 2017-03-22: 40 mg via ORAL
  Filled 2017-03-22: qty 2

## 2017-03-22 MED ORDER — HEPARIN (PORCINE) IN NACL 100-0.45 UNIT/ML-% IJ SOLN
12.0000 [IU]/kg/h | INTRAMUSCULAR | Status: DC
  Administered 2017-03-22: 12 [IU]/kg/h via INTRAVENOUS
  Filled 2017-03-22: qty 250

## 2017-03-22 MED ORDER — ACETAMINOPHEN 650 MG RE SUPP: 650.0000 mg | Freq: Four times a day (QID) | RECTAL | Status: DC | PRN

## 2017-03-22 MED ORDER — HEPARIN (PORCINE) IN NACL 100-0.45 UNIT/ML-% IJ SOLN
12.0000 [IU]/kg/h | INTRAMUSCULAR | Status: DC
  Filled 2017-03-22: qty 250

## 2017-03-22 MED ORDER — ENOXAPARIN SODIUM 40 MG/0.4ML ~~LOC~~ SOLN: 40.0000 mg | SUBCUTANEOUS | Status: DC

## 2017-03-22 MED ORDER — DOCUSATE SODIUM 100 MG PO CAPS
100.0000 mg | ORAL_CAPSULE | Freq: Two times a day (BID) | ORAL | Status: DC
  Administered 2017-03-22 – 2017-03-23 (×2): 100 mg via ORAL
  Filled 2017-03-22 (×2): qty 1

## 2017-03-22 MED ORDER — CLOPIDOGREL BISULFATE 75 MG PO TABS
75.0000 mg | ORAL_TABLET | Freq: Every day | ORAL | Status: DC
  Administered 2017-03-22 – 2017-03-23 (×2): 75 mg via ORAL
  Filled 2017-03-22 (×2): qty 1

## 2017-03-22 MED ORDER — ONDANSETRON HCL 4 MG/2ML IJ SOLN
4.0000 mg | Freq: Once | INTRAMUSCULAR | Status: AC
  Administered 2017-03-22: 4 mg via INTRAVENOUS
  Filled 2017-03-22: qty 2

## 2017-03-22 MED ORDER — NITROGLYCERIN 2 % TD OINT
1.0000 [in_us] | TOPICAL_OINTMENT | Freq: Once | TRANSDERMAL | Status: AC
  Administered 2017-03-22: 1 [in_us] via TOPICAL
  Filled 2017-03-22: qty 1

## 2017-03-22 MED ORDER — ASPIRIN 81 MG PO CHEW
81.0000 mg | CHEWABLE_TABLET | Freq: Every day | ORAL | Status: DC
  Administered 2017-03-23: 81 mg via ORAL
  Filled 2017-03-22: qty 1

## 2017-03-22 MED ORDER — MORPHINE SULFATE (PF) 2 MG/ML IV SOLN
2.0000 mg | INTRAVENOUS | Status: DC | PRN
  Administered 2017-03-23: 2 mg via INTRAVENOUS
  Filled 2017-03-22: qty 1

## 2017-03-22 MED ORDER — NITROGLYCERIN 2 % TD OINT
1.0000 [in_us] | TOPICAL_OINTMENT | Freq: Four times a day (QID) | TRANSDERMAL | Status: DC
  Administered 2017-03-22 – 2017-03-23 (×5): 1 [in_us] via TOPICAL
  Filled 2017-03-22 (×4): qty 1

## 2017-03-22 MED ORDER — LISINOPRIL 5 MG PO TABS
5.0000 mg | ORAL_TABLET | Freq: Every day | ORAL | Status: DC
Start: 2017-03-22 — End: 2017-03-23
  Administered 2017-03-22 – 2017-03-23 (×2): 5 mg via ORAL
  Filled 2017-03-22 (×2): qty 1

## 2017-03-22 MED ORDER — PANTOPRAZOLE SODIUM 40 MG PO TBEC
40.0000 mg | DELAYED_RELEASE_TABLET | Freq: Every day | ORAL | Status: DC
  Administered 2017-03-22 – 2017-03-23 (×2): 40 mg via ORAL
  Filled 2017-03-22 (×2): qty 1

## 2017-03-22 MED ORDER — HEPARIN BOLUS VIA INFUSION
3500.0000 [IU] | Freq: Once | INTRAVENOUS | Status: DC
  Filled 2017-03-22: qty 3500

## 2017-03-22 MED ORDER — ASPIRIN 81 MG PO CHEW
324.0000 mg | CHEWABLE_TABLET | Freq: Once | ORAL | Status: AC
  Administered 2017-03-22: 324 mg via ORAL
  Filled 2017-03-22: qty 4

## 2017-03-22 MED ORDER — ADULT MULTIVITAMIN W/MINERALS CH
1.0000 | ORAL_TABLET | Freq: Every day | ORAL | Status: DC
  Administered 2017-03-22 – 2017-03-23 (×2): 1 via ORAL
  Filled 2017-03-22 (×2): qty 1

## 2017-03-22 MED ORDER — BISACODYL 10 MG RE SUPP: 10.0000 mg | Freq: Every day | RECTAL | Status: DC | PRN

## 2017-03-22 MED ORDER — ONDANSETRON HCL 4 MG PO TABS: 4.0000 mg | ORAL_TABLET | Freq: Four times a day (QID) | ORAL | Status: DC | PRN

## 2017-03-22 MED ORDER — MORPHINE SULFATE (PF) 2 MG/ML IV SOLN
2.0000 mg | Freq: Once | INTRAVENOUS | Status: DC | PRN
Start: 2017-03-22 — End: 2017-03-23

## 2017-03-22 MED ORDER — SODIUM CHLORIDE 0.9 % IV SOLN
INTRAVENOUS | Status: DC
  Administered 2017-03-22 – 2017-03-23 (×2): via INTRAVENOUS

## 2017-03-22 MED ORDER — NITROGLYCERIN 0.4 MG SL SUBL: 0.4000 mg | SUBLINGUAL_TABLET | SUBLINGUAL | Status: DC | PRN

## 2017-03-22 MED ORDER — METOCLOPRAMIDE HCL 10 MG PO TABS
5.0000 mg | ORAL_TABLET | Freq: Every evening | ORAL | Status: DC
  Administered 2017-03-22: 5 mg via ORAL
  Filled 2017-03-22: qty 1

## 2017-03-22 MED ORDER — HYDROCHLOROTHIAZIDE 25 MG PO TABS
25.0000 mg | ORAL_TABLET | Freq: Every day | ORAL | Status: DC
Start: 2017-03-22 — End: 2017-03-23
  Administered 2017-03-22 – 2017-03-23 (×2): 25 mg via ORAL
  Filled 2017-03-22 (×2): qty 1

## 2017-03-22 MED ORDER — ACETAMINOPHEN 325 MG PO TABS: 650.0000 mg | ORAL_TABLET | Freq: Four times a day (QID) | ORAL | Status: DC | PRN

## 2017-03-22 NOTE — H&P (Signed)
History and Physical    Alexandria Hanson:[id number] DOB: 1928-10-11 DOA: 03/22/2017  Referring physician: Dr. Shaune Pollack PCP: Gabriel Cirri, NP  Specialists: Dr. Darrold Junker  Chief Complaint: Chest pain  HPI: Alexandria Hanson is a 83 y.o. female has a past medical history significant for HTN, CVA, anxiety, and CAD s/p recent MI now with recurrent CP at rest and mildly elevated troponin. Currently pain-free. Denies SOB. EKG non-acute. She is now admitted. No fever. Denies N/V/D.  Review of Systems: The patient denies anorexia, fever, weight loss,, vision loss, decreased hearing, hoarseness,  syncope, dyspnea on exertion, peripheral edema, balance deficits, hemoptysis, abdominal pain, melena, hematochezia, severe indigestion/heartburn, hematuria, incontinence, genital sores, muscle weakness, suspicious skin lesions, transient blindness, difficulty walking, depression, unusual weight change, abnormal bleeding, enlarged lymph nodes, angioedema, and breast masses.   Past Medical History:  Diagnosis Date  . Anxiety   . Bleeding in brain [facility])    s/p fall  . CAD (coronary artery disease)    s/p MI 30 yrs ago  . Cerebral artery occlusion   . Depression   . Hypertension   . MI (mitral incompetence)   . Stroke (HCC) 2001  . Stroke [facility])    Past Surgical History:  Procedure Laterality Date  . ABDOMINAL HYSTERECTOMY    . APPENDECTOMY    . CHOLECYSTECTOMY    . COLON SURGERY     knot on colon removed   Social History:  reports that she has never smoked. She has never used smokeless tobacco. She reports that she does not drink alcohol or use drugs.  No Known Allergies  Family History  Problem Relation Age of Onset  . CVA Mother   . CAD Father   . CAD Sister   . Hyperlipidemia Mother   . Hypertension Mother   . Diabetes Sister   . Hypertension Sister     Prior to Admission medications   Medication Sig Start Date End Date Taking? Authorizing Provider  acetaminophen (TYLENOL) 325 MG tablet  Take 650 mg by mouth every 4 (four) hours as needed for mild pain or moderate pain.   Yes [provider]  Alum & Mag Hydroxide-Simeth (GI COCKTAIL) SUSP suspension Take 30 mLs by mouth 3 (three) times daily as needed for indigestion. Shake well. 01/28/17  Yes Shaune Pollack, MD  aspirin 81 MG chewable tablet Chew 81 mg by mouth daily.   Yes [provider]  atorvastatin (LIPITOR) 40 MG tablet Take 1 tablet (40 mg total) by mouth daily at 6 PM. 03/11/16  Yes Auburn Bilberry, MD  clopidogrel (PLAVIX) 75 MG tablet Take 1 tablet (75 mg total) by mouth daily. 03/11/16  Yes Auburn Bilberry, MD  FLUoxetine (PROZAC) 10 MG capsule Take 10 mg by mouth daily.   Yes [provider]  guaifenesin (ROBITUSSIN) 100 MG/5ML syrup Take 100 mg by mouth every 4 (four) hours as needed for cough.   Yes [provider]  hydrochlorothiazide (HYDRODIURIL) 25 MG tablet Take 1 tablet (25 mg total) by mouth daily. 01/18/16  Yes Gabriel Cirri, NP  isosorbide mononitrate (IMDUR) 30 MG 24 hr tablet Take 30 mg by mouth daily.   Yes [provider]  lisinopril (PRINIVIL,ZESTRIL) 5 MG tablet Take 5 mg by mouth daily.   Yes [provider]  metoCLOPramide (REGLAN) 5 MG tablet Take 5 mg by mouth every evening.   Yes [provider]  metoprolol tartrate (LOPRESSOR) 25 MG tablet Take 1 tablet (25 mg total) by mouth 2 (two) times daily.  01/28/17  Yes Shaune Pollack, MD  Multiple Vitamin (MULTIVITAMIN) tablet Take 1 tablet by mouth daily.   Yes [provider]  nitroGLYCERIN (NITROLINGUAL) 0.4 MG/SPRAY spray Place 1 spray under the tongue every 5 (five) minutes x 3 doses as needed for chest pain.   Yes [provider]  pantoprazole (PROTONIX) 40 MG tablet Take 40 mg by mouth daily.   Yes [provider]   Physical Exam: Vitals:   03/22/17 1145 03/22/17 1200 03/22/17 1215 03/22/17 1230  BP:  (!) 147/77  (!) 146/80  Pulse: 79 77 77 76  Resp: (!) 22 (!) 22 17 (!)  33  Temp:      TempSrc:      SpO2: 91% 96% 95% 93%  Height:         General:  No apparent distress, WDWN, [place]/AT  Eyes: PERRL, EOMI, no scleral icterus, conjunctiva clear  ENT: moist oropharynx without exudate, TM's benign, dentition fair  Neck: supple, no lymphadenopathy. No bruits or thyromegaly  Cardiovascular: regular rate without MRG; 2+ peripheral pulses, no JVD, no peripheral edema  Respiratory: CTA biL, good air movement without wheezing, rhonchi or crackled. Respiratory effort normal  Abdomen: soft, non tender to palpation, positive bowel sounds, no guarding, no rebound  Skin: no rashes or lesions  Musculoskeletal: normal bulk and tone, no joint swelling  Psychiatric: normal mood and affect, alert, oriented to person and place only  Neurologic: CN 2-12 grossly intact, Motor strength 5/5 on left, 4/5 on right. DTR's symmetric. Sensory exam non-focal  Labs on Admission:  Basic Metabolic Panel:  Recent Labs Lab 03/22/17 0824  NA 133*  K 3.9  CL 97*  CO2 24  GLUCOSE 136*  BUN 14  CREATININE 0.70  CALCIUM 9.6   Liver Function Tests: No results for input(s): AST, ALT, ALKPHOS, BILITOT, PROT, ALBUMIN in the last 168 hours. No results for input(s): LIPASE, AMYLASE in the last 168 hours. No results for input(s): AMMONIA in the last 168 hours. CBC:  Recent Labs Lab 03/22/17 0824  WBC 7.9  HGB 12.3  HCT 34.7*  MCV 88.9  PLT 285   Cardiac Enzymes:  Recent Labs Lab 03/22/17 0824 03/22/17 1118  TROPONINI 0.04* 0.08*    BNP (last 3 results)  Recent Labs  01/26/17 0252  BNP 310.0*    ProBNP (last 3 results) No results for input(s): PROBNP in the last 8760 hours.  CBG: No results for input(s): GLUCAP in the last 168 hours.  Radiological Exams on Admission: Dg Chest Port 1 View  Result Date: 03/22/2017 CLINICAL DATA:  Central chest pain. EXAM: PORTABLE CHEST 1 VIEW COMPARISON:  January 26, 2017 FINDINGS: The cardiomediastinal silhouette is  stable with a torturous thoracic aorta. No pneumothorax. No pulmonary nodules, masses, or focal infiltrates. IMPRESSION: No active disease. Electronically Signed   By: Gerome Sam III M.D   On: 03/22/2017 08:41    EKG: Independently reviewed.  Assessment/Plan Principal Problem:   Chest pain Active Problems:   Anxiety   Elevated troponin   ASCVD (arteriosclerotic cardiovascular disease)   Will observe on telemetry with Lovenox and follow enzymes. Order echo and consult Cardiology. Begin NTP and continue other outpatient meds  Diet: soft Fluids: NS@75  DVT Prophylaxis: Lovenox  Code Status: FULL  Family Communication: none  Disposition Plan: SNF  Time spent: 50 min

## 2017-03-22 NOTE — Progress Notes (Signed)
ANTICOAGULATION CONSULT NOTE  Pharmacy Consult for Heparin  Indication: chest pain/ACS  No Known Allergies  Patient Measurements: Height:  (160 cm) IBW/kg (Calculated) : 52.4 Heparin Dosing Weight: 66.5 kg  Vital Signs: Temp: 98.4 F (36.9 C) (09/23 0826) Temp Source: Axillary (09/23 0826) BP: 146/80 (09/23 1230) Pulse Rate: 76 (09/23 1230)  Labs:  Recent Labs  03/22/17 0824 03/22/17 1118  HGB 12.3  --   HCT 34.7*  --   PLT 285  --   CREATININE 0.70  --   TROPONINI 0.04* 0.08*    CrCl cannot be calculated (Unknown ideal weight.).   Medical History: Past Medical History:  Diagnosis Date  . Anxiety   . Bleeding in brain [facility])    s/p fall  . CAD (coronary artery disease)    s/p MI 30 yrs ago  . Cerebral artery occlusion   . Depression   . Hypertension   . MI (mitral incompetence)   . Stroke (HCC) 2001  . Stroke [facility])     Assessment: 83 y/o F with a h/o CVA, MI, and brain bleed (s/p fall) admitted with CP.   Goal of Therapy:  Heparin level 0.3-0.7 units/ml Monitor platelets by anticoagulation protocol: Yes   Plan:  Give 3500 units bolus x 1 Start heparin infusion at 800 units/hr Check anti-Xa level in 8 hours and daily while on heparin Continue to monitor H&H and platelets  Luisa Hart D 03/22/2017,2:06 PM

## 2017-03-22 NOTE — ED Triage Notes (Addendum)
Per EMS pt comes from [place] and c/o of central chest pain.  Pt has a history of two Mis and a stroke.  Pt states belly and chest pain upon arrival.  Pt right side affected by previous stroke and expressive aphasia.

## 2017-03-22 NOTE — Consult Note (Signed)
[facility] Cardiology Consultation Note  Patient ID: Alexandria Hanson, MRN: [id number], DOB/AGE: Jan 12, 1929 83 y.o. Admit date: 03/22/2017   Date of Consult: 03/22/2017 Primary Physician: Gabriel Cirri, NP Primary Cardiologist:None  Chief Complaint:  Chief Complaint  Patient presents with  . Chest Pain   Reason for Consult: chest pain  HPI: 83 y.o. female with known coronary artery disease status post previous myocardial infarction many years ago as well as recent non-ST elevation myocardial infarction and an hospitalization last month. The patient has essential hypertension mixed hyperlipidemia and previous stroke. She does have dementia and or significant issues with previous stroke for which the patient has had not had any improvements and is in a nursing home. Previously she has had some chest discomfort substernal radiating into her back and other arms with shortness of breath which ended up being a non-ST elevation myocardial infarction. At this time she's had recurrent symptoms very similar to that in the past although troponin level is 0.07. The patient has been on appropriate medication management for previous myocardial infarction including dual antiplatelet therapy in high intensity cholesterol therapy as well as ACE inhibitor metoprolol and nitrates. This has worked relatively well although this new chest pain may be due to atypical pain versus new non-ST elevation myocardial infarction. Currently the patient does not have any chest discomfort shortness of breath or heart failure type symptoms  Past Medical History:  Diagnosis Date  . Anxiety   . Bleeding in brain [facility])    s/p fall  . CAD (coronary artery disease)    s/p MI 30 yrs ago  . Cerebral artery occlusion   . Depression   . Hypertension   . MI (mitral incompetence)   . Stroke (HCC) 2001  . Stroke [facility])       Surgical History:  Past Surgical History:  Procedure Laterality Date  . ABDOMINAL HYSTERECTOMY    .  APPENDECTOMY    . CHOLECYSTECTOMY    . COLON SURGERY     knot on colon removed     Home Meds: Prior to Admission medications   Medication Sig Start Date End Date Taking? Authorizing Provider  acetaminophen (TYLENOL) 325 MG tablet Take 650 mg by mouth every 4 (four) hours as needed for mild pain or moderate pain.   Yes [provider]  Alum & Mag Hydroxide-Simeth (GI COCKTAIL) SUSP suspension Take 30 mLs by mouth 3 (three) times daily as needed for indigestion. Shake well. 01/28/17  Yes Shaune Pollack, MD  aspirin 81 MG chewable tablet Chew 81 mg by mouth daily.   Yes [provider]  atorvastatin (LIPITOR) 40 MG tablet Take 1 tablet (40 mg total) by mouth daily at 6 PM. 03/11/16  Yes Auburn Bilberry, MD  clopidogrel (PLAVIX) 75 MG tablet Take 1 tablet (75 mg total) by mouth daily. 03/11/16  Yes Auburn Bilberry, MD  FLUoxetine (PROZAC) 10 MG capsule Take 10 mg by mouth daily.   Yes [provider]  guaifenesin (ROBITUSSIN) 100 MG/5ML syrup Take 100 mg by mouth every 4 (four) hours as needed for cough.   Yes [provider]  hydrochlorothiazide (HYDRODIURIL) 25 MG tablet Take 1 tablet (25 mg total) by mouth daily. 01/18/16  Yes Gabriel Cirri, NP  isosorbide mononitrate (IMDUR) 30 MG 24 hr tablet Take 30 mg by mouth daily.   Yes [provider]  lisinopril (PRINIVIL,ZESTRIL) 5 MG tablet Take 5 mg by mouth daily.   Yes [provider]  metoCLOPramide (REGLAN) 5 MG tablet Take  5 mg by mouth every evening.   Yes [provider]  metoprolol tartrate (LOPRESSOR) 25 MG tablet Take 1 tablet (25 mg total) by mouth 2 (two) times daily. 01/28/17  Yes Shaune Pollack, MD  Multiple Vitamin (MULTIVITAMIN) tablet Take 1 tablet by mouth daily.   Yes [provider]  nitroGLYCERIN (NITROLINGUAL) 0.4 MG/SPRAY spray Place 1 spray under the tongue every 5 (five) minutes x 3 doses as needed for chest pain.   Yes [provider]  pantoprazole  (PROTONIX) 40 MG tablet Take 40 mg by mouth daily.   Yes [provider]    Inpatient Medications:  . aspirin  81 mg Oral Daily  . atorvastatin  40 mg Oral q1800  . clopidogrel  75 mg Oral Daily  . docusate sodium  100 mg Oral BID  . enoxaparin (LOVENOX) injection  40 mg Subcutaneous Q24H  . FLUoxetine  10 mg Oral Daily  . hydrochlorothiazide  25 mg Oral Daily  . lisinopril  5 mg Oral Daily  . metoCLOPramide  5 mg Oral QPM  . metoprolol tartrate  25 mg Oral BID  . multivitamin  1 tablet Oral Daily  . nitroGLYCERIN  1 inch Topical Q6H  . pantoprazole  40 mg Oral Daily   . sodium chloride      Allergies: No Known Allergies  Social History   Social History  . Marital status: Widowed    Spouse name: N/A  . Number of children: N/A  . Years of education: N/A   Occupational History  . Not on file.   Social History Main Topics  . Smoking status: Never Smoker  . Smokeless tobacco: Never Used  . Alcohol use No  . Drug use: No  . Sexual activity: Not on file   Other Topics Concern  . Not on file   Social History Narrative   ** Merged History Encounter **         Family History  Problem Relation Age of Onset  . CVA Mother   . CAD Father   . CAD Sister   . Hyperlipidemia Mother   . Hypertension Mother   . Diabetes Sister   . Hypertension Sister      Review of Systems  Cannot assess at this time Labs:  Recent Labs  03/22/17 0824 03/22/17 1118  TROPONINI 0.04* 0.08*   Lab Results  Component Value Date   WBC 7.9 03/22/2017   HGB 12.3 03/22/2017   HCT 34.7 (L) 03/22/2017   MCV 88.9 03/22/2017   PLT 285 03/22/2017    Recent Labs Lab 03/22/17 0824  NA 133*  K 3.9  CL 97*  CO2 24  BUN 14  CREATININE 0.70  CALCIUM 9.6  GLUCOSE 136*   No results found for: CHOL, HDL, LDLCALC, TRIG No results found for: DDIMER  Radiology/Studies:  Dg Chest Port 1 View  Result Date: 03/22/2017 CLINICAL DATA:  Central chest pain. EXAM: PORTABLE CHEST 1  VIEW COMPARISON:  January 26, 2017 FINDINGS: The cardiomediastinal silhouette is stable with a torturous thoracic aorta. No pneumothorax. No pulmonary nodules, masses, or focal infiltrates. IMPRESSION: No active disease. Electronically Signed   By: Gerome Sam III M.D   On: 03/22/2017 08:41    ZOX:WRUEAV sinus rhythm with left axis deviation and right bundle-branch block  Weights: There were no vitals filed for this visit.   Physical Exam: Blood pressure (!) 147/68, pulse 81, temperature 98.4 F (36.9 C), temperature source Axillary, resp. rate (!) 33, height 5'  3" (1.6 m), SpO2 94 %. There is no height or weight on file to calculate BMI. General: Well developed, well nourished, in no acute distress. Head eyes ears nose throat: Normocephalic, atraumatic, sclera non-icteric, no xanthomas, nares are without discharge. No apparent thyromegaly and/or mass  Lungs: Normal respiratory effort.  no wheezes, no rales, no rhonchi.  Heart: RRR with normal S1 S2. no murmur gallop, no rub, PMI is normal size and placement, carotid upstroke normal without bruit, jugular venous pressure is normal Abdomen: Soft, non-tender, non-distended with normoactive bowel sounds. No hepatomegaly. No rebound/guarding. No obvious abdominal masses. Abdominal aorta is normal size without bruit Extremities: Trace edema. no cyanosis, no clubbing, no ulcers  Peripheral : 2+ bilateral upper extremity pulses, 2+ bilateral femoral pulses, 2+ bilateral dorsal pedal pulse Neuro: Alert and not oriented. No facial asymmetry. No focal deficit. Moves all extremities spontaneously. Musculoskeletal: Normal muscle tone without kyphosis Psych:  Does not Responds to questions appropriately with a normal affect.    Assessment: 83 year old female with previous cerebrovascular accident with significant disabilities essential hypertension mixed hyperlipidemia coronary artery disease status post previous myocardial infarction with chest pain  and elevated troponin consistent with minimal non-ST elevation myocardial infarction without evidence of congestive heart failure  Plan: 1. Begin heparin intravenously for non-ST elevation myocardial infarction for 24-48 hours watching a troponin levels and possible further treatment options 2. Continuation of dual antiplatelet therapy for previous myocardial infarction 3. Continue metoprolol and ACE inhibitor combination for previous myocardial infarction and hypertension control 4. Nitrates for chest pain 5. Further cardiac diagnostics necessary at this time due to the significant disabilities not requiring invasive procedures and previous echocardiogram at last admission 6. Begin ambulation and other assessment tomorrow and possible discharge back to nursing home if no further symptoms have occurred  Signed, Lamar Blinks M.D. [facility] Cardiology 03/22/2017, 3:35 PM

## 2017-03-22 NOTE — ED Provider Notes (Signed)
[facility] Emergency Department Provider Note ____________________________________________   I have reviewed the triage vital signs and the triage nursing note.  HISTORY  Chief Complaint Chest Pain   Historian Patient, abdomen poor historian due to underlying stroke/aphasia issues, but is able to private most of the history  HPI Alexandria Hanson is a 83 y.o. female respiratory nursing home with a complaint of central chest pain. She points to the center of her chest. She states it's also a bit in her abdomen. Shakes her head no to indigestion. Denies coughing or trouble breathing. States that the chest pain is moderate. Denies fevers.  Does not sound like the pain in the central chest radiates at all.  History of positive for MI as well as stroke. Denies any new strokelike symptoms. She has chronic right-sided deficits as well as aphasia from the prior stroke.  Medication review appears the patient is on 81 mg aspirin as well as Plavix.    Past Medical History:  Diagnosis Date  . Anxiety   . Bleeding in brain [facility])    s/p fall  . CAD (coronary artery disease)    s/p MI 30 yrs ago  . Cerebral artery occlusion   . Depression   . Hypertension   . MI (mitral incompetence)   . Stroke (HCC) 2001  . Stroke [facility])     Patient Active Problem List   Diagnosis Date Noted  . Elevated troponin 03/22/2017  . ASCVD (arteriosclerotic cardiovascular disease) 03/22/2017  . Chest pain 01/26/2017  . NSTEMI (non-ST elevated myocardial infarction) (HCC) 01/26/2017  . Stroke (HCC) 03/08/2016  . Depression   . Anxiety   . Hypertension     Past Surgical History:  Procedure Laterality Date  . ABDOMINAL HYSTERECTOMY    . APPENDECTOMY    . CHOLECYSTECTOMY    . COLON SURGERY     knot on colon removed    Prior to Admission medications   Medication Sig Start Date End Date Taking? Authorizing Provider  acetaminophen (TYLENOL) 325 MG tablet Take 650 mg by mouth every  4 (four) hours as needed for mild pain or moderate pain.   Yes [provider]  Alum & Mag Hydroxide-Simeth (GI COCKTAIL) SUSP suspension Take 30 mLs by mouth 3 (three) times daily as needed for indigestion. Shake well. 01/28/17  Yes Shaune Pollack, MD  aspirin 81 MG chewable tablet Chew 81 mg by mouth daily.   Yes [provider]  atorvastatin (LIPITOR) 40 MG tablet Take 1 tablet (40 mg total) by mouth daily at 6 PM. 03/11/16  Yes Auburn Bilberry, MD  clopidogrel (PLAVIX) 75 MG tablet Take 1 tablet (75 mg total) by mouth daily. 03/11/16  Yes Auburn Bilberry, MD  FLUoxetine (PROZAC) 10 MG capsule Take 10 mg by mouth daily.   Yes [provider]  guaifenesin (ROBITUSSIN) 100 MG/5ML syrup Take 100 mg by mouth every 4 (four) hours as needed for cough.   Yes [provider]  hydrochlorothiazide (HYDRODIURIL) 25 MG tablet Take 1 tablet (25 mg total) by mouth daily. 01/18/16  Yes Gabriel Cirri, NP  isosorbide mononitrate (IMDUR) 30 MG 24 hr tablet Take 30 mg by mouth daily.   Yes [provider]  lisinopril (PRINIVIL,ZESTRIL) 5 MG tablet Take 5 mg by mouth daily.   Yes [provider]  metoCLOPramide (REGLAN) 5 MG tablet Take 5 mg by mouth every evening.   Yes [provider]  metoprolol tartrate (LOPRESSOR) 25 MG tablet Take 1 tablet (25 mg  total) by mouth 2 (two) times daily. 01/28/17  Yes Shaune Pollack, MD  Multiple Vitamin (MULTIVITAMIN) tablet Take 1 tablet by mouth daily.   Yes [provider]  nitroGLYCERIN (NITROLINGUAL) 0.4 MG/SPRAY spray Place 1 spray under the tongue every 5 (five) minutes x 3 doses as needed for chest pain.   Yes [provider]  pantoprazole (PROTONIX) 40 MG tablet Take 40 mg by mouth daily.   Yes [provider]    No Known Allergies  Family History  Problem Relation Age of Onset  . CVA Mother   . CAD Father   . CAD Sister   . Hyperlipidemia Mother   . Hypertension Mother   . Diabetes  Sister   . Hypertension Sister     Social History Social History  Substance Use Topics  . Smoking status: Never Smoker  . Smokeless tobacco: Never Used  . Alcohol use No    Review of Systems  Constitutional: Negative for fever. Eyes: Negative for visual changes. ENT: Negative for sore throat. Cardiovascular: positive for chest pain. Respiratory: Negative for shortness of breath. Gastrointestinal: Negative for abdominal pain, vomiting and diarrhea. Genitourinary: Negative for dysuria. Musculoskeletal: Negative for back pain. Skin: Negative for rash. Neurological: Negative for headache.  ____________________________________________   PHYSICAL EXAM:  VITAL SIGNS: ED Triage Vitals  Enc Vitals Group     BP 03/22/17 0826 135/90     Pulse Rate 03/22/17 0822 86     Resp 03/22/17 0822 (!) 25     Temp 03/22/17 0826 98.4 F (36.9 C)     Temp Source 03/22/17 0826 Axillary     SpO2 03/22/17 0826 96 %     Weight --      Height 03/22/17 0822  (1.6 m)     Head Circumference --      Peak Flow --      Pain Score 03/22/17 0821 2     Pain Loc --      Pain Edu? --      Excl. in GC? --      Constitutional: Alert and cooperative. Well appearing and in no distress. HEENT   Head: Normocephalic and atraumatic.      Eyes: Conjunctivae are normal. Pupils equal and round.       Ears:         Nose: No congestion/rhinnorhea.   Mouth/Throat: Mucous membranes are mildly dry.   Neck: No stridor. Cardiovascular/Chest: Normal rate, regular rhythm.  No murmurs, rubs, or gallops. Respiratory: Normal respiratory effort without tachypnea nor retractions. Breath sounds are clear and equal bilaterally. No wheezes/rales/rhonchi. Gastrointestinal: Soft. No distention, no guarding, no rebound.  mild tenderness in epigastrium.  Genitourinary/rectal:Deferred Musculoskeletal: Nontender with normal range of motion in all extremities. No joint effusions.  No lower extremity tenderness.  No  edema. Neurologic:  right-sided facial droop as well as right arm and leg weakness. She does have some aphasia. Skin:  Skin is warm, dry and intact. No rash noted. Psychiatric: Mood and affect are normal. Speech and behavior are normal. Patient exhibits appropriate insight and judgment.   ____________________________________________  LABS (pertinent positives/negatives) I, Governor Rooks, MD the attending physician have reviewed the labs noted below.  Labs Reviewed  BASIC METABOLIC PANEL - Abnormal; Notable for the following:       Result Value   Sodium 133 (*)    Chloride 97 (*)    Glucose, Bld 136 (*)    All other components within normal limits  CBC -  Abnormal; Notable for the following:    HCT 34.7 (*)    All other components within normal limits  TROPONIN I - Abnormal; Notable for the following:    Troponin I 0.04 (*)    All other components within normal limits  TROPONIN I - Abnormal; Notable for the following:    Troponin I 0.08 (*)    All other components within normal limits    ____________________________________________    EKG I, Governor Rooks, MD, the attending physician have personally viewed and interpreted all ECGs.  80 bpm. Normal sinus rhythm. Left axis deviation. Nonspecific T waves which are flattened. ____________________________________________  RADIOLOGY All Xrays were viewed by me.  Imaging interpreted by Radiologist, and I, Governor Rooks, MD the attending physician have reviewed the radiologist interpretation noted below.  chest x-ray: No active disease. __________________________________________  PROCEDURES  Procedure(s) performed: None  Critical Care performed: None  ____________________________________________   ED COURSE / ASSESSMENT AND PLAN  Pertinent labs & imaging results that were available during my care of the patient were reviewed by me and considered in my medical decision making (see chart for details).   Ms. Runkel is  alert and interactive and although she is a bit of a difficult historian due to some underlying aphasia, she does multiple times to me the same history and answers.  pain is in the epigastrium/mid chest, without associated symptoms. EKG is nonspecific. Patient was given aspirin as well as nitroglycerin with minimal improvement. Chest x-ray is reassuring.  Initial troponin 0.04. We'll plan on a repeat.  I reviewed the history from the admission 01/26/17 when the patient had troponin 0.06, possibly similar symptoms and ended up with nSTEMI.  At that time she had a CT chest abd pelvis for the similar symptoms, and I think given similar symptoms I will defer repeat chest ct as PE, dissection, or aneuyrism    repeat troponin has doubled at 0.08. Patient had minimal elevation which turned into an STEMI a previous hospital station.  Discussed with cardiologist recommended hospital observation admission on heparin.  Dr. Judithann Sheen to admit.   DIFFERENTIAL DIAGNOSIS: Differential diagnosis includes, but is not limited to, ACS, aortic dissection, pulmonary embolism, cardiac tamponade, pneumothorax, pneumonia, pericarditis/myocarditis, GI-related causes including esophagitis/gastritis, and musculoskeletal chest wall pain.    CONSULTATIONS:  Dr. Gwen Pounds, cardiology, recommends heparinization and hospital observation for likely an STEMI.  Dr. Judithann Sheen, hospitalist for admission.   Patient / Family / Caregiver informed of clinical course, medical decision-making process, and agree with plan.  ___________________________________________   FINAL CLINICAL IMPRESSION(S) / ED DIAGNOSES   Final diagnoses:  Troponin level elevated  Nonspecific chest pain              Note: This dictation was prepared with Dragon dictation. Any transcriptional errors that result from this process are unintentional    Governor Rooks, MD 03/22/17 [phone]

## 2017-03-23 DIAGNOSIS — Z7401 Bed confinement status: Secondary | ICD-10-CM | POA: Diagnosis not present

## 2017-03-23 DIAGNOSIS — I2510 Atherosclerotic heart disease of native coronary artery without angina pectoris: Secondary | ICD-10-CM | POA: Diagnosis not present

## 2017-03-23 DIAGNOSIS — R079 Chest pain, unspecified: Secondary | ICD-10-CM | POA: Diagnosis not present

## 2017-03-23 DIAGNOSIS — R748 Abnormal levels of other serum enzymes: Secondary | ICD-10-CM | POA: Diagnosis not present

## 2017-03-23 LAB — COMPREHENSIVE METABOLIC PANEL
ALBUMIN: 3.7 g/dL (ref 3.5–5.0)
ALK PHOS: 61 U/L (ref 38–126)
ALT: 25 U/L (ref 14–54)
ANION GAP: 7 (ref 5–15)
AST: 44 U/L — AB (ref 15–41)
BUN: 14 mg/dL (ref 6–20)
CALCIUM: 9.1 mg/dL (ref 8.9–10.3)
CO2: 27 mmol/L (ref 22–32)
CREATININE: 0.72 mg/dL (ref 0.44–1.00)
Chloride: 103 mmol/L (ref 101–111)
GFR calc Af Amer: >60 mL/min (ref >60)
GFR calc non Af Amer: >60 mL/min (ref >60)
GLUCOSE: 117 mg/dL — AB (ref 65–99)
Potassium: 4.2 mmol/L (ref 3.5–5.1)
SODIUM: 137 mmol/L (ref 135–145)
Total Bilirubin: 1.6 mg/dL — ABNORMAL HIGH (ref 0.3–1.2)
Total Protein: 6.7 g/dL (ref 6.5–8.1)

## 2017-03-23 LAB — CBC
HCT: 34.4 % — ABNORMAL LOW (ref 35.0–47.0)
HEMOGLOBIN: 12.2 g/dL (ref 12.0–16.0)
MCH: 32.1 pg (ref 26.0–34.0)
MCHC: 35.4 g/dL (ref 32.0–36.0)
MCV: 90.7 fL (ref 80.0–100.0)
Platelets: 246 10*3/uL (ref 150–440)
RBC: 3.8 MIL/uL (ref 3.80–5.20)
RDW: 13.5 % (ref 11.5–14.5)
WBC: 7.7 10*3/uL (ref 3.6–11.0)

## 2017-03-23 LAB — TROPONIN I: Troponin I: 0.06 ng/mL (ref <0.03)

## 2017-03-23 LAB — HEPARIN LEVEL (UNFRACTIONATED): HEPARIN UNFRACTIONATED: 0.43 [IU]/mL (ref 0.30–0.70)

## 2017-03-23 NOTE — Clinical Social Work Note (Signed)
Patient to be d/c'ed today to [facility].  Patient and family agreeable to plans will transport via ems RN to call report.  Windell Moulding, MSW, Theresia Majors [phone]

## 2017-03-23 NOTE — Clinical Social Work Note (Signed)
Clinical Social Work Assessment  Patient Details  Name: Alexandria Hanson MRN: [id number] Date of Birth: 1928/07/13  Date of referral:  03/23/17               Reason for consult:  Facility Placement                Permission sought to share information with:  Family Supports Permission granted to share information::  Yes, Release of Information Signed  Name::     Alexandria Hanson Daughter [phone]  [phone]  Agency::  SNF admissions  Relationship::     Contact Information:     Housing/Transportation Living arrangements for the past 2 months:  Skilled Nursing Facility Source of Information:  Adult Children, Medical Team Patient Interpreter Needed:  None Criminal Activity/Legal Involvement Pertinent to Current Situation/Hospitalization:  No - Comment as needed Significant Relationships:  Adult Children Lives with:    Do you feel safe going back to the place where you live?  Yes Need for family participation in patient care:  Yes (Comment) (Patient has some dementia.)  Care giving concerns:  Patient's family does not have any concerns about returning back to SNF.   Social Worker assessment / plan:  Patient is an 83 year old female who is a long term care resident at [facility].  Patient has dementia, assessment was completed by speaking to patient's daughter.  Patient's daughter reports that patient has been at facility for a few years.  Patient's daughter did not express any concerns about returning back to SNF.  Patient's daughter was explained role of CSW and process for having patient return back to SNF.  Patient's daughter gave CSW permission to contact SNF to complete paperwork needed for patient to return.  Patient's daughter did not have any other questions or concerns.   Employment status:  Retired Database administrator PT Recommendations:  Not assessed at this time Information / Referral to community resources:  Skilled Nursing  Facility  Patient/Family's Response to care:  Patient's family in agreement to returning back to SNF.  Patient/Family's Understanding of and Emotional Response to Diagnosis, Current Treatment, and Prognosis:  Patient's daughter is aware of patient's current prognosis and treatment plan.  Emotional Assessment Appearance:  Appears stated age Attitude/Demeanor/Rapport:    Affect (typically observed):  Appropriate, Calm Orientation:  Oriented to Self Alcohol / Substance use:  Not Applicable Psych involvement (Current and /or in the community):  No (Comment)  Discharge Needs  Concerns to be addressed:  Care Coordination Readmission within the last 30 days:  No Current discharge risk:  Cognitively Impaired Barriers to Discharge:  Continued Medical Work up   [place] Constable 03/23/2017, 5:07 PM

## 2017-03-23 NOTE — [place] FL2 (Signed)
[place] MEDICAID FL2 LEVEL OF CARE SCREENING TOOL     IDENTIFICATION  Patient Name: Alexandria Hanson Birthdate: 1928-08-29 Sex: female Admission Date (Current Location): 03/22/2017  [place] and [place] Number:  Chiropodist and Address:  [facility], [address] [place], [place] [address]      Provider Number: [id number]  Attending Physician Name and Address:  Houston Siren, MD  Relative Name and Phone Number:  Jennette Dubin    [phone] or Alita Chyle   [phone] or Murphy,Nancy Daughter [phone]  [phone] or Hulda Humphrey   [phone]     Current Level of Care: Hospital Recommended Level of Care: Skilled Nursing Facility Prior Approval Number:    Date Approved/Denied:   PASRR Number: [id number]  Discharge Plan: SNF    Current Diagnoses: Patient Active Problem List   Diagnosis Date Noted  . Elevated troponin 03/22/2017  . ASCVD (arteriosclerotic cardiovascular disease) 03/22/2017  . Chest pain 01/26/2017  . NSTEMI (non-ST elevated myocardial infarction) (HCC) 01/26/2017  . Stroke (HCC) 03/08/2016  . Depression   . Anxiety   . Hypertension     Orientation RESPIRATION BLADDER Height & Weight     Self  Normal Incontinent Weight: 153 lb 3.2 oz (69.5 kg) Height:   (160 cm)  BEHAVIORAL SYMPTOMS/MOOD NEUROLOGICAL BOWEL NUTRITION STATUS      Incontinent Diet (Soft Diet)  AMBULATORY STATUS COMMUNICATION OF NEEDS Skin   Limited Assist Verbally Normal                       Personal Care Assistance Level of Assistance  Bathing, Feeding, Dressing Bathing Assistance: Limited assistance Feeding assistance: Limited assistance Dressing Assistance: Limited assistance     Functional Limitations Info  Sight, Hearing, Speech Sight Info: Adequate Hearing Info: Adequate Speech Info: Adequate    SPECIAL CARE FACTORS FREQUENCY                       Contractures Contractures Info:  Not present    Additional Factors Info  Code Status, Allergies, Psychotropic Code Status Info: Full Code Allergies Info: NKA Psychotropic Info: FLUoxetine (PROZAC) capsule 10 mg         Current Medications (03/23/2017):  This is the current hospital active medication list Current Facility-Administered Medications  Medication Dose Route Frequency Provider Last Rate Last Dose  . 0.9 %  sodium chloride infusion   Intravenous Continuous Marguarite Arbour, MD   Stopped at 03/23/17 1142  . acetaminophen (TYLENOL) tablet 650 mg  650 mg Oral Q6H PRN Marguarite Arbour, MD       Or  . acetaminophen (TYLENOL) suppository 650 mg  650 mg Rectal Q6H PRN Marguarite Arbour, MD      . aspirin chewable tablet 81 mg  81 mg Oral Daily Marguarite Arbour, MD   81 mg at 03/23/17 0846  . atorvastatin (LIPITOR) tablet 40 mg  40 mg Oral q1800 Marguarite Arbour, MD   40 mg at 03/22/17 1749  . bisacodyl (DULCOLAX) suppository 10 mg  10 mg Rectal Daily PRN Marguarite Arbour, MD      . clopidogrel (PLAVIX) tablet 75 mg  75 mg Oral Daily Marguarite Arbour, MD   75 mg at 03/23/17 0846  . docusate sodium (COLACE) capsule 100 mg  100 mg Oral BID Marguarite Arbour, MD   100 mg at 03/23/17 0846  . FLUoxetine (PROZAC) capsule 10 mg  10 mg  Oral Daily Marguarite Arbour, MD   10 mg at 03/23/17 0849  . hydrochlorothiazide (HYDRODIURIL) tablet 25 mg  25 mg Oral Daily Marguarite Arbour, MD   25 mg at 03/23/17 0846  . lisinopril (PRINIVIL,ZESTRIL) tablet 5 mg  5 mg Oral Daily Marguarite Arbour, MD   5 mg at 03/23/17 0846  . metoCLOPramide (REGLAN) tablet 5 mg  5 mg Oral QPM Marguarite Arbour, MD   5 mg at 03/22/17 1750  . metoprolol tartrate (LOPRESSOR) tablet 25 mg  25 mg Oral BID Marguarite Arbour, MD   25 mg at 03/23/17 0846  . morphine 2 MG/ML injection 2 mg  2 mg Intravenous Once PRN Governor Rooks, MD      . morphine 2 MG/ML injection 2 mg  2 mg Intravenous Q2H PRN Marguarite Arbour, MD   2 mg at 03/23/17 1610  . multivitamin  with minerals tablet 1 tablet  1 tablet Oral Daily Marguarite Arbour, MD   1 tablet at 03/23/17 0846  . nitroGLYCERIN (NITROGLYN) 2 % ointment 1 inch  1 inch Topical Q6H Marguarite Arbour, MD   1 inch at 03/23/17 [phone]  . nitroGLYCERIN (NITROSTAT) SL tablet 0.4 mg  0.4 mg Sublingual Q5 min PRN Governor Rooks, MD      . ondansetron [facility]) tablet 4 mg  4 mg Oral Q6H PRN Marguarite Arbour, MD       Or  . ondansetron (ZOFRAN) injection 4 mg  4 mg Intravenous Q6H PRN Marguarite Arbour, MD      . pantoprazole (PROTONIX) EC tablet 40 mg  40 mg Oral Daily Marguarite Arbour, MD   40 mg at 03/23/17 5409     Discharge Medications: Please see discharge summary for a list of discharge medications.  Relevant Imaging Results:  Relevant Lab Results:   Additional Information SSN:  [id number]  [place] Constable

## 2017-03-23 NOTE — Discharge Summary (Signed)
[employer] [facility] [place] [facility]   PATIENT NAME: Alexandria Hanson    MR#:  [id number]  DATE OF BIRTH:  Jun 20, 1929  DATE OF ADMISSION:  03/22/2017 ADMITTING PHYSICIAN: Marguarite Arbour, MD  DATE OF DISCHARGE: 03/23/2017  PRIMARY CARE PHYSICIAN: Gabriel Cirri, NP    ADMISSION DIAGNOSIS:  Troponin level elevated [R74.8] Nonspecific chest pain [R07.9]  DISCHARGE DIAGNOSIS:  Principal Problem:   Chest pain Active Problems:   Anxiety   Elevated troponin   ASCVD (arteriosclerotic cardiovascular disease)   SECONDARY DIAGNOSIS:   Past Medical History:  Diagnosis Date  . Anxiety   . Bleeding in brain [facility])    s/p fall  . CAD (coronary artery disease)    s/p MI 30 yrs ago  . Cerebral artery occlusion   . Depression   . Hypertension   . MI (mitral incompetence)   . Stroke (HCC) 2001  . Stroke [facility])     HOSPITAL COURSE:   83 year old female with past medical history of coronary artery disease, previous history of MI, hypertension, history of previous CVA with right-sided hemiparesis, anxiety, depression who presented to the hospital from a skilled nursing facility secondary to chest pain.  1. Chest pain-given patient's previous history of coronary disease and recent non-ST elevation MI she was observed overnight on telemetry. She had 3 sets of cardiac markers checked which trended down. She presently has no chest pain. She was placed empirically on heparin drip which has now been discontinued. -Since she is clinically asymptomatic she is being discharged back to his cholesterol facility on aspirin, Plavix, beta blocker, statin. She'll follow-up with her cardiologist as outpatient. Cardiologist (Dr. Gwen Pounds) did see her while she was in the hospital who agreed with this management.  2. Hx of Previous CVA - cont. ASA, Plavix, Statin.   3. Depression - pt. Will cont. Prozac.   4. Hyperlipidemia - cont. Atorvastatin.   5. HTN - cont. HCTZ, Lisinopril, Imdur,  Metoprolol.  - BP stable while in the hospital.   6. GERD - pt. Will cont. Her Protonix.   DISCHARGE CONDITIONS:   Stable.   CONSULTS OBTAINED:    DRUG ALLERGIES:  No Known Allergies  DISCHARGE MEDICATIONS:   Allergies as of 03/23/2017   No Known Allergies     Medication List    TAKE these medications   acetaminophen 325 MG tablet Commonly known as:  TYLENOL Take 650 mg by mouth every 4 (four) hours as needed for mild pain or moderate pain.   aspirin 81 MG chewable tablet Chew 81 mg by mouth daily.   atorvastatin 40 MG tablet Commonly known as:  LIPITOR Take 1 tablet (40 mg total) by mouth daily at 6 PM.   clopidogrel 75 MG tablet Commonly known as:  PLAVIX Take 1 tablet (75 mg total) by mouth daily.   FLUoxetine 10 MG capsule Commonly known as:  PROZAC Take 10 mg by mouth daily.   gi cocktail Susp suspension Take 30 mLs by mouth 3 (three) times daily as needed for indigestion. Shake well.   guaifenesin 100 MG/5ML syrup Commonly known as:  ROBITUSSIN Take 100 mg by mouth every 4 (four) hours as needed for cough.   hydrochlorothiazide 25 MG tablet Commonly known as:  HYDRODIURIL Take 1 tablet (25 mg total) by mouth daily.   isosorbide mononitrate 30 MG 24 hr tablet Commonly known as:  IMDUR Take 30 mg by mouth daily.   lisinopril 5 MG tablet Commonly known as:  PRINIVIL,ZESTRIL Take 5  mg by mouth daily.   metoCLOPramide 5 MG tablet Commonly known as:  REGLAN Take 5 mg by mouth every evening.   metoprolol tartrate 25 MG tablet Commonly known as:  LOPRESSOR Take 1 tablet (25 mg total) by mouth 2 (two) times daily.   multivitamin tablet Take 1 tablet by mouth daily.   nitroGLYCERIN 0.4 MG/SPRAY spray Commonly known as:  NITROLINGUAL Place 1 spray under the tongue every 5 (five) minutes x 3 doses as needed for chest pain.   pantoprazole 40 MG tablet Commonly known as:  PROTONIX Take 40 mg by mouth daily.            Discharge Care  Instructions        Start     Ordered   03/23/17 0000  Activity as tolerated - No restrictions     03/23/17 1134   03/23/17 0000  Diet - low sodium heart healthy     03/23/17 1134        DISCHARGE INSTRUCTIONS:   DIET:  Cardiac diet  DISCHARGE CONDITION:  Stable  ACTIVITY:  Activity as tolerated  OXYGEN:  Home Oxygen: No.   Oxygen Delivery: room air  DISCHARGE LOCATION:  nursing home   If you experience worsening of your admission symptoms, develop shortness of breath, life threatening emergency, suicidal or homicidal thoughts you must seek medical attention immediately by calling 911 or calling your MD immediately  if symptoms less severe.  You Must read complete instructions/literature along with all the possible adverse reactions/side effects for all the Medicines you take and that have been prescribed to you. Take any new Medicines after you have completely understood and accpet all the possible adverse reactions/side effects.   Please note  You were cared for by a hospitalist during your hospital stay. If you have any questions about your discharge medications or the care you received while you were in the hospital after you are discharged, you can call the unit and asked to speak with the hospitalist on call if the hospitalist that took care of you is not available. Once you are discharged, your primary care physician will handle any further medical issues. Please note that NO REFILLS for any discharge medications will be authorized once you are discharged, as it is imperative that you return to your primary care physician (or establish a relationship with a primary care physician if you do not have one) for your aftercare needs so that they can reassess your need for medications and monitor your lab values.     Today   No acute chest pain presently. Trop. Have trended down. No other complaints or events overnight.   VITAL SIGNS:  Blood pressure (!) 110/53,  pulse 70, temperature 97.9 F (36.6 C), temperature source Oral, resp. rate 18, height  (1.6 m), weight 69.5 kg (153 lb 3.2 oz), SpO2 90 %.  I/O:   Intake/Output Summary (Last 24 hours) at 03/23/17 1135 Last data filed at 03/23/17 1003  Gross per 24 hour  Intake          1672.05 ml  Output              850 ml  Net           822.05 ml    PHYSICAL EXAMINATION:   GENERAL:  83 y.o.-year-old patient lying in the bed with no acute distress.  EYES: Pupils equal, round, reactive to light and accommodation. No scleral icterus. Extraocular muscles intact.  HEENT: Head atraumatic, normocephalic.  Oropharynx and nasopharynx clear.  NECK:  Supple, no jugular venous distention. No thyroid enlargement, no tenderness.  LUNGS: Normal breath sounds bilaterally, no wheezing, rales,rhonchi. No use of accessory muscles of respiration.  CARDIOVASCULAR: S1, S2 normal. No murmurs, rubs, or gallops.  ABDOMEN: Soft, non-tender, non-distended. Bowel sounds present. No organomegaly or mass.  EXTREMITIES: No pedal edema, cyanosis, or clubbing.  NEUROLOGIC: Cranial nerves II through XII are intact. No focal motor or sensory defecits b/l.  PSYCHIATRIC: The patient is alert and oriented x 3.   SKIN: No obvious rash, lesion, or ulcer.   DATA REVIEW:   CBC  Recent Labs Lab 03/23/17 0241  WBC 7.7  HGB 12.2  HCT 34.4*  PLT 246    Chemistries   Recent Labs Lab 03/23/17 0241  NA 137  K 4.2  CL 103  CO2 27  GLUCOSE 117*  BUN 14  CREATININE 0.72  CALCIUM 9.1  AST 44*  ALT 25  ALKPHOS 61  BILITOT 1.6*    Cardiac Enzymes  Recent Labs Lab 03/23/17 0241  TROPONINI 0.06*    Microbiology Results  Results for orders placed or performed during the hospital encounter of 03/22/17  MRSA PCR Screening     Status: None   Collection Time: 03/22/17  4:02 PM  Result Value Ref Range Status   MRSA by PCR NEGATIVE NEGATIVE Final    Comment:        The GeneXpert MRSA Assay (FDA approved for NASAL  specimens only), is one component of a comprehensive MRSA colonization surveillance program. It is not intended to diagnose MRSA infection nor to guide or monitor treatment for MRSA infections.     RADIOLOGY:  Dg Chest Port 1 View  Result Date: 03/22/2017 CLINICAL DATA:  Central chest pain. EXAM: PORTABLE CHEST 1 VIEW COMPARISON:  January 26, 2017 FINDINGS: The cardiomediastinal silhouette is stable with a torturous thoracic aorta. No pneumothorax. No pulmonary nodules, masses, or focal infiltrates. IMPRESSION: No active disease. Electronically Signed   By: Gerome Sam III M.D   On: 03/22/2017 08:41      Management plans discussed with the patient, family and they are in agreement.  CODE STATUS:     Code Status Orders        Start     Ordered   03/22/17 1534  Full code  Continuous     03/22/17 1533  Advance Directive Documentation     Most Recent Value  Type of Advance Directive  Out of facility DNR (pink MOST or yellow form)  Pre-existing out of facility DNR order (yellow form or pink MOST form)  Yellow form placed in chart (order not valid for inpatient use)  "MOST" Form in Place?  -      TOTAL TIME TAKING CARE OF THIS PATIENT: 40 minutes.    Houston Siren M.D on 03/23/2017 at 11:35 AM  Between 7am to 6pm - Pager - [phone]  After 6pm go to [email or web address] - Scientist, research (life sciences) [facility]  Office  [phone]  CC: Primary care physician; Gabriel Cirri, NP

## 2017-03-23 NOTE — Progress Notes (Signed)
Patient is transfer to SNF in a stable condition, report given to floor nurse , awaiting pick up by EMS

## 2017-03-23 NOTE — Care Management Obs Status (Signed)
MEDICARE OBSERVATION STATUS NOTIFICATION   Patient Details  Name: Alexandria Hanson MRN: [id number] Date of Birth: 11/28/1928   Medicare Observation Status Notification Given:  No < 24 hours   Eber Hong, RN 03/23/2017, 3:00 PM

## 2017-04-01 ENCOUNTER — Observation Stay
Admission: EM | Admit: 2017-04-01 | Discharge: 2017-04-02 | Disposition: A | Discharge status: Skilled Nursing Facility | Payer: Medicare Other | Attending: Internal Medicine | Admitting: Internal Medicine

## 2017-04-01 ENCOUNTER — Emergency Department: Payer: Medicare Other

## 2017-04-01 DIAGNOSIS — I2510 Atherosclerotic heart disease of native coronary artery without angina pectoris: Secondary | ICD-10-CM | POA: Diagnosis not present

## 2017-04-01 DIAGNOSIS — Z8673 Personal history of transient ischemic attack (TIA), and cerebral infarction without residual deficits: Secondary | ICD-10-CM | POA: Diagnosis not present

## 2017-04-01 DIAGNOSIS — Z7902 Long term (current) use of antithrombotics/antiplatelets: Secondary | ICD-10-CM | POA: Insufficient documentation

## 2017-04-01 DIAGNOSIS — E785 Hyperlipidemia, unspecified: Secondary | ICD-10-CM | POA: Insufficient documentation

## 2017-04-01 DIAGNOSIS — F419 Anxiety disorder, unspecified: Secondary | ICD-10-CM | POA: Diagnosis not present

## 2017-04-01 DIAGNOSIS — Z66 Do not resuscitate: Secondary | ICD-10-CM | POA: Insufficient documentation

## 2017-04-01 DIAGNOSIS — I252 Old myocardial infarction: Secondary | ICD-10-CM | POA: Insufficient documentation

## 2017-04-01 DIAGNOSIS — E871 Hypo-osmolality and hyponatremia: Secondary | ICD-10-CM | POA: Insufficient documentation

## 2017-04-01 DIAGNOSIS — Z79899 Other long term (current) drug therapy: Secondary | ICD-10-CM | POA: Diagnosis not present

## 2017-04-01 DIAGNOSIS — F329 Major depressive disorder, single episode, unspecified: Secondary | ICD-10-CM | POA: Diagnosis not present

## 2017-04-01 DIAGNOSIS — Z791 Long term (current) use of non-steroidal anti-inflammatories (NSAID): Secondary | ICD-10-CM | POA: Insufficient documentation

## 2017-04-01 DIAGNOSIS — I110 Hypertensive heart disease with heart failure: Secondary | ICD-10-CM | POA: Insufficient documentation

## 2017-04-01 DIAGNOSIS — Z7982 Long term (current) use of aspirin: Secondary | ICD-10-CM | POA: Insufficient documentation

## 2017-04-01 DIAGNOSIS — R079 Chest pain, unspecified: Secondary | ICD-10-CM | POA: Diagnosis not present

## 2017-04-01 DIAGNOSIS — I5022 Chronic systolic (congestive) heart failure: Secondary | ICD-10-CM | POA: Diagnosis not present

## 2017-04-01 LAB — CBC WITH DIFFERENTIAL/PLATELET
BASOS ABS: 0 10*3/uL (ref 0–0.1)
BASOS PCT: 0 %
Eosinophils Absolute: 0.1 10*3/uL (ref 0–0.7)
Eosinophils Relative: 1 %
HEMATOCRIT: 34.1 % — AB (ref 35.0–47.0)
Hemoglobin: 12 g/dL (ref 12.0–16.0)
Lymphocytes Relative: 39 %
Lymphs Abs: 2.8 10*3/uL (ref 1.0–3.6)
MCH: 31.9 pg (ref 26.0–34.0)
MCHC: 35.2 g/dL (ref 32.0–36.0)
MCV: 90.8 fL (ref 80.0–100.0)
MONO ABS: 0.6 10*3/uL (ref 0.2–0.9)
Monocytes Relative: 8 %
NEUTROS ABS: 3.7 10*3/uL (ref 1.4–6.5)
Neutrophils Relative %: 52 %
PLATELETS: 229 10*3/uL (ref 150–440)
RBC: 3.76 MIL/uL — AB (ref 3.80–5.20)
RDW: 13.7 % (ref 11.5–14.5)
WBC: 7.2 10*3/uL (ref 3.6–11.0)

## 2017-04-01 LAB — BASIC METABOLIC PANEL
ANION GAP: 8 (ref 5–15)
BUN: 14 mg/dL (ref 6–20)
CALCIUM: 9.1 mg/dL (ref 8.9–10.3)
CO2: 25 mmol/L (ref 22–32)
Chloride: 100 mmol/L — ABNORMAL LOW (ref 101–111)
Creatinine, Ser: 0.74 mg/dL (ref 0.44–1.00)
Glucose, Bld: 125 mg/dL — ABNORMAL HIGH (ref 65–99)
POTASSIUM: 3.7 mmol/L (ref 3.5–5.1)
Sodium: 133 mmol/L — ABNORMAL LOW (ref 135–145)

## 2017-04-01 LAB — TROPONIN I: Troponin I: <0.03 ng/mL (ref <0.03)

## 2017-04-01 MED ORDER — ISOSORBIDE MONONITRATE ER 30 MG PO TB24
30.0000 mg | ORAL_TABLET | Freq: Every day | ORAL | Status: DC
  Administered 2017-04-01 – 2017-04-02 (×2): 30 mg via ORAL
  Filled 2017-04-01 (×2): qty 1

## 2017-04-01 MED ORDER — ASPIRIN EC 81 MG PO TBEC
81.0000 mg | DELAYED_RELEASE_TABLET | Freq: Every day | ORAL | Status: DC
  Administered 2017-04-02: 81 mg via ORAL
  Filled 2017-04-01: qty 1

## 2017-04-01 MED ORDER — ASPIRIN 300 MG RE SUPP: 300.0000 mg | RECTAL | Status: DC

## 2017-04-01 MED ORDER — SODIUM CHLORIDE 0.9% FLUSH
3.0000 mL | Freq: Two times a day (BID) | INTRAVENOUS | Status: DC
  Administered 2017-04-01 – 2017-04-02 (×2): 3 mL via INTRAVENOUS

## 2017-04-01 MED ORDER — METOCLOPRAMIDE HCL 5 MG PO TABS
5.0000 mg | ORAL_TABLET | Freq: Every evening | ORAL | Status: DC
  Administered 2017-04-01: 5 mg via ORAL
  Filled 2017-04-01: qty 1

## 2017-04-01 MED ORDER — LISINOPRIL 5 MG PO TABS
5.0000 mg | ORAL_TABLET | Freq: Every day | ORAL | Status: DC
  Administered 2017-04-01 – 2017-04-02 (×2): 5 mg via ORAL
  Filled 2017-04-01 (×2): qty 1

## 2017-04-01 MED ORDER — ATORVASTATIN CALCIUM 20 MG PO TABS
40.0000 mg | ORAL_TABLET | Freq: Every day | ORAL | Status: DC
  Administered 2017-04-01: 40 mg via ORAL
  Filled 2017-04-01: qty 2

## 2017-04-01 MED ORDER — METOPROLOL TARTRATE 25 MG PO TABS
25.0000 mg | ORAL_TABLET | Freq: Two times a day (BID) | ORAL | Status: DC
  Administered 2017-04-01 – 2017-04-02 (×3): 25 mg via ORAL
  Filled 2017-04-01 (×3): qty 1

## 2017-04-01 MED ORDER — FLUOXETINE HCL 10 MG PO CAPS
10.0000 mg | ORAL_CAPSULE | Freq: Every day | ORAL | Status: DC
  Administered 2017-04-01 – 2017-04-02 (×2): 10 mg via ORAL
  Filled 2017-04-01 (×2): qty 1

## 2017-04-01 MED ORDER — ASPIRIN 81 MG PO CHEW: 324.0000 mg | CHEWABLE_TABLET | ORAL | Status: DC

## 2017-04-01 MED ORDER — ADULT MULTIVITAMIN W/MINERALS CH
1.0000 | ORAL_TABLET | Freq: Every day | ORAL | Status: DC
  Administered 2017-04-02: 1 via ORAL
  Filled 2017-04-01 (×2): qty 1

## 2017-04-01 MED ORDER — ONDANSETRON HCL 4 MG/2ML IJ SOLN: 4.0000 mg | Freq: Four times a day (QID) | INTRAMUSCULAR | Status: DC | PRN

## 2017-04-01 MED ORDER — SODIUM CHLORIDE 0.9% FLUSH: 3.0000 mL | INTRAVENOUS | Status: DC | PRN

## 2017-04-01 MED ORDER — NITROGLYCERIN 0.4 MG SL SUBL
0.4000 mg | SUBLINGUAL_TABLET | SUBLINGUAL | Status: DC | PRN
Start: 2017-04-01 — End: 2017-04-02

## 2017-04-01 MED ORDER — PANTOPRAZOLE SODIUM 40 MG PO TBEC
40.0000 mg | DELAYED_RELEASE_TABLET | Freq: Every day | ORAL | Status: DC
  Filled 2017-04-01: qty 1

## 2017-04-01 MED ORDER — PANTOPRAZOLE SODIUM 40 MG PO TBEC
40.0000 mg | DELAYED_RELEASE_TABLET | Freq: Two times a day (BID) | ORAL | Status: DC
  Administered 2017-04-01 – 2017-04-02 (×2): 40 mg via ORAL
  Filled 2017-04-01 (×2): qty 1

## 2017-04-01 MED ORDER — GUAIFENESIN 100 MG/5ML PO SYRP
100.0000 mg | ORAL_SOLUTION | ORAL | Status: DC | PRN
  Filled 2017-04-01: qty 5

## 2017-04-01 MED ORDER — SODIUM CHLORIDE 0.9 % IV SOLN: 250.0000 mL | INTRAVENOUS | Status: DC | PRN

## 2017-04-01 MED ORDER — ENOXAPARIN SODIUM 40 MG/0.4ML ~~LOC~~ SOLN
40.0000 mg | SUBCUTANEOUS | Status: DC
  Administered 2017-04-01: 40 mg via SUBCUTANEOUS
  Filled 2017-04-01: qty 0.4

## 2017-04-01 MED ORDER — GI COCKTAIL ~~LOC~~
30.0000 mL | Freq: Three times a day (TID) | ORAL | Status: DC | PRN
  Filled 2017-04-01: qty 30

## 2017-04-01 MED ORDER — FAMOTIDINE IN NACL 20-0.9 MG/50ML-% IV SOLN
20.0000 mg | Freq: Once | INTRAVENOUS | Status: AC
  Administered 2017-04-01: 20 mg via INTRAVENOUS
  Filled 2017-04-01: qty 50

## 2017-04-01 MED ORDER — ACETAMINOPHEN 325 MG PO TABS
650.0000 mg | ORAL_TABLET | ORAL | Status: DC | PRN
  Administered 2017-04-01: 650 mg via ORAL
  Filled 2017-04-01: qty 2

## 2017-04-01 MED ORDER — CLOPIDOGREL BISULFATE 75 MG PO TABS
75.0000 mg | ORAL_TABLET | Freq: Every day | ORAL | Status: DC
  Administered 2017-04-01 – 2017-04-02 (×2): 75 mg via ORAL
  Filled 2017-04-01 (×2): qty 1

## 2017-04-01 NOTE — Care Management Obs Status (Signed)
MEDICARE OBSERVATION STATUS NOTIFICATION   Patient Details  Name: Alexandria Hanson MRN: [id number] Date of Birth: 11-12-1928   Medicare Observation Status Notification Given:  Yes    Marily Memos, RN 04/01/2017, 10:44 AM

## 2017-04-01 NOTE — ED Notes (Signed)
Alvan Dame (Daughter) [phone]

## 2017-04-01 NOTE — ED Triage Notes (Signed)
Pt arrived via EMS from [place] facility with complaints of midsternal chest pain that started today abt an hour ago. Facility gave her 324 ASA and 3 Nitro which helped to relieve some of her pain. Pt was seen a few weeks ago for NSTEMI. VS per EMS were BP-136/76 HR-80s O2-95%RA. Pt states no pain at this time.

## 2017-04-01 NOTE — ED Provider Notes (Signed)
[facility] Emergency Department Provider Note    First MD Initiated Contact with Patient 04/01/17 [phone]     (approximate)  I have reviewed the triage vital signs and the nursing notes.   HISTORY  Chief Complaint Chest Pain    HPI Alexandria Hanson is a 83 y.o. female presents with chief complaint of chest pain that awoke her from sleep around 2 AM associated with shortness of breath, diaphoresis. No nausea or vomiting. Patient states the pain lasted roughly 30-45 minutes. EMS was called to the rest home. She was given aspirin and nitroglycerin. Didn't have improvement in symptoms after nitroglycerin. She is currently chest pain-free. She was just recently admitted to the hospital for chest pain workup. She had limited workup in the hospital with serial enzymes and was discharged to follow-up as an outpatient.   Past Medical History:  Diagnosis Date  . Anxiety   . Bleeding in brain [facility])    s/p fall  . CAD (coronary artery disease)    s/p MI 30 yrs ago  . Cerebral artery occlusion   . Depression   . Hypertension   . MI (mitral incompetence)   . Stroke (HCC) 2001  . Stroke [facility])    Family History  Problem Relation Age of Onset  . CVA Mother   . CAD Father   . CAD Sister   . Hyperlipidemia Mother   . Hypertension Mother   . Diabetes Sister   . Hypertension Sister    Past Surgical History:  Procedure Laterality Date  . ABDOMINAL HYSTERECTOMY    . APPENDECTOMY    . CHOLECYSTECTOMY    . COLON SURGERY     knot on colon removed   Patient Active Problem List   Diagnosis Date Noted  . Elevated troponin 03/22/2017  . ASCVD (arteriosclerotic cardiovascular disease) 03/22/2017  . Chest pain 01/26/2017  . NSTEMI (non-ST elevated myocardial infarction) (HCC) 01/26/2017  . Stroke (HCC) 03/08/2016  . Depression   . Anxiety   . Hypertension       Prior to Admission medications   Medication Sig Start Date End Date Taking? Authorizing Provider    acetaminophen (TYLENOL) 325 MG tablet Take 650 mg by mouth every 4 (four) hours as needed for mild pain or moderate pain.    [provider]  Alum & Mag Hydroxide-Simeth (GI COCKTAIL) SUSP suspension Take 30 mLs by mouth 3 (three) times daily as needed for indigestion. Shake well. 01/28/17   Shaune Pollack, MD  aspirin 81 MG chewable tablet Chew 81 mg by mouth daily.    [provider]  atorvastatin (LIPITOR) 40 MG tablet Take 1 tablet (40 mg total) by mouth daily at 6 PM. 03/11/16   Auburn Bilberry, MD  clopidogrel (PLAVIX) 75 MG tablet Take 1 tablet (75 mg total) by mouth daily. 03/11/16   Auburn Bilberry, MD  FLUoxetine (PROZAC) 10 MG capsule Take 10 mg by mouth daily.    [provider]  guaifenesin (ROBITUSSIN) 100 MG/5ML syrup Take 100 mg by mouth every 4 (four) hours as needed for cough.    [provider]  hydrochlorothiazide (HYDRODIURIL) 25 MG tablet Take 1 tablet (25 mg total) by mouth daily. 01/18/16   Gabriel Cirri, NP  isosorbide mononitrate (IMDUR) 30 MG 24 hr tablet Take 30 mg by mouth daily.    [provider]  lisinopril (PRINIVIL,ZESTRIL) 5 MG tablet Take 5 mg by mouth daily.    [provider]  metoCLOPramide (REGLAN) 5 MG tablet  Take 5 mg by mouth every evening.    [provider]  metoprolol tartrate (LOPRESSOR) 25 MG tablet Take 1 tablet (25 mg total) by mouth 2 (two) times daily. 01/28/17   Shaune Pollack, MD  Multiple Vitamin (MULTIVITAMIN) tablet Take 1 tablet by mouth daily.    [provider]  nitroGLYCERIN (NITROLINGUAL) 0.4 MG/SPRAY spray Place 1 spray under the tongue every 5 (five) minutes x 3 doses as needed for chest pain.    [provider]  pantoprazole (PROTONIX) 40 MG tablet Take 40 mg by mouth daily.    [provider]    Allergies Patient has no known allergies.    Social History Social History  Substance Use Topics  . Smoking status: Never Smoker  . Smokeless tobacco: Never  Used  . Alcohol use No    Review of Systems Patient denies headaches, rhinorrhea, blurry vision, numbness, shortness of breath, chest pain, edema, cough, abdominal pain, nausea, vomiting, diarrhea, dysuria, fevers, rashes or hallucinations unless otherwise stated above in HPI. ____________________________________________   PHYSICAL EXAM:  VITAL SIGNS: Vitals:   04/01/17 0405  BP: (!) 128/58  Pulse: 67  Resp: 18  SpO2: 96%    Constitutional: Alert,  in no acute distress. Eyes: Conjunctivae are normal.  Head: Atraumatic. Nose: No congestion/rhinnorhea. Mouth/Throat: Mucous membranes are moist.   Neck: No stridor. Painless ROM.  Cardiovascular: Normal rate, regular rhythm. Grossly normal heart sounds.  Good peripheral circulation. Respiratory: Normal respiratory effort.  No retractions. Lungs CTAB. Gastrointestinal: Soft and nontender. No distention. No abdominal bruits. No CVA tenderness. Genitourinary:  Musculoskeletal: No lower extremity tenderness nor edema.  No joint effusions. Neurologic:  Normal speech and language. No gross focal neurologic deficits are appreciated. No facial droop Skin:  Skin is warm, dry and intact. No rash noted. Psychiatric: Mood and affect are normal. Speech and behavior are normal.  ____________________________________________   LABS (all labs ordered are listed, but only abnormal results are displayed)  No results found for this or any previous visit (from the past 24 hour(s)). ____________________________________________  EKG My review and personal interpretation at Time: 4:07   Indication: chest pain  Rate: 70  Rhythm: sinus Axis: left Other: lbbb with inferolateral t wave inversions which are new as compared to previous, no stemi ____________________________________________  RADIOLOGY  I personally reviewed all radiographic images ordered to evaluate for the above acute complaints and reviewed radiology reports and findings.  These  findings were personally discussed with the patient.  Please see medical record for radiology report.  ____________________________________________   PROCEDURES  Procedure(s) performed:  Procedures    Critical Care performed: no ____________________________________________   INITIAL IMPRESSION / ASSESSMENT AND PLAN / ED COURSE  Pertinent labs & imaging results that were available during my care of the patient were reviewed by me and considered in my medical decision making (see chart for details).  DDX: ACS, pericarditis, esophagitis, boerhaaves, pe, dissection, pna, bronchitis, costochondritis   Alexandria Hanson is a 83 y.o. who presents to the ED with Chest pain as described above. Currently hemodynamic stable his chest pain-free but on evaluation it does appear that she has T-wave changes as compared to previous. Initial troponin is negative. Electrolytes are normal. Does not seem clinically consistent with dissection or pulmonary embolism. Given her history of CAD I am concerned for worsening angina or CAD. Based on her high-risk profile with EKG changes I do believe the patient requires admission to hospital for further evaluation and management.  Have discussed with  the patient and available family all diagnostics and treatments performed thus far and all questions were answered to the best of my ability. The patient demonstrates understanding and agreement with plan.       ____________________________________________   FINAL CLINICAL IMPRESSION(S) / ED DIAGNOSES  Final diagnoses:  Chest pain, unspecified type      NEW MEDICATIONS STARTED DURING THIS VISIT:  New Prescriptions   No medications on file     Note:  This document was prepared using Dragon voice recognition software and may include unintentional dictation errors.    Willy Eddy, MD 04/01/17 [phone]

## 2017-04-01 NOTE — H&P (Addendum)
[facility] [place] at [facility]   PATIENT NAME: Alexandria Hanson    MR#:  [id number]  DATE OF BIRTH:  Apr 19, 1929  DATE OF ADMISSION:  04/01/2017  PRIMARY CARE PHYSICIAN: Gabriel Cirri, NP   REQUESTING/REFERRING PHYSICIAN:   CHIEF COMPLAINT:   Chief Complaint  Patient presents with  . Chest Pain    HISTORY OF PRESENT ILLNESS: Alexandria Hanson  is a 83 y.o. female with a known history of Anxiety disorder, coronary artery disease, as hypertension, CVA presented to the emergency room with chest pain. [facility]. Patient has chest pain since one day. Patient was recently discharged from our hospital after being managed for non-ST elevation MI. Follows up with cardiology as outpatient. The chest pain is sharp in nature 7 out of 10 on a scale of 1-10 located retrosternally. First set of troponin was negative. Hospitalist service was consulted. Patient does not want any cardiac resuscitation and intubation and mechanical ventilation if the need arises. Patient is a DO NOT RESUSCITATE by CODE STATUS.  PAST MEDICAL HISTORY:   Past Medical History:  Diagnosis Date  . Anxiety   . Bleeding in brain [facility])    s/p fall  . CAD (coronary artery disease)    s/p MI 30 yrs ago  . Cerebral artery occlusion   . Depression   . Hypertension   . MI (mitral incompetence)   . Stroke (HCC) 2001  . Stroke [facility])     PAST SURGICAL HISTORY: Past Surgical History:  Procedure Laterality Date  . ABDOMINAL HYSTERECTOMY    . APPENDECTOMY    . CHOLECYSTECTOMY    . COLON SURGERY     knot on colon removed    SOCIAL HISTORY:  Social History  Substance Use Topics  . Smoking status: Never Smoker  . Smokeless tobacco: Never Used  . Alcohol use No    FAMILY HISTORY:  Family History  Problem Relation Age of Onset  . CVA Mother   . CAD Father   . CAD Sister   . Hyperlipidemia Mother   . Hypertension Mother   . Diabetes Sister   . Hypertension Sister      DRUG ALLERGIES: No Known Allergies  REVIEW OF SYSTEMS:   CONSTITUTIONAL: No fever, fatigue or weakness.  EYES: No blurred or double vision.  EARS, NOSE, AND THROAT: No tinnitus or ear pain.  RESPIRATORY: No cough, shortness of breath, wheezing or hemoptysis.  CARDIOVASCULAR: Has chest pain,  No orthopnea, edema.  GASTROINTESTINAL: No nausea, vomiting, diarrhea or abdominal pain.  GENITOURINARY: No dysuria, hematuria.  ENDOCRINE: No polyuria, nocturia,  HEMATOLOGY: No anemia, easy bruising or bleeding SKIN: No rash or lesion. MUSCULOSKELETAL: No joint pain or arthritis.   NEUROLOGIC: No tingling, numbness, weakness.  PSYCHIATRY: No anxiety or depression.   MEDICATIONS AT HOME:  Prior to Admission medications   Medication Sig Start Date End Date Taking? Authorizing Provider  acetaminophen (TYLENOL) 325 MG tablet Take 650 mg by mouth every 4 (four) hours as needed for mild pain or moderate pain.   Yes [provider]  Alum & Mag Hydroxide-Simeth (GI COCKTAIL) SUSP suspension Take 30 mLs by mouth 3 (three) times daily as needed for indigestion. Shake well. 01/28/17  Yes Shaune Pollack, MD  aspirin 81 MG chewable tablet Chew 81 mg by mouth daily.   Yes [provider]  atorvastatin (LIPITOR) 40 MG tablet Take 1 tablet (40 mg total) by mouth daily at 6 PM. 03/11/16  Yes  Auburn Bilberry, MD  clopidogrel (PLAVIX) 75 MG tablet Take 1 tablet (75 mg total) by mouth daily. 03/11/16  Yes Auburn Bilberry, MD  FLUoxetine (PROZAC) 10 MG capsule Take 10 mg by mouth daily.   Yes [provider]  guaifenesin (ROBITUSSIN) 100 MG/5ML syrup Take 100 mg by mouth every 4 (four) hours as needed for cough.   Yes [provider]  hydrochlorothiazide (HYDRODIURIL) 25 MG tablet Take 1 tablet (25 mg total) by mouth daily. 01/18/16  Yes Gabriel Cirri, NP  isosorbide mononitrate (IMDUR) 30 MG 24 hr tablet Take 30 mg by mouth daily.   Yes [provider]  lisinopril  (PRINIVIL,ZESTRIL) 5 MG tablet Take 5 mg by mouth daily.   Yes [provider]  metoCLOPramide (REGLAN) 5 MG tablet Take 5 mg by mouth every evening.   Yes [provider]  metoprolol tartrate (LOPRESSOR) 25 MG tablet Take 1 tablet (25 mg total) by mouth 2 (two) times daily. 01/28/17  Yes Shaune Pollack, MD  Multiple Vitamin (MULTIVITAMIN) tablet Take 1 tablet by mouth daily.   Yes [provider]  nitroGLYCERIN (NITROLINGUAL) 0.4 MG/SPRAY spray Place 1 spray under the tongue every 5 (five) minutes x 3 doses as needed for chest pain.   Yes [provider]  pantoprazole (PROTONIX) 40 MG tablet Take 40 mg by mouth daily.   Yes [provider]      PHYSICAL EXAMINATION:   VITAL SIGNS: Blood pressure (!) 126/52, pulse 64, resp. rate 16, height  (1.575 m), SpO2 94 %.  GENERAL:  83 y.o.-year-old patient lying in the bed with no acute distress.  EYES: Pupils equal, round, reactive to light and accommodation. No scleral icterus. Extraocular muscles intact.  HEENT: Head atraumatic, normocephalic. Oropharynx and nasopharynx clear.  NECK:  Supple, no jugular venous distention. No thyroid enlargement, no tenderness.  LUNGS: Normal breath sounds bilaterally, no wheezing, rales,rhonchi or crepitation. No use of accessory muscles of respiration.  CARDIOVASCULAR: S1, S2 normal. No murmurs, rubs, or gallops.  ABDOMEN: Soft, nontender, nondistended. Bowel sounds present. No organomegaly or mass.  EXTREMITIES: No pedal edema, cyanosis, or clubbing.  NEUROLOGIC: Cranial nerves II through XII are intact. Muscle strength 5/5 in all extremities. Sensation intact. Gait not checked.  PSYCHIATRIC: The patient is alert and oriented x 3.  SKIN: No obvious rash, lesion, or ulcer.   LABORATORY PANEL:   CBC  Recent Labs Lab 04/01/17 0409  WBC 7.2  HGB 12.0  HCT 34.1*  PLT 229  MCV 90.8  MCH 31.9  MCHC 35.2  RDW 13.7  LYMPHSABS 2.8  MONOABS 0.6  EOSABS 0.1   BASOSABS 0.0   ------------------------------------------------------------------------------------------------------------------  Chemistries   Recent Labs Lab 04/01/17 0409  NA 133*  K 3.7  CL 100*  CO2 25  GLUCOSE 125*  BUN 14  CREATININE 0.74  CALCIUM 9.1   ------------------------------------------------------------------------------------------------------------------ estimated creatinine clearance is 46.1 mL/min (by C-G formula based on SCr of 0.74 mg/dL). ------------------------------------------------------------------------------------------------------------------ No results for input(s): TSH, T4TOTAL, T3FREE, THYROIDAB in the last 72 hours.  Invalid input(s): FREET3   Coagulation profile No results for input(s): INR, PROTIME in the last 168 hours. ------------------------------------------------------------------------------------------------------------------- No results for input(s): DDIMER in the last 72 hours. -------------------------------------------------------------------------------------------------------------------  Cardiac Enzymes  Recent Labs Lab 04/01/17 0409  TROPONINI <0.03   ------------------------------------------------------------------------------------------------------------------ Invalid input(s): POCBNP  ---------------------------------------------------------------------------------------------------------------  Urinalysis    Component Value Date/Time   COLORURINE YELLOW (A) 03/07/2016 2021   APPEARANCEUR HAZY (A) 03/07/2016 2021   LABSPEC 1.015 03/07/2016 2021  PHURINE 6.0 03/07/2016 2021   GLUCOSEU NEGATIVE 03/07/2016 2021   HGBUR NEGATIVE 03/07/2016 2021   BILIRUBINUR NEGATIVE 03/07/2016 2021   KETONESUR TRACE (A) 03/07/2016 2021   PROTEINUR NEGATIVE 03/07/2016 2021   NITRITE NEGATIVE 03/07/2016 2021   LEUKOCYTESUR 3+ (A) 03/07/2016 2021     RADIOLOGY: Dg Chest Portable 1 View  Result Date:  04/01/2017 CLINICAL DATA:  Midsternal chest pain beginning an hour ago. Now at baseline. Assess for CHF for pneumonia. EXAM: PORTABLE CHEST 1 VIEW COMPARISON:  Chest radiograph March 22, 2017 FINDINGS: Cardiac silhouette is mildly enlarged and unchanged. Mediastinal silhouette is nonsuspicious, mildly calcified aortic knob. No pleural effusion or focal consolidation. No pneumothorax. Soft tissue planes included osseous structures are unchanged. Severe cervical facet arthropathy. Faint vascular calcifications in LEFT neck. IMPRESSION: Stable cardiomegaly, no acute pulmonary process. Aortic Atherosclerosis (ICD10-I70.0). Electronically Signed   By: Awilda Metro M.D.   On: 04/01/2017 04:45    EKG: Orders placed or performed during the hospital encounter of 04/01/17  . EKG 12-Lead  . EKG 12-Lead    IMPRESSION AND PLAN: 83 year old elderly female patient with history of coronary artery disease, non-STEMI, CVA, hypertension, anxiety disorder presented to the emergency room with chest pain.  Admitting diagnosis 1. Chest pain 2. Hypertension 3. Coronary artery disease 4. Hyponatremia Treatment plan Admit patient to telemetry observation bed Continue nitrates for chest pain Resume aspirin and Plavix at home dose Cycle troponin Cardiology consultation Discussed with daughter Alvan Dame ) medical condition and treatment paln.  All the records are reviewed and case discussed with ED provider. Management plans discussed with the patient, family and they are in agreement.  CODE STATUS:DNR    Code Status Orders        Start     Ordered   04/01/17 0611  Do not attempt resuscitation (DNR)  Continuous    Question Answer Comment  In the event of cardiac or respiratory ARREST Do not call a "code blue"   In the event of cardiac or respiratory ARREST Do not perform Intubation, CPR, defibrillation or ACLS   In the event of cardiac or respiratory ARREST Use medication by any route,  position, wound care, and other measures to relive pain and suffering. May use oxygen, suction and manual treatment of airway obstruction as needed for comfort.      04/01/17 0610    Code Status History    Date Active Date Inactive Code Status Order ID Comments User Context   03/22/2017  3:33 PM 03/23/2017  8:30 PM Full Code [id number]  Marguarite Arbour, MD ED   03/22/2017  2:15 PM 03/22/2017  3:33 PM DNR [id number]  Marguarite Arbour, MD ED   01/26/2017  7:47 AM 01/28/2017  9:46 PM DNR [id number]  Arnaldo Natal, MD Inpatient   03/07/2016  8:04 PM 03/08/2016 11:21 AM DNR [id number]  Alford Highland, MD ED       TOTAL TIME TAKING CARE OF THIS PATIENT: 50 minutes.    Ihor Austin M.D on 04/01/2017 at 6:10 AM  Between 7am to 6pm - Pager - [phone]  After 6pm go to [email or web address] - password [email or web address] [facility]  [place] [facility]  Office  [phone]  CC: Primary care physician; Gabriel Cirri, NP

## 2017-04-01 NOTE — Progress Notes (Signed)
Patient ID: Alexandria Hanson, female   DOB: 04/22/1929, 83 y.o.   MRN: [id number]  [facility] PROGRESS NOTE  Alexandria Hanson WUJ:[id number] DOB: 28-Aug-1928 DOA: 04/01/2017 PCP: Gabriel Cirri, NP  HPI/Subjective: Patient admitted early morning with chest pain. She feels a little bit better now but still feeling it. She points to her upper abdomen and lower chest. No nausea or vomiting.  Objective: Vitals:   04/01/17 0730 04/01/17 1200  BP: (!) 140/58 (!) 135/45  Pulse: 65 62  Resp: 20 17  Temp: 97.8 F (36.6 C) 98 F (36.7 C)  SpO2: 100% 100%    Filed Weights   04/01/17 0730  Weight: 69.6 kg (153 lb 6.4 oz)    ROS: Review of Systems  Constitutional: Negative for chills and fever.  Eyes: Negative for blurred vision.  Respiratory: Negative for cough and shortness of breath.   Cardiovascular: Positive for chest pain.  Gastrointestinal: Positive for abdominal pain. Negative for constipation, diarrhea, nausea and vomiting.  Genitourinary: Negative for dysuria.  Musculoskeletal: Negative for joint pain.  Neurological: Negative for dizziness and headaches.   Exam: Physical Exam  HENT:  Nose: No mucosal edema.  Mouth/Throat: No oropharyngeal exudate or posterior oropharyngeal edema.  Eyes: Pupils are equal, round, and reactive to light. Conjunctivae, EOM and lids are normal.  Neck: No JVD present. Carotid bruit is not present. No edema present. No thyroid mass and no thyromegaly present.  Cardiovascular: S1 normal and S2 normal.  Exam reveals no gallop.   No murmur heard. Pulses:      Dorsalis pedis pulses are 2+ on the right side, and 2+ on the left side.  Respiratory: No respiratory distress. She has no wheezes. She has no rhonchi. She has no rales.  GI: Soft. Bowel sounds are normal. There is no tenderness.  Musculoskeletal:       Right ankle: She exhibits no swelling.       Left ankle: She exhibits no swelling.  Lymphadenopathy:    She has no cervical adenopathy.   Neurological: She is alert.  Baseline right-sided weakness from stroke  Skin: Skin is warm. No rash noted. Nails show no clubbing.  Psychiatric: She has a normal mood and affect.      Data Reviewed: Basic Metabolic Panel:  Recent Labs Lab 04/01/17 0409  NA 133*  K 3.7  CL 100*  CO2 25  GLUCOSE 125*  BUN 14  CREATININE 0.74  CALCIUM 9.1   CBC:  Recent Labs Lab 04/01/17 0409  WBC 7.2  NEUTROABS 3.7  HGB 12.0  HCT 34.1*  MCV 90.8  PLT 229   Cardiac Enzymes:  Recent Labs Lab 04/01/17 0409 04/01/17 1112  TROPONINI <0.03 <0.03   BNP (last 3 results)  Recent Labs  01/26/17 0252  BNP 310.0*      Recent Results (from the past 240 hour(s))  MRSA PCR Screening     Status: None   Collection Time: 03/22/17  4:02 PM  Result Value Ref Range Status   MRSA by PCR NEGATIVE NEGATIVE Final    Comment:        The GeneXpert MRSA Assay (FDA approved for NASAL specimens only), is one component of a comprehensive MRSA colonization surveillance program. It is not intended to diagnose MRSA infection nor to guide or monitor treatment for MRSA infections.      Studies: Dg Chest Portable 1 View  Result Date: 04/01/2017 CLINICAL DATA:  Midsternal chest pain beginning an hour ago. Now at baseline. Assess for CHF for  pneumonia. EXAM: PORTABLE CHEST 1 VIEW COMPARISON:  Chest radiograph March 22, 2017 FINDINGS: Cardiac silhouette is mildly enlarged and unchanged. Mediastinal silhouette is nonsuspicious, mildly calcified aortic knob. No pleural effusion or focal consolidation. No pneumothorax. Soft tissue planes included osseous structures are unchanged. Severe cervical facet arthropathy. Faint vascular calcifications in LEFT neck. IMPRESSION: Stable cardiomegaly, no acute pulmonary process. Aortic Atherosclerosis (ICD10-I70.0). Electronically Signed   By: Awilda Metro M.D.   On: 04/01/2017 04:45    Scheduled Meds: . [START ON 04/02/2017] aspirin EC  81 mg Oral  Daily  . atorvastatin  40 mg Oral q1800  . clopidogrel  75 mg Oral Daily  . enoxaparin (LOVENOX) injection  40 mg Subcutaneous Q24H  . FLUoxetine  10 mg Oral Daily  . isosorbide mononitrate  30 mg Oral Daily  . lisinopril  5 mg Oral Daily  . metoCLOPramide  5 mg Oral QPM  . metoprolol tartrate  25 mg Oral BID  . multivitamin with minerals  1 tablet Oral Daily  . pantoprazole  40 mg Oral BID  . sodium chloride flush  3 mL Intravenous Q12H   Continuous Infusions: . sodium chloride    . famotidine (PEPCID) IV      Assessment/Plan:  1. Chest pain, history of CAD. Third cardiac enzyme negative. Cardiology did not recommend any further testing. Since her pain is in the upper abdomen, a could be gastrointestinal. The patient's gallbladder has been removed in the past. Give a dose of IV Pepcid. Increase Protonix to twice a day. 2. History of stroke on aspirin, Plavix, metoprolol and statin. Baseline right-sided weakness 3. Essential hypertension continue usual medications 4. Hyperlipidemia unspecified on atorvastatin 5. Depression on fluoxetine  Code Status:     Code Status Orders        Start     Ordered   04/01/17 [phone]  Do not attempt resuscitation (DNR)  Continuous    Question Answer Comment  In the event of cardiac or respiratory ARREST Do not call a "code blue"   In the event of cardiac or respiratory ARREST Do not perform Intubation, CPR, defibrillation or ACLS   In the event of cardiac or respiratory ARREST Use medication by any route, position, wound care, and other measures to relive pain and suffering. May use oxygen, suction and manual treatment of airway obstruction as needed for comfort.      04/01/17 0610    Code Status History    Date Active Date Inactive Code Status Order ID Comments User Context   03/22/2017  3:33 PM 03/23/2017  8:30 PM Full Code [id number]  Marguarite Arbour, MD ED   03/22/2017  2:15 PM 03/22/2017  3:33 PM DNR [id number]  Marguarite Arbour, MD ED    01/26/2017  7:47 AM 01/28/2017  9:46 PM DNR [id number]  Arnaldo Natal, MD Inpatient   03/07/2016  8:04 PM 03/08/2016 11:21 AM DNR [id number]  Alford Highland, MD ED    Advance Directive Documentation     Most Recent Value  Type of Advance Directive  Out of facility DNR (pink MOST or yellow form)  Pre-existing out of facility DNR order (yellow form or pink MOST form)  Physician notified to receive inpatient order  "MOST" Form in Place?  -     Family Communication: Spoke with daughter on the phone Disposition Plan: Likely back to facility tomorrow  Consultants:  Cardiology  Time spent: 35 minutes  Alford Highland  [employer]

## 2017-04-01 NOTE — Progress Notes (Signed)
Talked to Dr. Renae Gloss about patient's order for NPO, patient's stress test was discontinue, place order for heart healthy. RN will continue to monitor.

## 2017-04-01 NOTE — Plan of Care (Signed)
Problem: Safety: Goal: Ability to remain free from injury will improve Outcome: Progressing  Patient's VSS throughout the shift. Patient complaints of pain, PRN Tylenol given. Patient rested well. RN will continue to monitor.

## 2017-04-01 NOTE — Consult Note (Signed)
[facility] Cardiology Consultation Note  Patient ID: Alexandria Hanson, MRN: [id number], DOB/AGE: 1929/01/08 83 y.o. Admit date: 04/01/2017   Date of Consult: 04/01/2017 Primary Physician: Gabriel Cirri, NP Primary Cardiologist:None  Chief Complaint:  Chief Complaint  Patient presents with  . Chest Pain   Reason for Consult: chest pain  HPI: 83 y.o. female with known coronary artery disease status post previous myocardial infarction one month prior. She also has LV systolic dysfunction with ejection fraction of 30% most suspicious of her previous myocardial infarction. She has been on appropriate medication management including metoprolol lisinopril isosorbide and dual antiplatelet therapy as well as high intensity cholesterol therapy. The patient has had the readmission to the hospital for episodes of chest discomfort. Today this chest discomfort was most consistent with musculoskeletal and precordial pain most consistent with the central pain and left breast pain. Upon pressing on her chest she has significant worsening of the symptoms and that is continuing to be constant pain not consistent with acute coronary syndrome and a troponin of 0.03. She does have unchanged EKG had normal sinus rhythm with left axis deviation and right bundle-branch block. Currently the patient is hemodynamically stable and feeling relatively well on appropriate medication management  Past Medical History:  Diagnosis Date  . Anxiety   . Bleeding in brain [facility])    s/p fall  . CAD (coronary artery disease)    s/p MI 30 yrs ago  . Cerebral artery occlusion   . Depression   . Hypertension   . MI (mitral incompetence)   . Stroke (HCC) 2001  . Stroke [facility])       Surgical History:  Past Surgical History:  Procedure Laterality Date  . ABDOMINAL HYSTERECTOMY    . APPENDECTOMY    . CHOLECYSTECTOMY    . COLON SURGERY     knot on colon removed     Home Meds: Prior to Admission medications   Medication Sig  Start Date End Date Taking? Authorizing Provider  acetaminophen (TYLENOL) 325 MG tablet Take 650 mg by mouth every 4 (four) hours as needed for mild pain or moderate pain.   Yes [provider]  Alum & Mag Hydroxide-Simeth (GI COCKTAIL) SUSP suspension Take 30 mLs by mouth 3 (three) times daily as needed for indigestion. Shake well. 01/28/17  Yes Shaune Pollack, MD  aspirin 81 MG chewable tablet Chew 81 mg by mouth daily.   Yes [provider]  atorvastatin (LIPITOR) 40 MG tablet Take 1 tablet (40 mg total) by mouth daily at 6 PM. 03/11/16  Yes Auburn Bilberry, MD  clopidogrel (PLAVIX) 75 MG tablet Take 1 tablet (75 mg total) by mouth daily. 03/11/16  Yes Auburn Bilberry, MD  FLUoxetine (PROZAC) 10 MG capsule Take 10 mg by mouth daily.   Yes [provider]  guaifenesin (ROBITUSSIN) 100 MG/5ML syrup Take 100 mg by mouth every 4 (four) hours as needed for cough.   Yes [provider]  hydrochlorothiazide (HYDRODIURIL) 25 MG tablet Take 1 tablet (25 mg total) by mouth daily. 01/18/16  Yes Gabriel Cirri, NP  isosorbide mononitrate (IMDUR) 30 MG 24 hr tablet Take 30 mg by mouth daily.   Yes [provider]  lisinopril (PRINIVIL,ZESTRIL) 5 MG tablet Take 5 mg by mouth daily.   Yes [provider]  metoCLOPramide (REGLAN) 5 MG tablet Take 5 mg by mouth every evening.   Yes [provider]  metoprolol tartrate (LOPRESSOR) 25 MG tablet Take 1 tablet (25 mg total) by mouth  2 (two) times daily. 01/28/17  Yes Shaune Pollack, MD  Multiple Vitamin (MULTIVITAMIN) tablet Take 1 tablet by mouth daily.   Yes [provider]  nitroGLYCERIN (NITROLINGUAL) 0.4 MG/SPRAY spray Place 1 spray under the tongue every 5 (five) minutes x 3 doses as needed for chest pain.   Yes [provider]  pantoprazole (PROTONIX) 40 MG tablet Take 40 mg by mouth daily.   Yes [provider]    Inpatient Medications:  . aspirin  324 mg Oral NOW   Or  . aspirin   300 mg Rectal NOW  . [START ON 04/02/2017] aspirin EC  81 mg Oral Daily  . atorvastatin  40 mg Oral q1800  . clopidogrel  75 mg Oral Daily  . enoxaparin (LOVENOX) injection  40 mg Subcutaneous Q24H  . FLUoxetine  10 mg Oral Daily  . isosorbide mononitrate  30 mg Oral Daily  . lisinopril  5 mg Oral Daily  . metoCLOPramide  5 mg Oral QPM  . metoprolol tartrate  25 mg Oral BID  . multivitamin with minerals  1 tablet Oral Daily  . pantoprazole  40 mg Oral Daily  . sodium chloride flush  3 mL Intravenous Q12H   . sodium chloride      Allergies: No Known Allergies  Social History   Social History  . Marital status: Widowed    Spouse name: N/A  . Number of children: N/A  . Years of education: N/A   Occupational History  . Not on file.   Social History Main Topics  . Smoking status: Never Smoker  . Smokeless tobacco: Never Used  . Alcohol use No  . Drug use: No  . Sexual activity: No   Other Topics Concern  . Not on file   Social History Narrative   ** Merged History Encounter **         Family History  Problem Relation Age of Onset  . CVA Mother   . CAD Father   . CAD Sister   . Hyperlipidemia Mother   . Hypertension Mother   . Diabetes Sister   . Hypertension Sister      Review of Systems Positive forChest pain Negative for: General:  chills, fever, night sweats or weight changes.  Cardiovascular: PND orthopnea syncope dizziness  Dermatological skin lesions rashes Respiratory: Cough congestion Urologic: Frequent urination urination at night and hematuria Abdominal: negative for nausea, vomiting, diarrhea, bright red blood per rectum, melena, or hematemesis Neurologic: negative for visual changes, and/or hearing changes  All other systems reviewed and are otherwise negative except as noted above.  Labs:  Recent Labs  04/01/17 0409 04/01/17 1112  TROPONINI <0.03 <0.03   Lab Results  Component Value Date   WBC 7.2 04/01/2017   HGB 12.0 04/01/2017    HCT 34.1 (L) 04/01/2017   MCV 90.8 04/01/2017   PLT 229 04/01/2017    Recent Labs Lab 04/01/17 0409  NA 133*  K 3.7  CL 100*  CO2 25  BUN 14  CREATININE 0.74  CALCIUM 9.1  GLUCOSE 125*   No results found for: CHOL, HDL, LDLCALC, TRIG No results found for: DDIMER  Radiology/Studies:  Dg Chest Portable 1 View  Result Date: 04/01/2017 CLINICAL DATA:  Midsternal chest pain beginning an hour ago. Now at baseline. Assess for CHF for pneumonia. EXAM: PORTABLE CHEST 1 VIEW COMPARISON:  Chest radiograph March 22, 2017 FINDINGS: Cardiac silhouette is mildly enlarged and unchanged. Mediastinal silhouette is nonsuspicious, mildly calcified aortic knob. No  pleural effusion or focal consolidation. No pneumothorax. Soft tissue planes included osseous structures are unchanged. Severe cervical facet arthropathy. Faint vascular calcifications in LEFT neck. IMPRESSION: Stable cardiomegaly, no acute pulmonary process. Aortic Atherosclerosis (ICD10-I70.0). Electronically Signed   By: Awilda Metro M.D.   On: 04/01/2017 04:45   Dg Chest Port 1 View  Result Date: 03/22/2017 CLINICAL DATA:  Central chest pain. EXAM: PORTABLE CHEST 1 VIEW COMPARISON:  January 26, 2017 FINDINGS: The cardiomediastinal silhouette is stable with a torturous thoracic aorta. No pneumothorax. No pulmonary nodules, masses, or focal infiltrates. IMPRESSION: No active disease. Electronically Signed   By: Gerome Sam III M.D   On: 03/22/2017 08:41    GEX:BMWUXL sinus rhythm left axis deviation and right bundle-branch block  Weights: Filed Weights   04/01/17 0730  Weight: 69.6 kg (153 lb 6.4 oz)     Physical Exam: Blood pressure (!) 135/45, pulse 62, temperature 98 F (36.7 C), resp. rate 17, height  (1.575 m), weight 69.6 kg (153 lb 6.4 oz), SpO2 100 %. Body mass index is 28.06 kg/m. General: Well developed, well nourished, in no acute distress. Head eyes ears nose throat: Normocephalic, atraumatic, sclera  non-icteric, no xanthomas, nares are without discharge. No apparent thyromegaly and/or mass  Lungs: Normal respiratory effort.  no wheezes, no rales, no rhonchi.  Heart: RRR with normal S1 S2. no murmur gallop, no rub, PMI is normal size and placement, carotid upstroke normal without bruit, jugular venous pressure is normal Abdomen: Soft, non-tender, non-distended with normoactive bowel sounds. No hepatomegaly. No rebound/guarding. No obvious abdominal masses. Abdominal aorta is normal size without bruit Extremities: No edema. no cyanosis, no clubbing, no ulcers  Peripheral : 2+ bilateral upper extremity pulses, 2+ bilateral femoral pulses, 2+ bilateral dorsal pedal pulse Neuro: Alert and oriented. No facial asymmetry. No focal deficit. Moves all extremities spontaneously. Musculoskeletal: Normal muscle tone without kyphosis Psych:  Responds to questions appropriately with a normal affect.    Assessment: 83 year old female with previous significant stroke myocardial infarction LV systolic dysfunction at skilled nursing with atypical centralize musculoskeletal type chest discomfort without evidence of myocardial infarction on appropriate medication management  Plan: 1. No further cardiac diagnostics necessary at this time due to atypical chest pain and no current evidence of myocardial infarction 2. Isosorbide for chest discomfort and myocardial ischemia in the future although current chest pain not consistent with coronary disease 3. High intensity cholesterol therapy with atorvastatin for previous migraine infarction 4. Appropriate medication management for previous migraine infarction hypertension concordant oral including metoprolol lisinopril Minerva Areola dual antiplatelet therapy for previous smoker infarction 5. Begin ambulation and follow for worsening symptoms and possible discharged home as able  Signed, Lamar Blinks M.D. [facility] Cardiology 04/01/2017, 1:33 PM

## 2017-04-02 LAB — CBC
HCT: 33.8 % — ABNORMAL LOW (ref 35.0–47.0)
Hemoglobin: 11.9 g/dL — ABNORMAL LOW (ref 12.0–16.0)
MCH: 32 pg (ref 26.0–34.0)
MCHC: 35.2 g/dL (ref 32.0–36.0)
MCV: 90.8 fL (ref 80.0–100.0)
Platelets: 222 10*3/uL (ref 150–440)
RBC: 3.72 MIL/uL — ABNORMAL LOW (ref 3.80–5.20)
RDW: 13.6 % (ref 11.5–14.5)
WBC: 5.5 10*3/uL (ref 3.6–11.0)

## 2017-04-02 LAB — BASIC METABOLIC PANEL
Anion gap: 8 (ref 5–15)
BUN: 13 mg/dL (ref 6–20)
CO2: 26 mmol/L (ref 22–32)
Calcium: 9 mg/dL (ref 8.9–10.3)
Chloride: 101 mmol/L (ref 101–111)
Creatinine, Ser: 0.7 mg/dL (ref 0.44–1.00)
GFR calc Af Amer: >60 mL/min (ref >60)
GFR calc non Af Amer: >60 mL/min (ref >60)
Glucose, Bld: 115 mg/dL — ABNORMAL HIGH (ref 65–99)
Potassium: 4 mmol/L (ref 3.5–5.1)
Sodium: 135 mmol/L (ref 135–145)

## 2017-04-02 LAB — LIPID PANEL
CHOLESTEROL: 156 mg/dL (ref 0–200)
HDL: 36 mg/dL — ABNORMAL LOW (ref >40)
LDL Cholesterol: 65 mg/dL (ref 0–99)
Total CHOL/HDL Ratio: 4.3 RATIO
Triglycerides: 277 mg/dL — ABNORMAL HIGH (ref <150)
VLDL: 55 mg/dL — AB (ref 0–40)

## 2017-04-02 NOTE — Progress Notes (Signed)
Called report to facility nurse at this time. All questions answered. Paperwork completed. EMS called for non-emergent patient transport at this time as well. Alexandria Hanson [facility]

## 2017-04-02 NOTE — Clinical Social Work Note (Signed)
CSW contacted patient's daughter and informed her that patient will be discharging back to [facility] today.  CSW contacted [facility] and can accept patient back today.  Patient to be d/c'ed today to [facility].  Patient and family agreeable to plans will transport via ems RN to call report.  Ervin Knack. Ireene Ballowe, MSW, Theresia Majors [phone]  04/02/2017 11:45 AM

## 2017-04-02 NOTE — Clinical Social Work Note (Signed)
CSW received consult that patient is a long term care resident at [facility].  CSW to meet with patient and complete assessment at a later time.  Alexandria Hanson. Lemoine Goyne, MSW, Theresia Majors [phone]  04/02/2017 9:55 AM

## 2017-04-02 NOTE — Discharge Summary (Signed)
[employer] [facility] [place] [facility]   PATIENT NAME: Alexandria Hanson    MR#:  [id number]  DATE OF BIRTH:  February 28, 1929  DATE OF ADMISSION:  04/01/2017 ADMITTING PHYSICIAN: Ihor Austin, MD  DATE OF DISCHARGE: 04/02/2017  PRIMARY CARE PHYSICIAN: Gabriel Cirri, NP    ADMISSION DIAGNOSIS:  Chest pain, unspecified type [R07.9]  DISCHARGE DIAGNOSIS:  Active Problems:   Chest pain   SECONDARY DIAGNOSIS:   Past Medical History:  Diagnosis Date  . Anxiety   . Bleeding in brain [facility])    s/p fall  . CAD (coronary artery disease)    s/p MI 30 yrs ago  . Cerebral artery occlusion   . Depression   . Hypertension   . MI (mitral incompetence)   . Stroke (HCC) 2001  . Stroke [facility])     HOSPITAL COURSE:   83 year old female with history of chronic systolic heart failure ejection fraction of 30%, intracranial bleed, CVA and CAD who presents with chest pain.   1. Atypical chest pain: Patient was ruled out for acute coronary syndrome with negative troponins she had no abnormal heart rhythm noted on telemetry. She was about by cardiology. Chest pain is atypical in nature in that she had tenderness to palpation of her chest wall in the area that she was complaining of. No further cardiac diagnostics are necessary at this time.  2. History of CAD: Continue aspirin, atorvastatin, Plavix, isosorbide, metoprolol and lisinopril  3. Essential hypertension: Continue isosorbide, lisinopril, metoprolol  4. History CVA: Continue aspirin, Plavix and statin  5. Hyperlipidemia: Continue statin  6 depression: Continue Prozac  DISCHARGE CONDITIONS AND DIET:  Stable Cardiac diet  CONSULTS OBTAINED:  Treatment Team:  Lamar Blinks, MD  DRUG ALLERGIES:  No Known Allergies  DISCHARGE MEDICATIONS:   Current Discharge Medication List    CONTINUE these medications which have NOT CHANGED   Details  acetaminophen (TYLENOL) 325 MG tablet Take 650 mg by mouth every 4 (four) hours  as needed for mild pain or moderate pain.    Alum & Mag Hydroxide-Simeth (GI COCKTAIL) SUSP suspension Take 30 mLs by mouth 3 (three) times daily as needed for indigestion. Shake well. Qty: 300 mL, Refills: 0    aspirin 81 MG chewable tablet Chew 81 mg by mouth daily.    atorvastatin (LIPITOR) 40 MG tablet Take 1 tablet (40 mg total) by mouth daily at 6 PM.    clopidogrel (PLAVIX) 75 MG tablet Take 1 tablet (75 mg total) by mouth daily.    FLUoxetine (PROZAC) 10 MG capsule Take 10 mg by mouth daily.    guaifenesin (ROBITUSSIN) 100 MG/5ML syrup Take 100 mg by mouth every 4 (four) hours as needed for cough.    hydrochlorothiazide (HYDRODIURIL) 25 MG tablet Take 1 tablet (25 mg total) by mouth daily. Qty: 30 tablet, Refills: 1    isosorbide mononitrate (IMDUR) 30 MG 24 hr tablet Take 30 mg by mouth daily.    lisinopril (PRINIVIL,ZESTRIL) 5 MG tablet Take 5 mg by mouth daily.    metoCLOPramide (REGLAN) 5 MG tablet Take 5 mg by mouth every evening.    metoprolol tartrate (LOPRESSOR) 25 MG tablet Take 1 tablet (25 mg total) by mouth 2 (two) times daily. Qty: 60 tablet, Refills: 1    Multiple Vitamin (MULTIVITAMIN) tablet Take 1 tablet by mouth daily.    nitroGLYCERIN (NITROLINGUAL) 0.4 MG/SPRAY spray Place 1 spray under the tongue every 5 (five) minutes x 3 doses as needed for chest  pain.    pantoprazole (PROTONIX) 40 MG tablet Take 40 mg by mouth daily.          Today   CHIEF COMPLAINT:   Patient denies chest pain this morning. No acute events reported overnight.   VITAL SIGNS:  Blood pressure (!) 148/53, pulse 71, temperature 97.9 F (36.6 C), temperature source Oral, resp. rate 18, height  (1.575 m), weight 69.6 kg (153 lb 6.4 oz), SpO2 96 %.   REVIEW OF SYSTEMS:  Review of Systems  Constitutional: Negative.  Negative for chills, fever and malaise/fatigue.  HENT: Negative.  Negative for ear discharge, ear pain, hearing loss, nosebleeds and sore throat.   Eyes:  Negative.  Negative for blurred vision and pain.  Respiratory: Negative.  Negative for cough, hemoptysis, shortness of breath and wheezing.   Cardiovascular: Negative.  Negative for chest pain, palpitations and leg swelling.  Gastrointestinal: Negative.  Negative for abdominal pain, blood in stool, diarrhea, nausea and vomiting.  Genitourinary: Negative.  Negative for dysuria.  Musculoskeletal: Negative.  Negative for back pain.  Skin: Negative.   Neurological: Negative for dizziness, tremors, speech change, focal weakness, seizures and headaches.  Endo/Heme/Allergies: Negative.  Does not bruise/bleed easily.  Psychiatric/Behavioral: Positive for memory loss. Negative for depression, hallucinations and suicidal ideas.     PHYSICAL EXAMINATION:  GENERAL:  83 y.o.-year-old patient lying in the bed with no acute distress.  NECK:  Supple, no jugular venous distention. No thyroid enlargement, no tenderness.  LUNGS: Normal breath sounds bilaterally, no wheezing, rales,rhonchi  No use of accessory muscles of respiration.  CARDIOVASCULAR: S1, S2 normal. No murmurs, rubs, or gallops.  She has tenderness to palpation of the chest wall ABDOMEN: Soft, non-tender, non-distended. Bowel sounds present. No organomegaly or mass.  EXTREMITIES: No pedal edema, cyanosis, or clubbing. Right sided weakness at baseline PSYCHIATRIC: The patient is alert and oriented x 3.  SKIN: No obvious rash, lesion, or ulcer.   DATA REVIEW:   CBC  Recent Labs Lab 04/02/17 0517  WBC 5.5  HGB 11.9*  HCT 33.8*  PLT 222    Chemistries   Recent Labs Lab 04/02/17 0517  NA 135  K 4.0  CL 101  CO2 26  GLUCOSE 115*  BUN 13  CREATININE 0.70  CALCIUM 9.0    Cardiac Enzymes  Recent Labs Lab 04/01/17 0409 04/01/17 1112 04/01/17 1609  TROPONINI <0.03 <0.03 <0.03    Microbiology Results  @  RADIOLOGY:  Dg Chest Portable 1 View  Result Date: 04/01/2017 CLINICAL DATA:  Midsternal chest pain  beginning an hour ago. Now at baseline. Assess for CHF for pneumonia. EXAM: PORTABLE CHEST 1 VIEW COMPARISON:  Chest radiograph March 22, 2017 FINDINGS: Cardiac silhouette is mildly enlarged and unchanged. Mediastinal silhouette is nonsuspicious, mildly calcified aortic knob. No pleural effusion or focal consolidation. No pneumothorax. Soft tissue planes included osseous structures are unchanged. Severe cervical facet arthropathy. Faint vascular calcifications in LEFT neck. IMPRESSION: Stable cardiomegaly, no acute pulmonary process. Aortic Atherosclerosis (ICD10-I70.0). Electronically Signed   By: Awilda Metro M.D.   On: 04/01/2017 04:45      Current Discharge Medication List    CONTINUE these medications which have NOT CHANGED   Details  acetaminophen (TYLENOL) 325 MG tablet Take 650 mg by mouth every 4 (four) hours as needed for mild pain or moderate pain.    Alum & Mag Hydroxide-Simeth (GI COCKTAIL) SUSP suspension Take 30 mLs by mouth 3 (three) times daily as needed for indigestion. Shake well. Qty: 300  mL, Refills: 0    aspirin 81 MG chewable tablet Chew 81 mg by mouth daily.    atorvastatin (LIPITOR) 40 MG tablet Take 1 tablet (40 mg total) by mouth daily at 6 PM.    clopidogrel (PLAVIX) 75 MG tablet Take 1 tablet (75 mg total) by mouth daily.    FLUoxetine (PROZAC) 10 MG capsule Take 10 mg by mouth daily.    guaifenesin (ROBITUSSIN) 100 MG/5ML syrup Take 100 mg by mouth every 4 (four) hours as needed for cough.    hydrochlorothiazide (HYDRODIURIL) 25 MG tablet Take 1 tablet (25 mg total) by mouth daily. Qty: 30 tablet, Refills: 1    isosorbide mononitrate (IMDUR) 30 MG 24 hr tablet Take 30 mg by mouth daily.    lisinopril (PRINIVIL,ZESTRIL) 5 MG tablet Take 5 mg by mouth daily.    metoCLOPramide (REGLAN) 5 MG tablet Take 5 mg by mouth every evening.    metoprolol tartrate (LOPRESSOR) 25 MG tablet Take 1 tablet (25 mg total) by mouth 2 (two) times daily. Qty: 60  tablet, Refills: 1    Multiple Vitamin (MULTIVITAMIN) tablet Take 1 tablet by mouth daily.    nitroGLYCERIN (NITROLINGUAL) 0.4 MG/SPRAY spray Place 1 spray under the tongue every 5 (five) minutes x 3 doses as needed for chest pain.    pantoprazole (PROTONIX) 40 MG tablet Take 40 mg by mouth daily.           Management plans discussed with the patient and she is in agreement. Stable for discharge   Patient should follow up with pcp  CODE STATUS:     Code Status Orders        Start     Ordered   04/01/17 0611  Do not attempt resuscitation (DNR)  Continuous    Question Answer Comment  In the event of cardiac or respiratory ARREST Do not call a "code blue"   In the event of cardiac or respiratory ARREST Do not perform Intubation, CPR, defibrillation or ACLS   In the event of cardiac or respiratory ARREST Use medication by any route, position, wound care, and other measures to relive pain and suffering. May use oxygen, suction and manual treatment of airway obstruction as needed for comfort.      04/01/17 0610    Code Status History    Date Active Date Inactive Code Status Order ID Comments User Context   03/22/2017  3:33 PM 03/23/2017  8:30 PM Full Code [id number]  Marguarite Arbour, MD ED   03/22/2017  2:15 PM 03/22/2017  3:33 PM DNR [id number]  Marguarite Arbour, MD ED   01/26/2017  7:47 AM 01/28/2017  9:46 PM DNR [id number]  Arnaldo Natal, MD Inpatient   03/07/2016  8:04 PM 03/08/2016 11:21 AM DNR [id number]  Alford Highland, MD ED    Advance Directive Documentation     Most Recent Value  Type of Advance Directive  Out of facility DNR (pink MOST or yellow form)  Pre-existing out of facility DNR order (yellow form or pink MOST form)  Physician notified to receive inpatient order  "MOST" Form in Place?  -      TOTAL TIME TAKING CARE OF THIS PATIENT: 37 minutes.    Note: This dictation was prepared with Dragon dictation along with smaller phrase technology. Any transcriptional  errors that result from this process are unintentional.  Leyani Gargus M.D on 04/02/2017 at 9:36 AM  Between 7am to 6pm - Pager - [phone] After 6pm go to [email or web address] -  password [email or web address] [place] [facility]  Office  [phone]  CC: Primary care physician; Kathrine Haddock, NP

## 2017-04-02 NOTE — Discharge Instructions (Signed)

## 2017-04-02 NOTE — Clinical Social Work Note (Signed)
Clinical Social Work Assessment  Patient Details  Name: Alexandria Hanson MRN: [id number] Date of Birth: 1928-12-15  Date of referral:  04/02/17               Reason for consult:  Facility Placement                Permission sought to share information with:  Family Supports Permission granted to share information::  Yes, Verbal Permission Granted  Name::     Alvan Dame Daughter [phone]  [phone]   Agency::  SNF admissions  Relationship::     Contact Information:     Housing/Transportation Living arrangements for the past 2 months:  Skilled Nursing Facility Source of Information:  Adult Children, Medical Team Patient Interpreter Needed:  None Criminal Activity/Legal Involvement Pertinent to Current Situation/Hospitalization:  No - Comment as needed Significant Relationships:  Adult Children Lives with:  Facility Resident Do you feel safe going back to the place where you live?  Yes Need for family participation in patient care:  Yes (Comment)  Care giving concerns: Patient's family did not express any concerns about returning back to SNF.   Social Worker assessment / plan:  Patient is an 83 year old female who is alert and oriented x1.  Patient is a long term care resident at [facility] in [place].  Patient has dementia, CSW spoke to patient's daughter to complete assessment.  Patient's daughter expressed that she has been pleased with the care that is being provided at [facility].  Patient's daughter expressed they would like her to return back to SNF.  Patient's daughter was explained role of social worker and process to have patient return back to SNF.  Patient's daughter did not express any other issues or concerns.   Employment status:  Retired Database administrator PT Recommendations:  Not assessed at this time Information / Referral to community resources:  Skilled Nursing Facility  Patient/Family's Response to care:  Patient's family  would like her to return back to SNF.  Patient/Family's Understanding of and Emotional Response to Diagnosis, Current Treatment, and Prognosis: Patient's family is aware of current treatment plan and prognosis.  Emotional Assessment Appearance:  Appears stated age Attitude/Demeanor/Rapport:    Affect (typically observed):  Calm, Appropriate Orientation:  Oriented to Self Alcohol / Substance use:  Not Applicable Psych involvement (Current and /or in the community):  No (Comment)  Discharge Needs  Concerns to be addressed:  Care Coordination Readmission within the last 30 days:  No Current discharge risk:    Barriers to Discharge:  Continued Medical Work up   Darleene Cleaver, LCSWA 04/02/2017, 3:04 PM

## 2017-04-02 NOTE — Progress Notes (Signed)
PT Cancellation Note  Patient Details Name: Alexandria Hanson MRN: [id number] DOB: Jul 18, 1928   Cancelled Treatment:    Reason Eval/Treat Not Completed: Other (comment). Consult received and chart reviewed. Plan for pt to dc back to LTC. No indication for PT consult at this time. Spoke and confirmed with RN. Will dc in house. Please re-consult if needs change.   Akeela Busk 04/02/2017, 11:43 AM Elizabeth Palau, PT, DPT [phone]

## 2017-04-02 NOTE — NC FL2 (Signed)
[place] MEDICAID FL2 LEVEL OF CARE SCREENING TOOL     IDENTIFICATION  Patient Name: Alexandria Hanson Birthdate: [birth year] Sex: female Admission Date (Current Location): 04/01/2017  [place] Number:  Chiropodist and Address:  [facility], [address] [place], [place] [address]      Provider Number: [id number]  Attending Physician Name and Address:  Adrian Saran, MD  Relative Name and Phone Number:  Jennette Dubin    [phone] or Alita Chyle   [phone] or Murphy,Nancy Daughter [phone]  [phone] or Hulda Humphrey   [phone]     Current Level of Care: Hospital Recommended Level of Care: Skilled Nursing Facility Prior Approval Number:    Date Approved/Denied:   PASRR Number: [id number]  Discharge Plan: SNF    Current Diagnoses: Patient Active Problem List   Diagnosis Date Noted  . Elevated troponin 03/22/2017  . ASCVD (arteriosclerotic cardiovascular disease) 03/22/2017  . Chest pain 01/26/2017  . NSTEMI (non-ST elevated myocardial infarction) (HCC) 01/26/2017  . Stroke (HCC) 03/08/2016  . Depression   . Anxiety   . Hypertension     Orientation RESPIRATION BLADDER Height & Weight     Self    Incontinent Weight: 153 lb 6.4 oz (69.6 kg) Height:   (157.5 cm)  BEHAVIORAL SYMPTOMS/MOOD NEUROLOGICAL BOWEL NUTRITION STATUS      Incontinent Diet (Cardiac)  AMBULATORY STATUS COMMUNICATION OF NEEDS Skin   Limited Assist Verbally Normal                       Personal Care Assistance Level of Assistance  Bathing, Feeding, Dressing Bathing Assistance: Limited assistance Feeding assistance: Limited assistance Dressing Assistance: Limited assistance     Functional Limitations Info  Sight, Hearing, Speech Sight Info: Adequate Hearing Info: Adequate Speech Info: Adequate    SPECIAL CARE FACTORS FREQUENCY                       Contractures Contractures Info: Not present     Additional Factors Info  Code Status, Allergies, Psychotropic Code Status Info: DNR Allergies Info: NKA Psychotropic Info: FLUoxetine (PROZAC) capsule 10 mg         Current Medications (04/02/2017):  This is the current hospital active medication list Current Facility-Administered Medications  Medication Dose Route Frequency Provider Last Rate Last Dose  . 0.9 %  sodium chloride infusion  250 mL Intravenous PRN Pyreddy, Vivien Rota, MD      . acetaminophen (TYLENOL) tablet 650 mg  650 mg Oral Q4H PRN Ihor Austin, MD   650 mg at 04/01/17 1610  . aspirin EC tablet 81 mg  81 mg Oral Daily Pyreddy, Pavan, MD      . atorvastatin (LIPITOR) tablet 40 mg  40 mg Oral q1800 Ihor Austin, MD   40 mg at 04/01/17 1756  . clopidogrel (PLAVIX) tablet 75 mg  75 mg Oral Daily Ihor Austin, MD   75 mg at 04/01/17 1207  . enoxaparin (LOVENOX) injection 40 mg  40 mg Subcutaneous Q24H Ihor Austin, MD   40 mg at 04/01/17 2014  . FLUoxetine (PROZAC) capsule 10 mg  10 mg Oral Daily Pyreddy, Vivien Rota, MD   10 mg at 04/01/17 1206  . gi cocktail (Maalox,Lidocaine,Donnatal)  30 mL Oral TID PRN Ihor Austin, MD      . guaifenesin (ROBITUSSIN) 100 MG/5ML syrup 100 mg  100 mg Oral Q4H PRN Ihor Austin, MD      . isosorbide  mononitrate (IMDUR) 24 hr tablet 30 mg  30 mg Oral Daily Pyreddy, Vivien Rota, MD   30 mg at 04/01/17 1206  . lisinopril (PRINIVIL,ZESTRIL) tablet 5 mg  5 mg Oral Daily Pyreddy, Vivien Rota, MD   5 mg at 04/01/17 1206  . metoCLOPramide (REGLAN) tablet 5 mg  5 mg Oral QPM Pyreddy, Pavan, MD   5 mg at 04/01/17 1755  . metoprolol tartrate (LOPRESSOR) tablet 25 mg  25 mg Oral BID Ihor Austin, MD   25 mg at 04/01/17 2134  . multivitamin with minerals tablet 1 tablet  1 tablet Oral Daily Pyreddy, Pavan, MD      . nitroGLYCERIN (NITROSTAT) SL tablet 0.4 mg  0.4 mg Sublingual Q5 Min x 3 PRN Pyreddy, Pavan, MD      . ondansetron (ZOFRAN) injection 4 mg  4 mg Intravenous Q6H PRN Pyreddy, Pavan, MD      .  pantoprazole (PROTONIX) EC tablet 40 mg  40 mg Oral BID Alford Highland, MD   40 mg at 04/01/17 2137  . sodium chloride flush (NS) 0.9 % injection 3 mL  3 mL Intravenous Q12H Ihor Austin, MD   3 mL at 04/01/17 2134  . sodium chloride flush (NS) 0.9 % injection 3 mL  3 mL Intravenous PRN Ihor Austin, MD         Discharge Medications: Please see discharge summary for a list of discharge medications.  Relevant Imaging Results:  Relevant Lab Results:   Additional Information SSN:  [id number]  [place] Constable

## 2017-04-07 ENCOUNTER — Inpatient Hospital Stay: Payer: Medicare Other | Admitting: Unknown Physician Specialty

## 2017-04-23 ENCOUNTER — Other Ambulatory Visit: Payer: Self-pay | Admitting: Internal Medicine

## 2017-04-23 DIAGNOSIS — R1013 Epigastric pain: Secondary | ICD-10-CM

## 2017-04-28 ENCOUNTER — Ambulatory Visit
Admission: RE | Admit: 2017-04-28 | Discharge: 2017-04-28 | Disposition: A | Discharge status: Home / Self Care | Payer: Medicare Other | Source: Ambulatory Visit | Attending: Internal Medicine | Admitting: Internal Medicine

## 2017-04-28 DIAGNOSIS — R1013 Epigastric pain: Secondary | ICD-10-CM | POA: Insufficient documentation

## 2017-04-28 DIAGNOSIS — K449 Diaphragmatic hernia without obstruction or gangrene: Secondary | ICD-10-CM | POA: Diagnosis not present

## 2017-09-25 ENCOUNTER — Emergency Department: Payer: Medicare Other

## 2017-09-25 ENCOUNTER — Encounter: Payer: Self-pay | Admitting: Emergency Medicine

## 2017-09-25 ENCOUNTER — Emergency Department
Admission: EM | Admit: 2017-09-25 | Discharge: 2017-09-25 | Disposition: A | Discharge status: Skilled Nursing Facility | Payer: Medicare Other | Attending: Emergency Medicine | Admitting: Emergency Medicine

## 2017-09-25 DIAGNOSIS — R079 Chest pain, unspecified: Secondary | ICD-10-CM

## 2017-09-25 DIAGNOSIS — Z79899 Other long term (current) drug therapy: Secondary | ICD-10-CM | POA: Diagnosis not present

## 2017-09-25 DIAGNOSIS — I2510 Atherosclerotic heart disease of native coronary artery without angina pectoris: Secondary | ICD-10-CM | POA: Insufficient documentation

## 2017-09-25 DIAGNOSIS — Z7902 Long term (current) use of antithrombotics/antiplatelets: Secondary | ICD-10-CM | POA: Insufficient documentation

## 2017-09-25 DIAGNOSIS — R1013 Epigastric pain: Secondary | ICD-10-CM | POA: Diagnosis not present

## 2017-09-25 DIAGNOSIS — Z7982 Long term (current) use of aspirin: Secondary | ICD-10-CM | POA: Diagnosis not present

## 2017-09-25 DIAGNOSIS — I10 Essential (primary) hypertension: Secondary | ICD-10-CM | POA: Diagnosis not present

## 2017-09-25 LAB — CBC
HCT: 33.7 % — ABNORMAL LOW (ref 35.0–47.0)
Hemoglobin: 11.8 g/dL — ABNORMAL LOW (ref 12.0–16.0)
MCH: 32 pg (ref 26.0–34.0)
MCHC: 35.1 g/dL (ref 32.0–36.0)
MCV: 91.2 fL (ref 80.0–100.0)
PLATELETS: 219 10*3/uL (ref 150–440)
RBC: 3.69 MIL/uL — ABNORMAL LOW (ref 3.80–5.20)
RDW: 12.8 % (ref 11.5–14.5)
WBC: 6.4 10*3/uL (ref 3.6–11.0)

## 2017-09-25 LAB — COMPREHENSIVE METABOLIC PANEL
ALT: 17 U/L (ref 14–54)
ANION GAP: 11 (ref 5–15)
AST: 27 U/L (ref 15–41)
Albumin: 3.6 g/dL (ref 3.5–5.0)
Alkaline Phosphatase: 60 U/L (ref 38–126)
BILIRUBIN TOTAL: 1.8 mg/dL — AB (ref 0.3–1.2)
BUN: 12 mg/dL (ref 6–20)
CO2: 25 mmol/L (ref 22–32)
Calcium: 9 mg/dL (ref 8.9–10.3)
Chloride: 97 mmol/L — ABNORMAL LOW (ref 101–111)
Creatinine, Ser: 0.85 mg/dL (ref 0.44–1.00)
Glucose, Bld: 136 mg/dL — ABNORMAL HIGH (ref 65–99)
POTASSIUM: 3.4 mmol/L — AB (ref 3.5–5.1)
Sodium: 133 mmol/L — ABNORMAL LOW (ref 135–145)
TOTAL PROTEIN: 7 g/dL (ref 6.5–8.1)

## 2017-09-25 LAB — TROPONIN I

## 2017-09-25 LAB — LIPASE, BLOOD: LIPASE: 23 U/L (ref 11–51)

## 2017-09-25 MED ORDER — GI COCKTAIL ~~LOC~~
30.0000 mL | Freq: Once | ORAL | Status: AC
  Administered 2017-09-25: 30 mL via ORAL
  Filled 2017-09-25: qty 30

## 2017-09-25 NOTE — ED Notes (Signed)
Pt unable to hold topez pen to sign discharge. Discharge instructions reviewed with pt and staff at [facility].

## 2017-09-25 NOTE — ED Triage Notes (Signed)
Pt arrived via EMS from [facility] facility with reports of central chest burning that started last night and has continued throughout the AM. Facility reported to EMS that pt was given 3 sublingual Nitro without relief. Pt has nitro patch on left chest, facility unknown when placed. Pt belching during triage.

## 2017-09-25 NOTE — ED Notes (Addendum)
NP from facility called and stated that a GI cocktail helps pts pain and that it is ordered on her MAR at [facility]

## 2017-09-25 NOTE — ED Notes (Signed)
Report given to Norberta KeensJulie RN at [facility].

## 2017-09-25 NOTE — ED Provider Notes (Signed)
[facility] Emergency Department Provider Note   ____________________________________________   First MD Initiated Contact with Patient 09/25/17 [phone]     (approximate)  I have reviewed the triage vital signs and the nursing notes.   HISTORY  Chief Complaint Chest Pain    HPI Toryn Dewalt is a 84 y.o. female brought to the ED from [facility] nursing facility with a chief complaint of central chest burning.  Patient has a history of CAD status post MI, patient reports substernal chest burning which began last evening and continued throughout the night.  Patient was already wearing a nitroglycerin patch and staff members gave additional 3 sublingual nitroglycerin without relief of symptoms.  Patient endorses slight nausea with burning sensation.  Denies associated diaphoresis, shortness of breath, vomiting, palpitations or dizziness.  Denies recent fever, chills, cough, congestion, dysuria, diarrhea.  Denies recent trauma.   Past Medical History:  Diagnosis Date  . Anxiety   . Bleeding in brain [facility])    s/p fall  . CAD (coronary artery disease)    s/p MI 30 yrs ago  . Cerebral artery occlusion   . Depression   . Hypertension   . MI (mitral incompetence)   . Stroke (HCC) 2001  . Stroke [facility])     Patient Active Problem List   Diagnosis Date Noted  . Elevated troponin 03/22/2017  . ASCVD (arteriosclerotic cardiovascular disease) 03/22/2017  . Chest pain 01/26/2017  . NSTEMI (non-ST elevated myocardial infarction) (HCC) 01/26/2017  . Stroke (HCC) 03/08/2016  . Depression   . Anxiety   . Hypertension     Past Surgical History:  Procedure Laterality Date  . ABDOMINAL HYSTERECTOMY    . APPENDECTOMY    . CHOLECYSTECTOMY    . COLON SURGERY     knot on colon removed    Prior to Admission medications   Medication Sig Start Date End Date Taking? Authorizing Provider  acetaminophen (TYLENOL) 325 MG tablet Take 650 mg by mouth every 4 (four) hours  as needed for mild pain or moderate pain.    [provider]  Alum & Mag Hydroxide-Simeth (GI COCKTAIL) SUSP suspension Take 30 mLs by mouth 3 (three) times daily as needed for indigestion. Shake well. 01/28/17   Shaune Pollack, MD  aspirin 81 MG chewable tablet Chew 81 mg by mouth daily.    [provider]  atorvastatin (LIPITOR) 40 MG tablet Take 1 tablet (40 mg total) by mouth daily at 6 PM. 03/11/16   Auburn Bilberry, MD  clopidogrel (PLAVIX) 75 MG tablet Take 1 tablet (75 mg total) by mouth daily. 03/11/16   Auburn Bilberry, MD  FLUoxetine (PROZAC) 10 MG capsule Take 10 mg by mouth daily.    [provider]  guaifenesin (ROBITUSSIN) 100 MG/5ML syrup Take 100 mg by mouth every 4 (four) hours as needed for cough.    [provider]  hydrochlorothiazide (HYDRODIURIL) 25 MG tablet Take 1 tablet (25 mg total) by mouth daily. 01/18/16   Gabriel Cirri, NP  isosorbide mononitrate (IMDUR) 30 MG 24 hr tablet Take 30 mg by mouth daily.    [provider]  lisinopril (PRINIVIL,ZESTRIL) 5 MG tablet Take 5 mg by mouth daily.    [provider]  metoCLOPramide (REGLAN) 5 MG tablet Take 5 mg by mouth every evening.    [provider]  metoprolol tartrate (LOPRESSOR) 25 MG tablet Take 1 tablet (25 mg total) by mouth 2 (two) times daily. 01/28/17   Shaune Pollack, MD  Multiple  Vitamin (MULTIVITAMIN) tablet Take 1 tablet by mouth daily.    [provider]  nitroGLYCERIN (NITROLINGUAL) 0.4 MG/SPRAY spray Place 1 spray under the tongue every 5 (five) minutes x 3 doses as needed for chest pain.    [provider]  pantoprazole (PROTONIX) 40 MG tablet Take 40 mg by mouth daily.    [provider]    Allergies Patient has no known allergies.  Family History  Problem Relation Age of Onset  . CVA Mother   . CAD Father   . CAD Sister   . Hyperlipidemia Mother   . Hypertension Mother   . Diabetes Sister   . Hypertension Sister      Social History Social History   Tobacco Use  . Smoking status: Never Smoker  . Smokeless tobacco: Never Used  Substance Use Topics  . Alcohol use: No  . Drug use: No    Review of Systems  Constitutional: No fever/chills. Eyes: No visual changes. ENT: No sore throat. Cardiovascular: Positive for chest pain. Respiratory: Denies shortness of breath. Gastrointestinal: No abdominal pain.  Positive for nausea, no vomiting.  No diarrhea.  No constipation. Genitourinary: Negative for dysuria. Musculoskeletal: Negative for back pain. Skin: Negative for rash. Neurological: Negative for headaches, focal weakness or numbness.   ____________________________________________   PHYSICAL EXAM:  VITAL SIGNS: ED Triage Vitals [09/25/17 0545]  Enc Vitals Group     BP      Pulse      Resp      Temp      Temp src      SpO2      Weight 153 lb (69.4 kg)     Height      Head Circumference      Peak Flow      Pain Score      Pain Loc      Pain Edu?      Excl. in GC?     Constitutional: Alert and oriented. Well appearing and in no acute distress. Eyes: Conjunctivae are normal. PERRL. EOMI. Head: Atraumatic. Nose: No congestion/rhinnorhea. Mouth/Throat: Mucous membranes are moist.  Oropharynx non-erythematous. Neck: No stridor.   Cardiovascular: Normal rate, regular rhythm. Grossly normal heart sounds.  Good peripheral circulation. Respiratory: Normal respiratory effort.  No retractions. Lungs CTAB. Gastrointestinal: Soft and mildly tender to palpation epigastrium without rebound or guarding. No distention. No abdominal bruits. No CVA tenderness. Musculoskeletal: No lower extremity tenderness nor edema.  No joint effusions. Neurologic:  Normal speech and language. No gross focal neurologic deficits are appreciated.  Mild dysarthria from prior stroke. Skin:  Skin is warm, dry and intact. No rash noted. Psychiatric: Mood and affect are normal. Speech and behavior are  normal.  ____________________________________________   LABS (all labs ordered are listed, but only abnormal results are displayed)  Labs Reviewed  CBC - Abnormal; Notable for the following components:      Result Value   RBC 3.69 (*)    Hemoglobin 11.8 (*)    HCT 33.7 (*)    All other components within normal limits  COMPREHENSIVE METABOLIC PANEL - Abnormal; Notable for the following components:   Sodium 133 (*)    Potassium 3.4 (*)    Chloride 97 (*)    Glucose, Bld 136 (*)    Total Bilirubin 1.8 (*)    All other components within normal limits  TROPONIN I  LIPASE, BLOOD   ____________________________________________  EKG  ED ECG REPORT I, Altonio Schwertner J, the attending physician, personally  viewed and interpreted this ECG.   Date: 09/25/2017  EKG Time: 0549  Rate: 73  Rhythm: normal EKG, normal sinus rhythm  Axis: Normal  Intervals:left anterior fascicular block  ST&T Change: Nonspecific  ____________________________________________  RADIOLOGY  ED MD interpretation: No acute cardiopulmonary process  Official radiology report(s): Dg Chest Portable 1 View  Result Date: 09/25/2017 CLINICAL DATA:  84 year old female with shortness of breath. EXAM: PORTABLE CHEST 1 VIEW COMPARISON:  Chest radiograph dated 04/01/2017 FINDINGS: The lungs are clear. There is no pleural effusion or pneumothorax. Stable mild cardiomegaly. No acute osseous pathology. IMPRESSION: No active disease. Electronically Signed   By: Elgie Collard M.D.   On: 09/25/2017 06:27    ____________________________________________   PROCEDURES  Procedure(s) performed: None  Procedures  Critical Care performed: No  ____________________________________________   INITIAL IMPRESSION / ASSESSMENT AND PLAN / ED COURSE  As part of my medical decision making, I reviewed the following data within the electronic MEDICAL RECORD NUMBER Nursing notes reviewed and incorporated, Labs reviewed, EKG interpreted,  Old chart reviewed, Radiograph reviewed and Notes from prior ED visits   84 year old female sent from skilled nursing facility for chest pain/burning sensation. Differential diagnosis includes, but is not limited to, ACS, aortic dissection, pulmonary embolism, cardiac tamponade, pneumothorax, pneumonia, pericarditis, myocarditis, GI-related causes including esophagitis/gastritis, and musculoskeletal chest wall pain.    Review of patient's chart demonstrate she had non-STEMI in December 2018 with subsequent GI workup for recurrent chest/epigastric burning sensation.  She underwent abdominal angiogram revealing no clotting of the mesenteric or celiac vessels.  She had subsequent upper GI series which demonstrated tiny hiatal hernia without evidence of GERD.  Serum H. pylori antibody test was negative.  Diagnosed with nonulcer dyspepsia by GI.  It is noted in the records that her symptoms improved with GI cocktail.  It is listed on her MAR but not given tonight.  Will administer GI cocktail.  Clinical Course as of Sep 25 720  Fri Sep 25, 2017  0628 Nurse practitioner from skilled nursing facility called to suggest we give patient a GI cocktail as that usually alleviates patient's symptoms.   [JS]  0701 Pain resolved after GI cocktail. Noted elevated bilirubin which is stable from 6 months ago.  Do not feel repeat troponin is necessary as this seems to be a frequent occurrence for the patient.  Strict return precautions given. Patient verbalizes understanding and agrees with plan of care.   [JS]    Clinical Course User Index [JS] Irean Hong, MD     ____________________________________________   FINAL CLINICAL IMPRESSION(S) / ED DIAGNOSES  Final diagnoses:  Nonspecific chest pain  Dyspepsia     ED Discharge Orders    None       Note:  This document was prepared using Dragon voice recognition software and may include unintentional dictation errors.    Irean Hong, MD 09/25/17  [phone]

## 2017-09-25 NOTE — Discharge Instructions (Addendum)
Return to the ER for worsening symptoms, persistent vomiting, difficulty breathing or other concerns. °

## 2017-09-25 NOTE — ED Notes (Signed)
Pt points to the middle of her chest/ upper abd when asked where the pain is located

## 2017-09-25 NOTE — ED Notes (Signed)
Skin under breasts red and broken down. Barrier cream applied.

## 2017-10-28 IMAGING — MR MR MRA HEAD W/O CM
10 of 11 series · 36 of 48 positions shown · non-contrast
Comparison: None.

CLINICAL DATA: Abnormal speech and right upper extremity weakness
after a fall at [DATE] a.m. today. Recent headaches.

EXAM:
MRI HEAD WITHOUT CONTRAST
MRA HEAD WITHOUT CONTRAST
TECHNIQUE: Multiplanar, multiecho pulse sequences of the brain and surrounding
structures were obtained without intravenous contrast. Angiographic
images of the head were obtained using MRA technique without
contrast.

[Series 2: T1 · sagittal · 5.0mm · 0.45mm/px · 3 of 27 slices shown (1 of 2)]
[im 1/27]
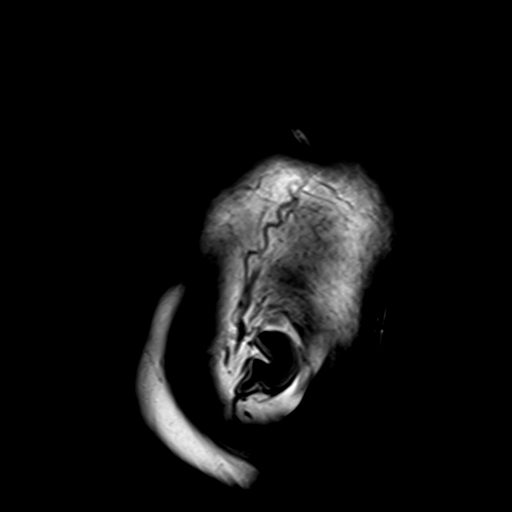
[im 14/27]
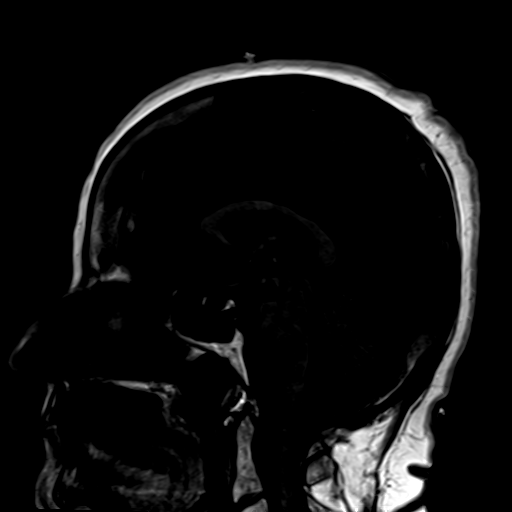
[im 27/27]
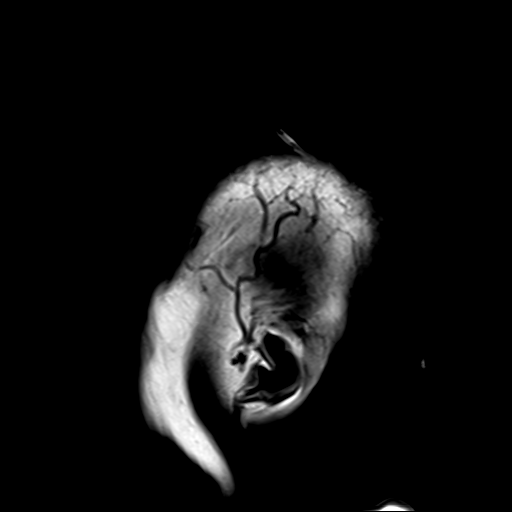

[Series 4: DWI · axial · 3.0mm · 1.80mm/px · z∈[-39,+119]mm · 6 of 55 slices shown (1 of 2)]
[im 1/55]
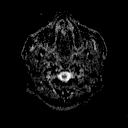
[im 11/55]
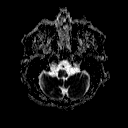
[im 22/55]
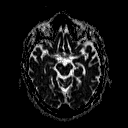
[im 33/55]
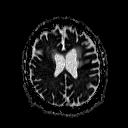
[im 44/55]
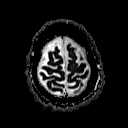
[im 55/55]
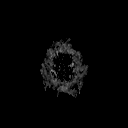

[Series 6: DWI · coronal · 3.0mm · 1.80mm/px · 4 of 45 slices shown (2 of 2)]
[im 1/45]
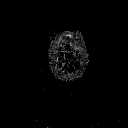
[im 15/45]
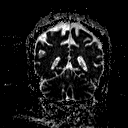
[im 30/45]
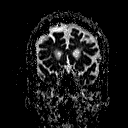
[im 45/45]
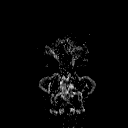

[Series 7: T2 · axial · 5.0mm · 0.60mm/px · z∈[-36,+116]mm · 2 of 25 slices shown (1 of 3)]
[im 1/25]
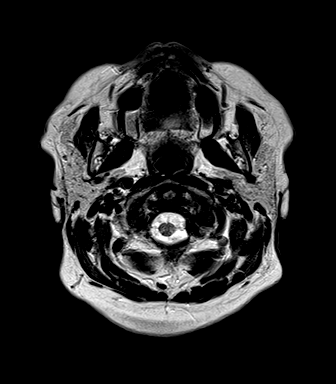
[im 25/25]
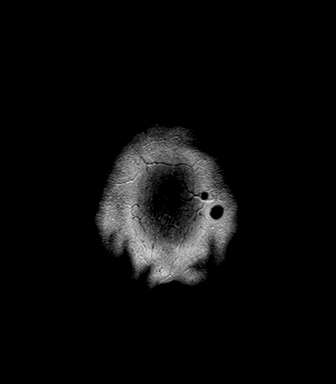

[Series 8: FLAIR · axial · 5.0mm · 0.45mm/px · z∈[-36,+116]mm · 2 of 25 slices shown]
[im 1/25]
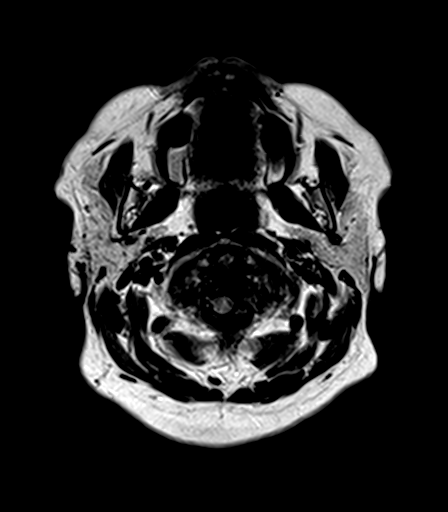
[im 25/25]
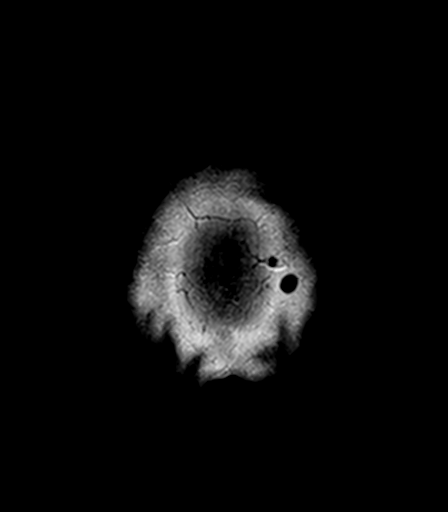

[Series 9: T2 · axial · 5.0mm · 0.45mm/px · z∈[-36,+116]mm · 2 of 25 slices shown (2 of 3)]
[im 1/25]
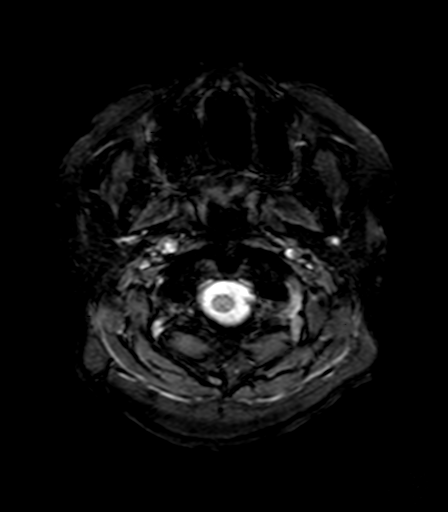
[im 25/25]
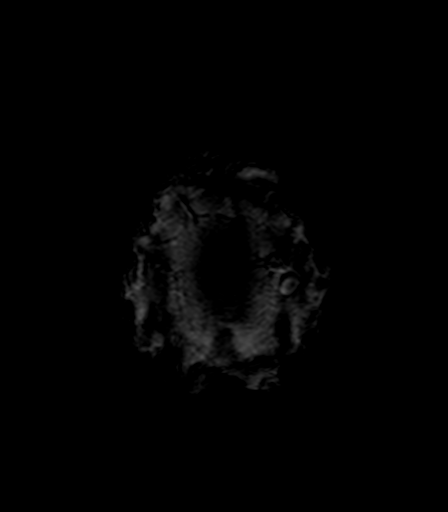

[Series 10: T1 · axial · 3.0mm · 1.00mm/px · z∈[-44,+129]mm · 5 of 60 slices shown (2 of 2)]
[im 1/60]
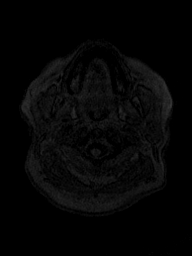
[im 15/60]
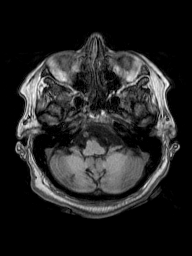
[im 30/60]
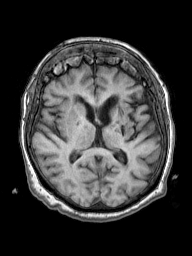
[im 45/60]
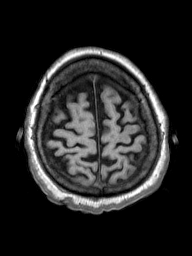
[im 60/60]
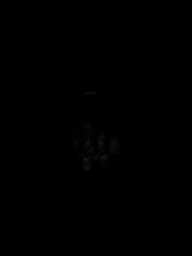

[Series 11: TOF · axial · non-contrast · 0.7mm · 0.37mm/px · z∈[-40,+58]mm · 8 of 143 slices shown]
[im 1/143]
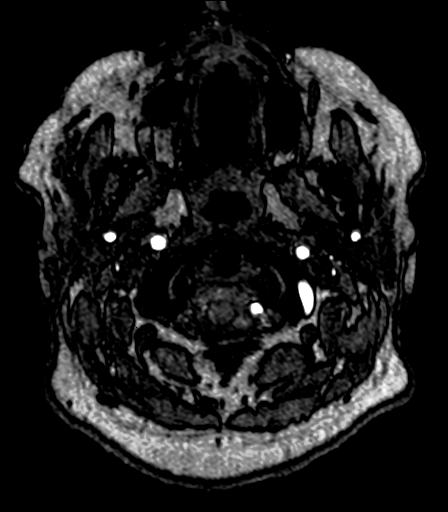
[im 24/143]
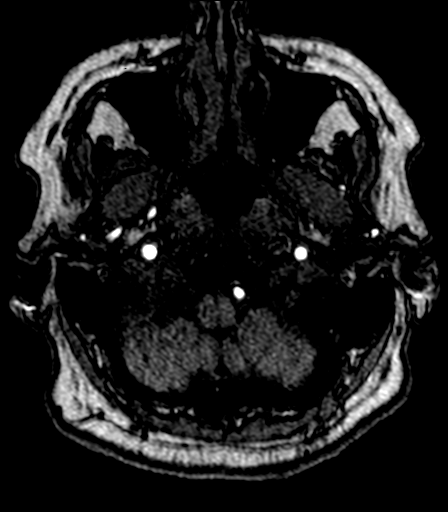
[im 48/143]
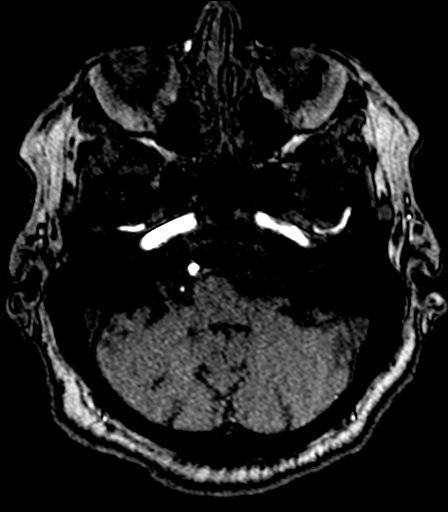
[im 60/143]
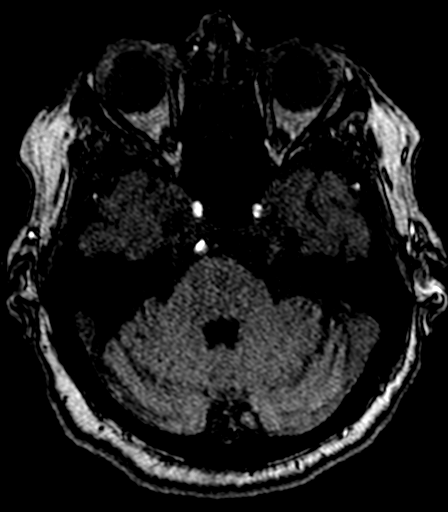
[im 83/143]
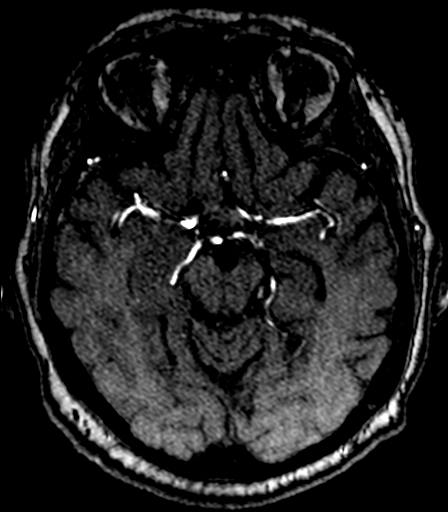
[im 95/143]
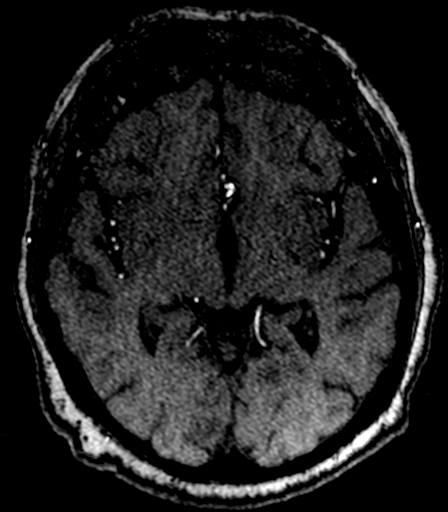
[im 119/143]
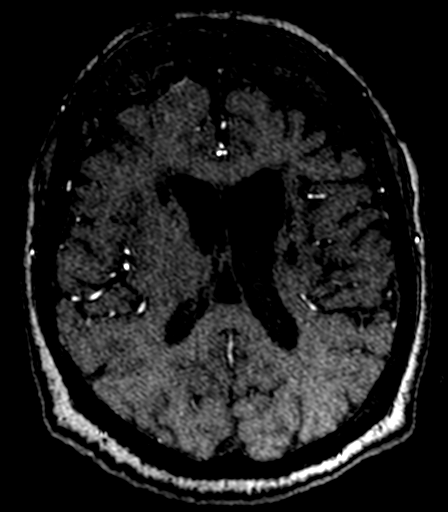
[im 143/143]
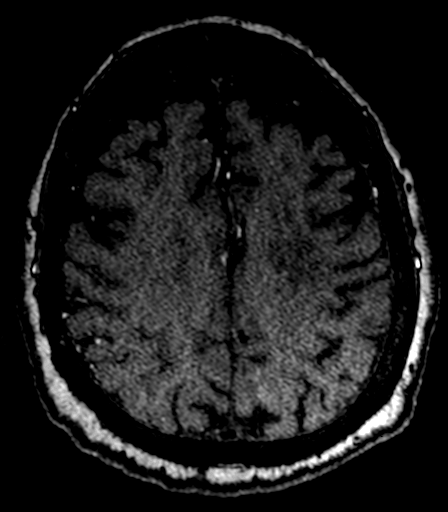

[Series 16: T2 · coronal · 5.0mm · 0.49mm/px · 2 of 27 slices shown (3 of 3)]
[im 1/27]
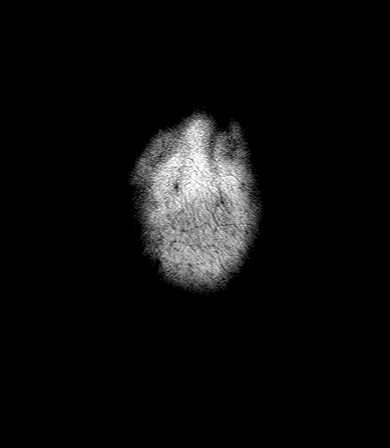
[im 27/27]
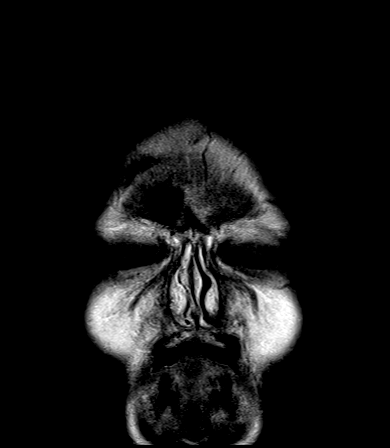

[Series 100: (id) · axial · 3.0mm · 1.80mm/px · z∈[-39,-1]mm · 2 of 54 slices shown]
[im 1/54]
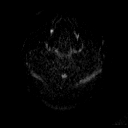
[im 14/54]
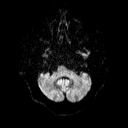

[36 of 48 positions shown; findings below may reference images not displayed]

FINDINGS: MRI HEAD FINDINGS

Acute nonhemorrhagic infarct is present within the posterior left
frontal lobe. This involves the left frontal operculum and the
precentral gyrus. Maximum dimension of the infarct is 4 cm. T2
changes are present within the area of restricted diffusion.

More remote lacunar infarcts are present within the basal ganglia
and centrum semi of Candelario bilaterally, left greater than right.
Asymmetric subcortical white matter disease is present in the left
hemisphere. Additional remote lacunar infarcts are present in the
basal ganglia and cerebellum bilaterally. White matter changes
extend into the brainstem.

Flow is present in the major intracranial arteries.

The skullbase is within normal limits. A relatively empty sella is
present. Midline sagittal structures are otherwise unremarkable. The
upper cervical spine is within normal limits.

The left cerebral peduncle is smaller than the right suggesting
remote ischemia and will layering degeneration.

The paranasal sinuses and mastoid air cells are clear. The globes
and orbits are intact.

Hyperostosis frontalis internus is noted.

MRA HEAD FINDINGS

The internal carotid arteries are within normal limits from the high
cervical segments through the ICA termini bilaterally. Minimal
atherosclerotic changes present within the cavernous segments.
Moderate stenoses are present in the left A1 segment. There is mild
irregularity in the right A1 segment. Mild distal left M1 segment
narrowing is present. Moderate segmental narrowing is present within
proximal M2 branches on the left without focal occlusion. More mild
segmental disease is present on the right. There is irregular
narrowing of the ACA branches bilaterally.

The left vertebral artery is the dominant vessel. The left PICA
origin is visualized and normal. The right AICA is dominant. The
basilar artery is within normal limits. Both posterior cerebral
arteries originate the basilar tip. Moderate narrowing is present
within proximal PCA branch vessels bilaterally.
IMPRESSION: 1. Acute nonhemorrhagic infarct within the posterior left frontal
lobe involving the left frontal operculum and precentral gyrus.
2. Additional remote lacunar infarcts are present within the centrum
semi of Candelario bilaterally, left greater than right.
3. Asymmetric left-sided white matter disease.
4. Moderate diffuse medium and small vessel disease as described
above. There is asymmetric attenuation of proximal M2 branches on
the left compared to the right.
5. Mild distal left M1 segment stenosis.
6. Moderate distal left A1 segment stenosis.
7. Moderate proximal P2 segment stenoses bilaterally.
These results were called by telephone at the time of interpretation
on 03/08/2016 at [DATE] to Dr. EL ALACRAN LIGHTFOOT , who verbally
acknowledged these results.

## 2017-11-08 IMAGING — CT CT HEAD CODE STROKE
3 series · 14 of 47 positions shown, 16 images · non-contrast
Comparison: CT HEAD December 22, 2010

CLINICAL DATA: Code stroke. Acute onset RIGHT-sided weakness and
speech difficulty. History of LEFT subdural hematoma.

EXAM:
CT HEAD WITHOUT CONTRAST
TECHNIQUE: Contiguous axial images were obtained from the base of the skull
through the vertex without intravenous contrast.

[Series 2: head wo · axial · 0.41mm/px · z∈[-72,+58]mm · 8 of 32 slices shown, 10 images]
[im 3/32  brain]
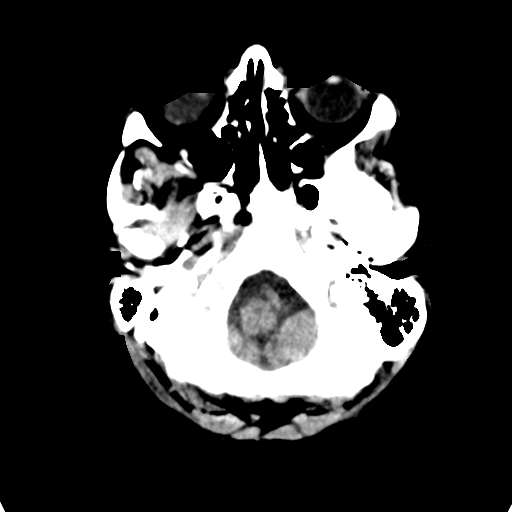
[im 3/32  bone]
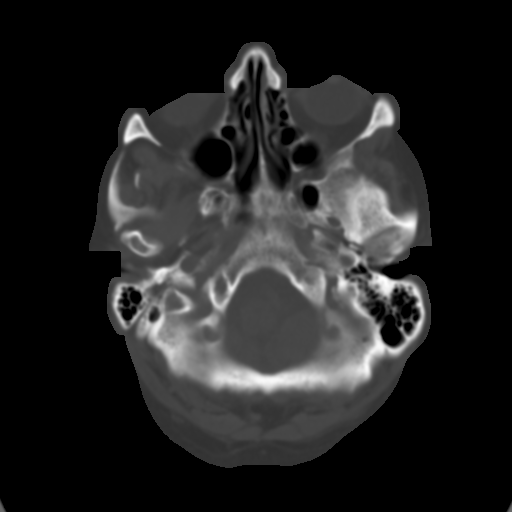
[im 7/32  brain]
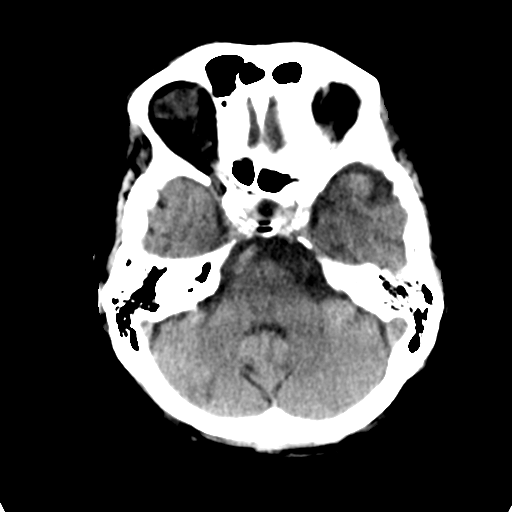
[im 10/32  brain]
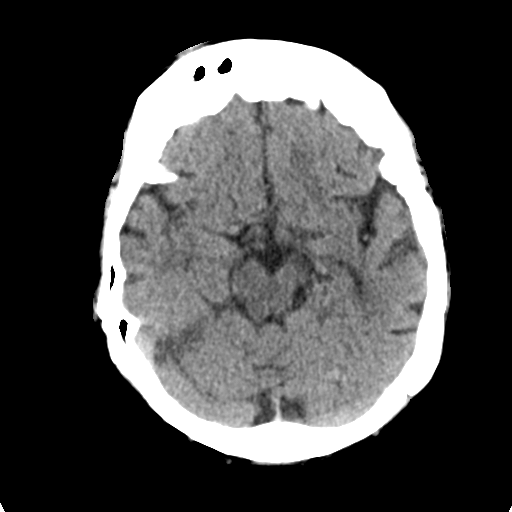
[im 14/32  brain]
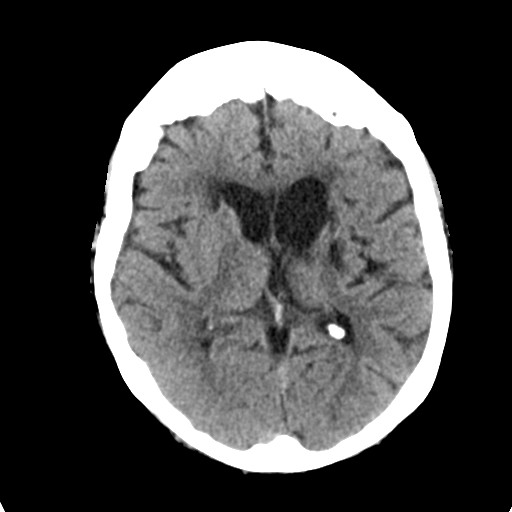
[im 18/32  brain]
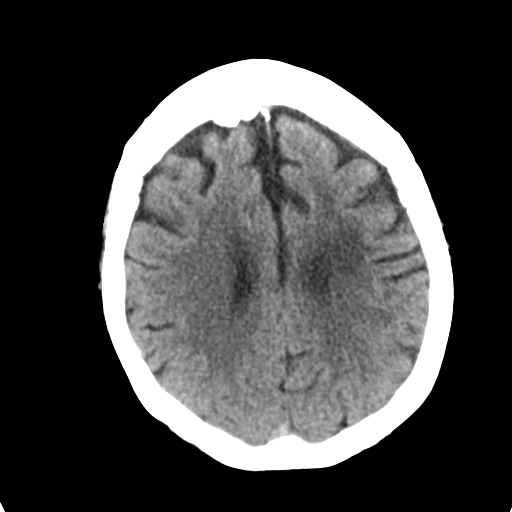
[im 18/32  bone]
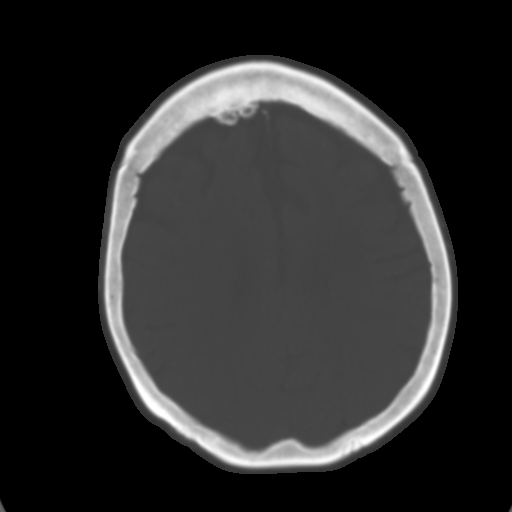
[im 22/32  brain]
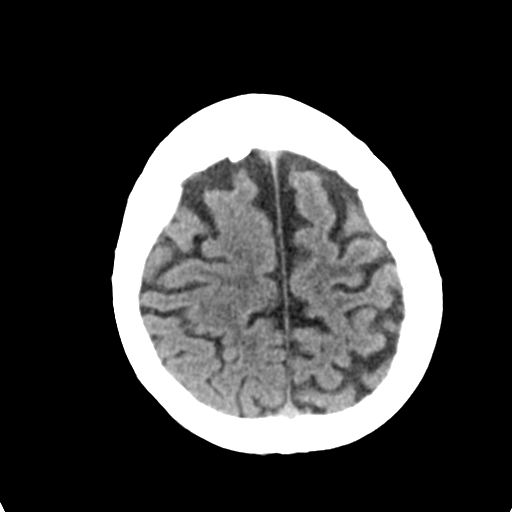
[im 25/32  brain]
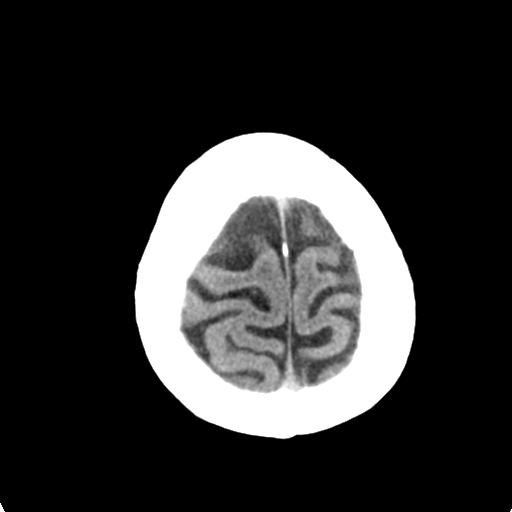
[im 29/32  brain]
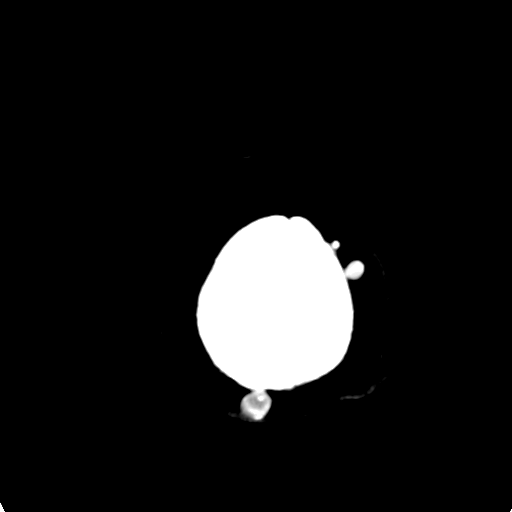

[Series 4: coronal soft tissue · coronal · 0.31mm/px · 3 of 59 slices shown]
[im 20/59  brain]
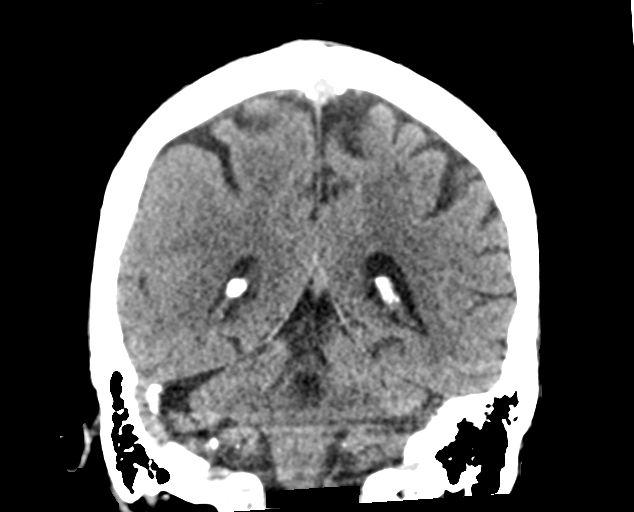
[im 26/59  brain]
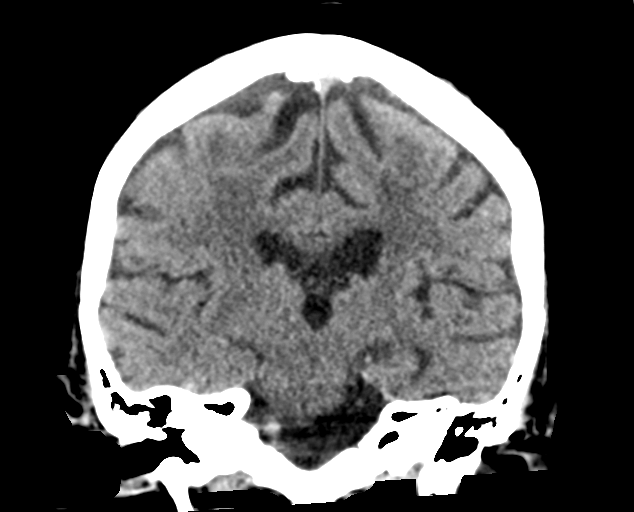
[im 33/59  brain]
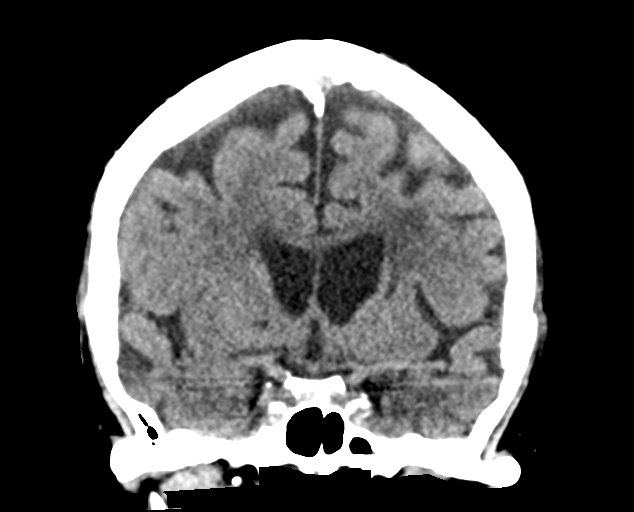

[Series 5: sagittal soft tissue · sagittal · 0.32mm/px · 3 of 52 slices shown]
[im 18/52  brain]
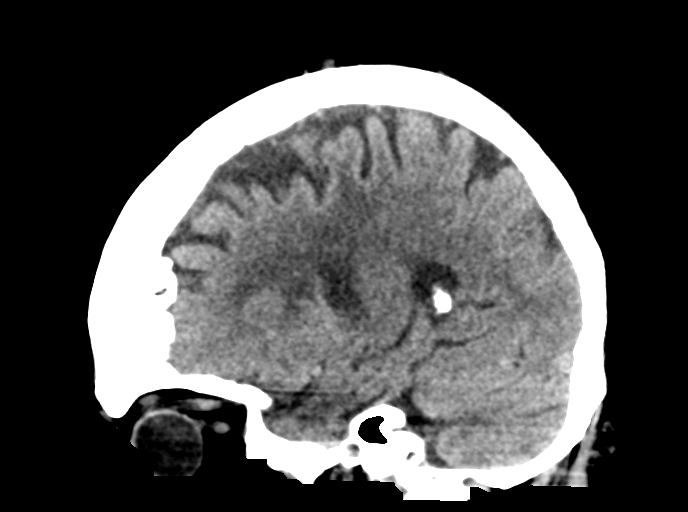
[im 26/52  brain]
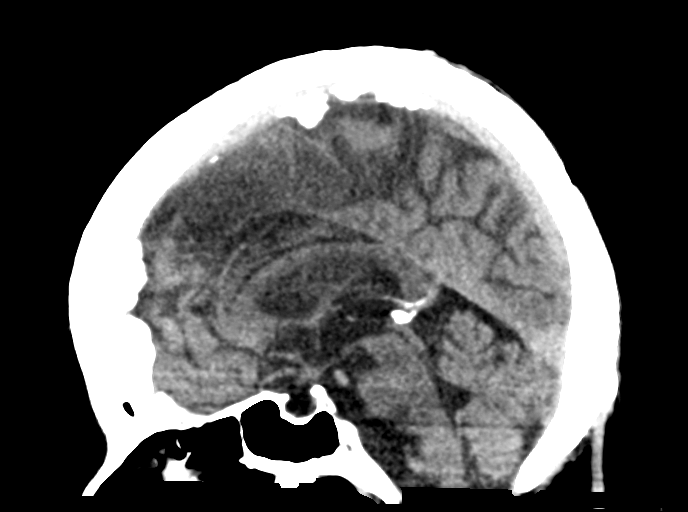
[im 35/52  brain]
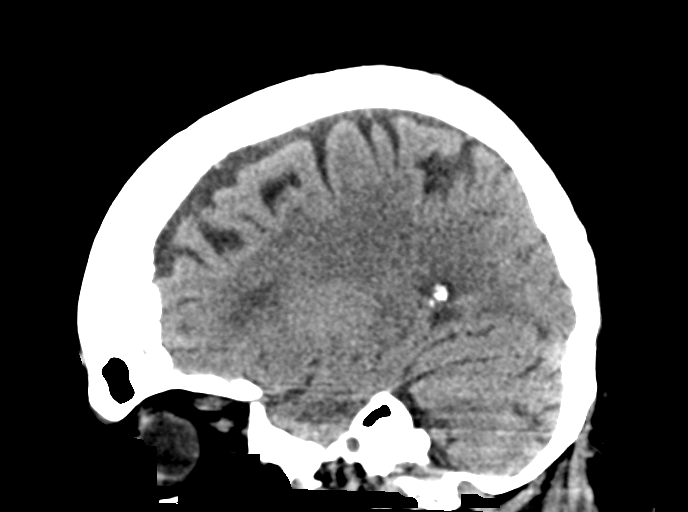

[14 of 47 positions shown; findings below may reference images not displayed]

FINDINGS: BRAIN: No intraparenchymal hemorrhage, mass effect, midline shift or
acute large vascular territory infarct. Old LEFT basal ganglia
infarct with ex vacuo dilatation LEFT lateral ventricle. Smaller
LEFT cerebral peduncle compatible with wallerian degeneration. Old
small RIGHT cerebellar infarcts. Stable LEFT posterior fossa
arachnoid cyst with mild mass effect. Resolution of LEFT subdural
hemorrhage.

VASCULAR: Moderate to severe calcific atherosclerosis of the carotid
siphons.

SKULL: No skull fracture. No significant scalp soft tissue swelling.
Hyperostosis frontalis internus. Multiple calcified scalp sebaceous
cysts.

SINUSES/ORBITS: The included ocular globes and orbital contents are
non-suspicious.The mastoid air-cells and included paranasal sinuses
are well-aerated.

OTHER: None.

ASPECTS (Alberta Stroke Program Early CT Score)

- Ganglionic level infarction (caudate, lentiform nuclei, internal
capsule, insula, M1-M3 cortex): 7

- Supraganglionic infarction (M4-M6 cortex): 3

Total score (0-10 with 10 being normal): 10
IMPRESSION: 1. No acute intracranial process. Old LEFT basal ganglia infarct and
moderate chronic small vessel ischemic disease.
2. ASPECTS is 10.

Acute findings discussed with and reconfirmed by Dr.UNG KING KRICKBOYZ
on 03/07/2016 at [DATE].

## 2018-11-23 IMAGING — DX DG CHEST 1V PORT
1 series · 1 of 1 positions shown · non-contrast
Comparison: January 26, 2017

CLINICAL DATA: Central chest pain.

EXAM:
PORTABLE CHEST 1 VIEW

[chest ap]
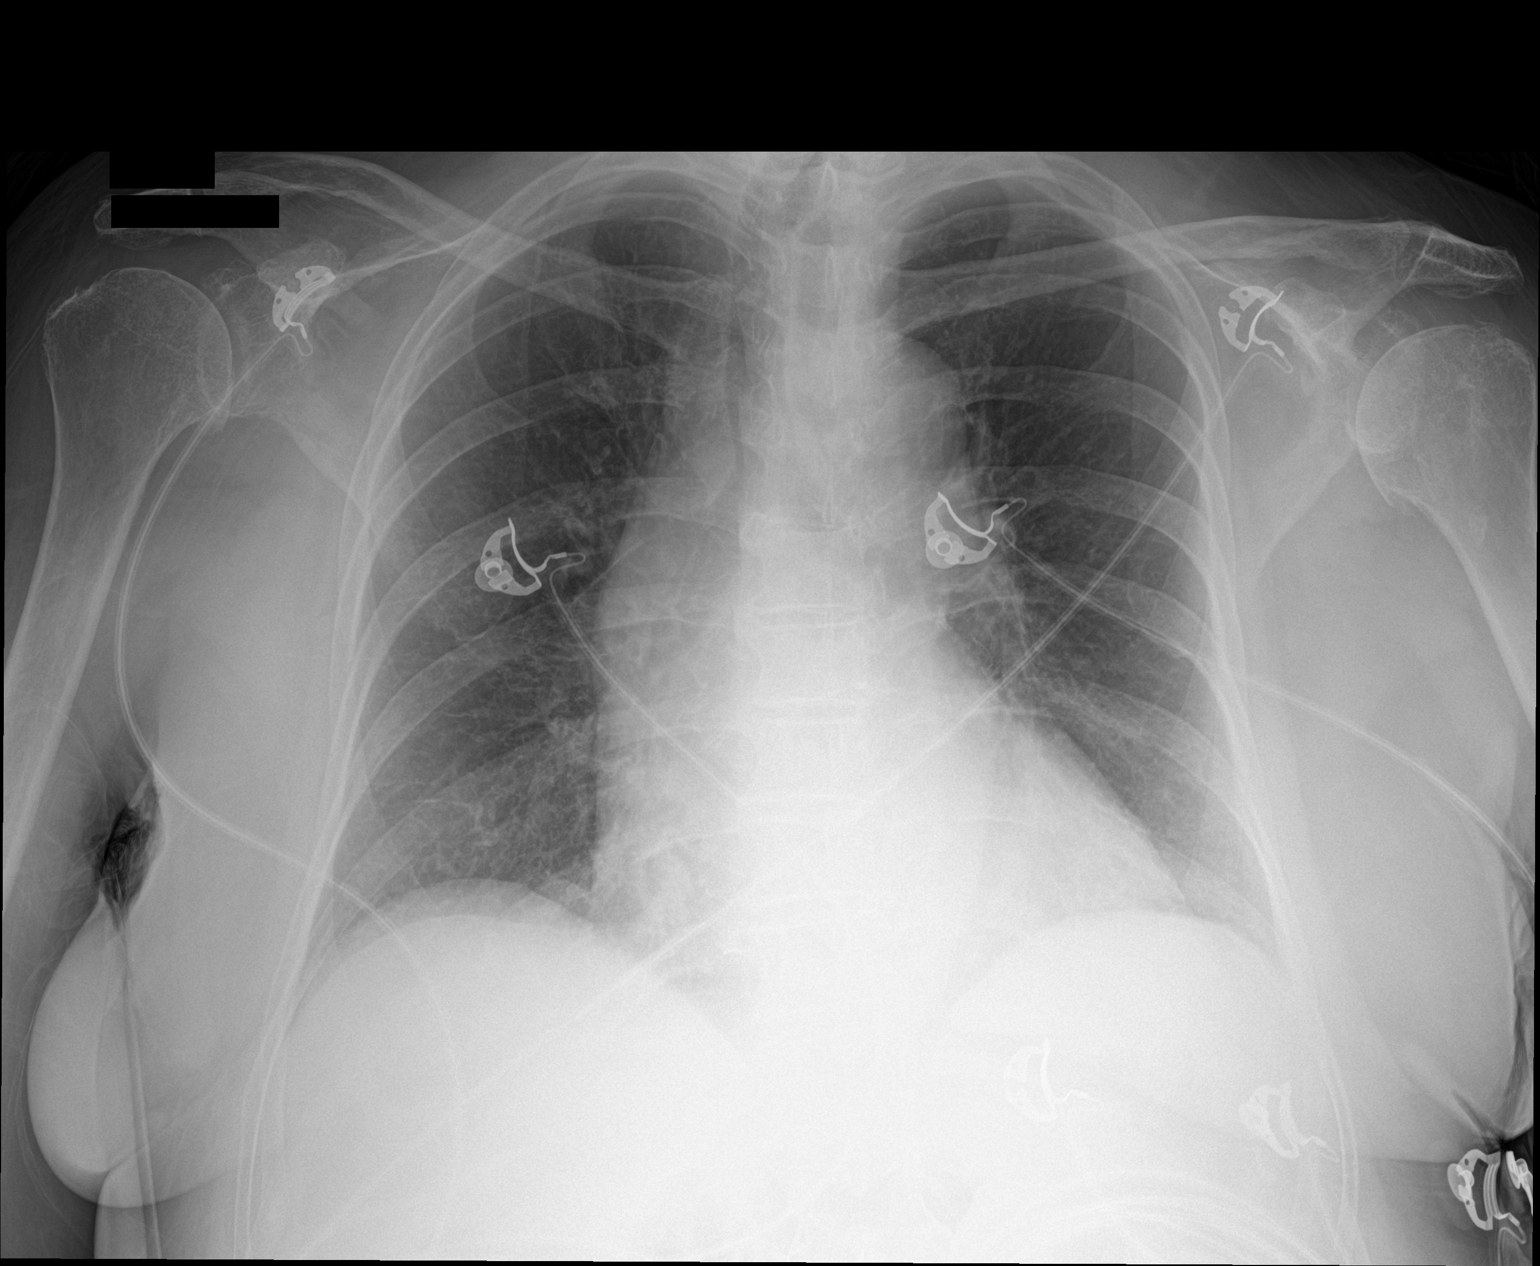

[1 of 1 positions shown; findings below may reference images not displayed]

FINDINGS: The cardiomediastinal silhouette is stable with a torturous thoracic
aorta. No pneumothorax. No pulmonary nodules, masses, or focal
infiltrates.
IMPRESSION: No active disease.

## 2020-09-06 ENCOUNTER — Other Ambulatory Visit: Payer: Self-pay

## 2020-09-06 ENCOUNTER — Emergency Department: Payer: Medicare Other

## 2020-09-06 DIAGNOSIS — Z83438 Family history of other disorder of lipoprotein metabolism and other lipidemia: Secondary | ICD-10-CM

## 2020-09-06 DIAGNOSIS — I160 Hypertensive urgency: Secondary | ICD-10-CM | POA: Diagnosis present

## 2020-09-06 DIAGNOSIS — I69320 Aphasia following cerebral infarction: Secondary | ICD-10-CM

## 2020-09-06 DIAGNOSIS — I69322 Dysarthria following cerebral infarction: Secondary | ICD-10-CM

## 2020-09-06 DIAGNOSIS — Z7401 Bed confinement status: Secondary | ICD-10-CM

## 2020-09-06 DIAGNOSIS — E785 Hyperlipidemia, unspecified: Secondary | ICD-10-CM | POA: Diagnosis present

## 2020-09-06 DIAGNOSIS — Z833 Family history of diabetes mellitus: Secondary | ICD-10-CM

## 2020-09-06 DIAGNOSIS — Z66 Do not resuscitate: Secondary | ICD-10-CM | POA: Diagnosis present

## 2020-09-06 DIAGNOSIS — Z823 Family history of stroke: Secondary | ICD-10-CM

## 2020-09-06 DIAGNOSIS — A4151 Sepsis due to Escherichia coli [E. coli]: Secondary | ICD-10-CM | POA: Diagnosis not present

## 2020-09-06 DIAGNOSIS — Z7982 Long term (current) use of aspirin: Secondary | ICD-10-CM

## 2020-09-06 DIAGNOSIS — I452 Bifascicular block: Secondary | ICD-10-CM | POA: Diagnosis present

## 2020-09-06 DIAGNOSIS — I5023 Acute on chronic systolic (congestive) heart failure: Secondary | ICD-10-CM | POA: Diagnosis present

## 2020-09-06 DIAGNOSIS — I2510 Atherosclerotic heart disease of native coronary artery without angina pectoris: Secondary | ICD-10-CM | POA: Diagnosis present

## 2020-09-06 DIAGNOSIS — I669 Occlusion and stenosis of unspecified cerebral artery: Secondary | ICD-10-CM | POA: Diagnosis present

## 2020-09-06 DIAGNOSIS — Z8249 Family history of ischemic heart disease and other diseases of the circulatory system: Secondary | ICD-10-CM

## 2020-09-06 DIAGNOSIS — K567 Ileus, unspecified: Secondary | ICD-10-CM | POA: Diagnosis present

## 2020-09-06 DIAGNOSIS — Z79899 Other long term (current) drug therapy: Secondary | ICD-10-CM

## 2020-09-06 DIAGNOSIS — D649 Anemia, unspecified: Secondary | ICD-10-CM | POA: Diagnosis present

## 2020-09-06 DIAGNOSIS — I252 Old myocardial infarction: Secondary | ICD-10-CM

## 2020-09-06 DIAGNOSIS — J189 Pneumonia, unspecified organism: Secondary | ICD-10-CM | POA: Diagnosis not present

## 2020-09-06 DIAGNOSIS — Z20822 Contact with and (suspected) exposure to covid-19: Secondary | ICD-10-CM | POA: Diagnosis present

## 2020-09-06 DIAGNOSIS — F32A Depression, unspecified: Secondary | ICD-10-CM | POA: Diagnosis present

## 2020-09-06 DIAGNOSIS — Z90710 Acquired absence of both cervix and uterus: Secondary | ICD-10-CM

## 2020-09-06 DIAGNOSIS — I110 Hypertensive heart disease with heart failure: Secondary | ICD-10-CM | POA: Diagnosis present

## 2020-09-06 DIAGNOSIS — K219 Gastro-esophageal reflux disease without esophagitis: Secondary | ICD-10-CM | POA: Diagnosis present

## 2020-09-06 DIAGNOSIS — I69351 Hemiplegia and hemiparesis following cerebral infarction affecting right dominant side: Secondary | ICD-10-CM

## 2020-09-06 DIAGNOSIS — Z7902 Long term (current) use of antithrombotics/antiplatelets: Secondary | ICD-10-CM

## 2020-09-06 DIAGNOSIS — N3000 Acute cystitis without hematuria: Secondary | ICD-10-CM | POA: Diagnosis present

## 2020-09-06 DIAGNOSIS — I340 Nonrheumatic mitral (valve) insufficiency: Secondary | ICD-10-CM | POA: Diagnosis present

## 2020-09-06 LAB — COMPREHENSIVE METABOLIC PANEL
ALT: 9 U/L (ref 0–44)
AST: 17 U/L (ref 15–41)
Albumin: 3.4 g/dL — ABNORMAL LOW (ref 3.5–5.0)
Alkaline Phosphatase: 52 U/L (ref 38–126)
Anion gap: 7 (ref 5–15)
BUN: 15 mg/dL (ref 8–23)
CO2: 23 mmol/L (ref 22–32)
Calcium: 8.4 mg/dL — ABNORMAL LOW (ref 8.9–10.3)
Chloride: 107 mmol/L (ref 98–111)
Creatinine, Ser: 0.7 mg/dL (ref 0.44–1.00)
GFR, Estimated: >60 mL/min (ref >60)
Glucose, Bld: 116 mg/dL — ABNORMAL HIGH (ref 70–99)
Potassium: 4 mmol/L (ref 3.5–5.1)
Sodium: 137 mmol/L (ref 135–145)
Total Bilirubin: 0.8 mg/dL (ref 0.3–1.2)
Total Protein: 6.5 g/dL (ref 6.5–8.1)

## 2020-09-06 LAB — CBC
HCT: 32.2 % — ABNORMAL LOW (ref 36.0–46.0)
Hemoglobin: 10.6 g/dL — ABNORMAL LOW (ref 12.0–15.0)
MCH: 31.9 pg (ref 26.0–34.0)
MCHC: 32.9 g/dL (ref 30.0–36.0)
MCV: 97 fL (ref 80.0–100.0)
Platelets: 140 10*3/uL — ABNORMAL LOW (ref 150–400)
RBC: 3.32 MIL/uL — ABNORMAL LOW (ref 3.87–5.11)
RDW: 12.8 % (ref 11.5–15.5)
WBC: 7.6 10*3/uL (ref 4.0–10.5)
nRBC: 0 % (ref 0.0–0.2)

## 2020-09-06 LAB — LIPASE, BLOOD: Lipase: 25 U/L (ref 11–51)

## 2020-09-06 LAB — TROPONIN I (HIGH SENSITIVITY): Troponin I (High Sensitivity): 12 ng/L (ref <18)

## 2020-09-06 NOTE — ED Triage Notes (Signed)
EMS brings pt in from [facility] for c/o [facility]; O2 sat 90 on ra; placed on 2l/min via [place] to bring to 97%; hx rt sided CVA

## 2020-09-06 NOTE — ED Triage Notes (Signed)
Pt brought in by [facility] from [facility] with co was called out for shob, sats RA she was 90-92% was placed on o2 at 2l per [place]. Pt now co abd pain, no BM x 2 days, VS wnl per EMS. Pt still co shob at this time, and co mid abd pain. Pt has had a stroke in the past and currently has slurred speech.

## 2020-09-07 ENCOUNTER — Inpatient Hospital Stay
Admission: EM | Admit: 2020-09-07 | Discharge: 2020-09-10 | DRG: 871 | Disposition: A | Discharge status: Skilled Nursing Facility | Payer: Medicare Other | Source: Ambulatory Visit | Attending: Internal Medicine | Admitting: Internal Medicine

## 2020-09-07 ENCOUNTER — Inpatient Hospital Stay: Payer: Medicare Other

## 2020-09-07 ENCOUNTER — Emergency Department: Payer: Medicare Other

## 2020-09-07 ENCOUNTER — Encounter: Payer: Self-pay | Admitting: Radiology

## 2020-09-07 DIAGNOSIS — Z8249 Family history of ischemic heart disease and other diseases of the circulatory system: Secondary | ICD-10-CM | POA: Diagnosis not present

## 2020-09-07 DIAGNOSIS — N390 Urinary tract infection, site not specified: Secondary | ICD-10-CM

## 2020-09-07 DIAGNOSIS — I69351 Hemiplegia and hemiparesis following cerebral infarction affecting right dominant side: Secondary | ICD-10-CM | POA: Diagnosis not present

## 2020-09-07 DIAGNOSIS — I10 Essential (primary) hypertension: Secondary | ICD-10-CM | POA: Diagnosis not present

## 2020-09-07 DIAGNOSIS — Z66 Do not resuscitate: Secondary | ICD-10-CM | POA: Diagnosis present

## 2020-09-07 DIAGNOSIS — I160 Hypertensive urgency: Secondary | ICD-10-CM | POA: Diagnosis present

## 2020-09-07 DIAGNOSIS — I2510 Atherosclerotic heart disease of native coronary artery without angina pectoris: Secondary | ICD-10-CM | POA: Diagnosis present

## 2020-09-07 DIAGNOSIS — R479 Unspecified speech disturbances: Secondary | ICD-10-CM

## 2020-09-07 DIAGNOSIS — J189 Pneumonia, unspecified organism: Secondary | ICD-10-CM | POA: Diagnosis present

## 2020-09-07 DIAGNOSIS — J188 Other pneumonia, unspecified organism: Secondary | ICD-10-CM | POA: Diagnosis present

## 2020-09-07 DIAGNOSIS — E785 Hyperlipidemia, unspecified: Secondary | ICD-10-CM | POA: Diagnosis present

## 2020-09-07 DIAGNOSIS — K219 Gastro-esophageal reflux disease without esophagitis: Secondary | ICD-10-CM | POA: Diagnosis present

## 2020-09-07 DIAGNOSIS — I5023 Acute on chronic systolic (congestive) heart failure: Secondary | ICD-10-CM

## 2020-09-07 DIAGNOSIS — A419 Sepsis, unspecified organism: Secondary | ICD-10-CM | POA: Diagnosis not present

## 2020-09-07 DIAGNOSIS — F32A Depression, unspecified: Secondary | ICD-10-CM | POA: Diagnosis present

## 2020-09-07 DIAGNOSIS — D649 Anemia, unspecified: Secondary | ICD-10-CM | POA: Diagnosis present

## 2020-09-07 DIAGNOSIS — I110 Hypertensive heart disease with heart failure: Secondary | ICD-10-CM | POA: Diagnosis present

## 2020-09-07 DIAGNOSIS — I69320 Aphasia following cerebral infarction: Secondary | ICD-10-CM | POA: Diagnosis not present

## 2020-09-07 DIAGNOSIS — N3000 Acute cystitis without hematuria: Secondary | ICD-10-CM

## 2020-09-07 DIAGNOSIS — Z823 Family history of stroke: Secondary | ICD-10-CM | POA: Diagnosis not present

## 2020-09-07 DIAGNOSIS — K567 Ileus, unspecified: Secondary | ICD-10-CM

## 2020-09-07 DIAGNOSIS — I69322 Dysarthria following cerebral infarction: Secondary | ICD-10-CM | POA: Diagnosis not present

## 2020-09-07 DIAGNOSIS — Z7401 Bed confinement status: Secondary | ICD-10-CM | POA: Diagnosis not present

## 2020-09-07 DIAGNOSIS — I252 Old myocardial infarction: Secondary | ICD-10-CM | POA: Diagnosis not present

## 2020-09-07 DIAGNOSIS — I669 Occlusion and stenosis of unspecified cerebral artery: Secondary | ICD-10-CM | POA: Diagnosis present

## 2020-09-07 DIAGNOSIS — Z83438 Family history of other disorder of lipoprotein metabolism and other lipidemia: Secondary | ICD-10-CM | POA: Diagnosis not present

## 2020-09-07 DIAGNOSIS — I452 Bifascicular block: Secondary | ICD-10-CM | POA: Diagnosis present

## 2020-09-07 DIAGNOSIS — A4151 Sepsis due to Escherichia coli [E. coli]: Secondary | ICD-10-CM | POA: Diagnosis present

## 2020-09-07 DIAGNOSIS — Z20822 Contact with and (suspected) exposure to covid-19: Secondary | ICD-10-CM | POA: Diagnosis present

## 2020-09-07 DIAGNOSIS — I340 Nonrheumatic mitral (valve) insufficiency: Secondary | ICD-10-CM | POA: Diagnosis present

## 2020-09-07 LAB — URINALYSIS, COMPLETE (UACMP) WITH MICROSCOPIC
Bilirubin Urine: NEGATIVE
Glucose, UA: NEGATIVE mg/dL
Ketones, ur: NEGATIVE mg/dL
Nitrite: POSITIVE — AB
Protein, ur: 30 mg/dL — AB
Specific Gravity, Urine: 1.02 (ref 1.005–1.030)
WBC, UA: >50 WBC/hpf — ABNORMAL HIGH (ref 0–5)
pH: 7 (ref 5.0–8.0)

## 2020-09-07 LAB — CBC
HCT: 33.1 % — ABNORMAL LOW (ref 36.0–46.0)
Hemoglobin: 10.8 g/dL — ABNORMAL LOW (ref 12.0–15.0)
MCH: 31.7 pg (ref 26.0–34.0)
MCHC: 32.6 g/dL (ref 30.0–36.0)
MCV: 97.1 fL (ref 80.0–100.0)
Platelets: 140 10*3/uL — ABNORMAL LOW (ref 150–400)
RBC: 3.41 MIL/uL — ABNORMAL LOW (ref 3.87–5.11)
RDW: 12.8 % (ref 11.5–15.5)
WBC: 8.5 10*3/uL (ref 4.0–10.5)
nRBC: 0 % (ref 0.0–0.2)

## 2020-09-07 LAB — MRSA PCR SCREENING: MRSA by PCR: NEGATIVE

## 2020-09-07 LAB — RESP PANEL BY RT-PCR (FLU A&B, COVID) ARPGX2
Influenza A by PCR: NEGATIVE
Influenza B by PCR: NEGATIVE
SARS Coronavirus 2 by RT PCR: NEGATIVE

## 2020-09-07 LAB — TSH: TSH: 1.899 u[IU]/mL (ref 0.350–4.500)

## 2020-09-07 LAB — BASIC METABOLIC PANEL
Anion gap: 7 (ref 5–15)
BUN: 13 mg/dL (ref 8–23)
CO2: 24 mmol/L (ref 22–32)
Calcium: 8.6 mg/dL — ABNORMAL LOW (ref 8.9–10.3)
Chloride: 107 mmol/L (ref 98–111)
Creatinine, Ser: 0.62 mg/dL (ref 0.44–1.00)
GFR, Estimated: >60 mL/min (ref >60)
Glucose, Bld: 109 mg/dL — ABNORMAL HIGH (ref 70–99)
Potassium: 3.5 mmol/L (ref 3.5–5.1)
Sodium: 138 mmol/L (ref 135–145)

## 2020-09-07 LAB — LACTIC ACID, PLASMA: Lactic Acid, Venous: 1.4 mmol/L (ref 0.5–1.9)

## 2020-09-07 LAB — BRAIN NATRIURETIC PEPTIDE: B Natriuretic Peptide: 524.4 pg/mL — ABNORMAL HIGH (ref 0.0–100.0)

## 2020-09-07 LAB — TROPONIN I (HIGH SENSITIVITY): Troponin I (High Sensitivity): 16 ng/L (ref <18)

## 2020-09-07 LAB — PROCALCITONIN: Procalcitonin: <0.1 ng/mL

## 2020-09-07 LAB — STREP PNEUMONIAE URINARY ANTIGEN: Strep Pneumo Urinary Antigen: NEGATIVE

## 2020-09-07 MED ORDER — ADULT MULTIVITAMIN W/MINERALS CH
1.0000 | ORAL_TABLET | Freq: Every day | ORAL | Status: DC
  Administered 2020-09-07 – 2020-09-10 (×4): 1 via ORAL
  Filled 2020-09-07 (×4): qty 1

## 2020-09-07 MED ORDER — ONDANSETRON HCL 4 MG PO TABS: 4.0000 mg | ORAL_TABLET | Freq: Four times a day (QID) | ORAL | Status: DC | PRN

## 2020-09-07 MED ORDER — TRAZODONE HCL 50 MG PO TABS: 25.0000 mg | ORAL_TABLET | Freq: Every evening | ORAL | Status: DC | PRN

## 2020-09-07 MED ORDER — NITROGLYCERIN 0.4 MG/SPRAY TL SOLN
1.0000 | Status: DC | PRN
  Filled 2020-09-07: qty 4.9

## 2020-09-07 MED ORDER — SODIUM CHLORIDE 0.9 % IV SOLN
2.0000 g | INTRAVENOUS | Status: DC
  Administered 2020-09-07 – 2020-09-09 (×3): 2 g via INTRAVENOUS
  Filled 2020-09-07: qty 2
  Filled 2020-09-07: qty 20
  Filled 2020-09-07: qty 2

## 2020-09-07 MED ORDER — FUROSEMIDE 20 MG PO TABS: 20.0000 mg | ORAL_TABLET | Freq: Once | ORAL | Status: DC

## 2020-09-07 MED ORDER — IOHEXOL 350 MG/ML SOLN
100.0000 mL | Freq: Once | INTRAVENOUS | Status: AC | PRN
  Administered 2020-09-07: 100 mL via INTRAVENOUS

## 2020-09-07 MED ORDER — ACETAMINOPHEN 650 MG RE SUPP: 650.0000 mg | Freq: Four times a day (QID) | RECTAL | Status: DC | PRN

## 2020-09-07 MED ORDER — METOCLOPRAMIDE HCL 10 MG PO TABS: 5.0000 mg | ORAL_TABLET | Freq: Every evening | ORAL | Status: DC

## 2020-09-07 MED ORDER — ATORVASTATIN CALCIUM 20 MG PO TABS
40.0000 mg | ORAL_TABLET | Freq: Every day | ORAL | Status: DC
  Administered 2020-09-07 – 2020-09-09 (×3): 40 mg via ORAL
  Filled 2020-09-07 (×3): qty 2

## 2020-09-07 MED ORDER — MAGNESIUM HYDROXIDE 400 MG/5ML PO SUSP: 30.0000 mL | Freq: Every day | ORAL | Status: DC | PRN

## 2020-09-07 MED ORDER — SODIUM CHLORIDE 0.9 % IV SOLN: 500.0000 mg | Freq: Once | INTRAVENOUS | Status: DC

## 2020-09-07 MED ORDER — GUAIFENESIN ER 600 MG PO TB12
600.0000 mg | ORAL_TABLET | Freq: Two times a day (BID) | ORAL | Status: DC
  Administered 2020-09-07 – 2020-09-08 (×3): 600 mg via ORAL
  Filled 2020-09-07 (×3): qty 1

## 2020-09-07 MED ORDER — IPRATROPIUM-ALBUTEROL 0.5-2.5 (3) MG/3ML IN SOLN
3.0000 mL | Freq: Four times a day (QID) | RESPIRATORY_TRACT | Status: DC
  Administered 2020-09-07 – 2020-09-08 (×5): 3 mL via RESPIRATORY_TRACT
  Filled 2020-09-07 (×5): qty 3

## 2020-09-07 MED ORDER — CLOPIDOGREL BISULFATE 75 MG PO TABS
75.0000 mg | ORAL_TABLET | Freq: Every day | ORAL | Status: DC
Start: 2020-09-07 — End: 2020-09-10
  Administered 2020-09-07 – 2020-09-10 (×4): 75 mg via ORAL
  Filled 2020-09-07 (×4): qty 1

## 2020-09-07 MED ORDER — PANTOPRAZOLE SODIUM 40 MG PO TBEC
40.0000 mg | DELAYED_RELEASE_TABLET | Freq: Every day | ORAL | Status: DC
  Administered 2020-09-07 – 2020-09-10 (×4): 40 mg via ORAL
  Filled 2020-09-07 (×4): qty 1

## 2020-09-07 MED ORDER — ACETAMINOPHEN 325 MG PO TABS: 650.0000 mg | ORAL_TABLET | Freq: Four times a day (QID) | ORAL | Status: DC | PRN

## 2020-09-07 MED ORDER — METOPROLOL TARTRATE 50 MG PO TABS
50.0000 mg | ORAL_TABLET | Freq: Two times a day (BID) | ORAL | Status: DC
  Administered 2020-09-07 – 2020-09-10 (×7): 50 mg via ORAL
  Filled 2020-09-07 (×7): qty 1

## 2020-09-07 MED ORDER — SODIUM CHLORIDE 0.9 % IV SOLN
1.0000 g | Freq: Once | INTRAVENOUS | Status: DC
  Filled 2020-09-07: qty 10

## 2020-09-07 MED ORDER — ACETAMINOPHEN 650 MG RE SUPP
650.0000 mg | Freq: Once | RECTAL | Status: AC
  Administered 2020-09-07: 650 mg via RECTAL
  Filled 2020-09-07: qty 1

## 2020-09-07 MED ORDER — ISOSORBIDE MONONITRATE ER 30 MG PO TB24
30.0000 mg | ORAL_TABLET | Freq: Every day | ORAL | Status: DC
  Administered 2020-09-07 – 2020-09-10 (×4): 30 mg via ORAL
  Filled 2020-09-07 (×4): qty 1

## 2020-09-07 MED ORDER — IPRATROPIUM-ALBUTEROL 0.5-2.5 (3) MG/3ML IN SOLN
3.0000 mL | Freq: Once | RESPIRATORY_TRACT | Status: AC
  Administered 2020-09-07: 3 mL via RESPIRATORY_TRACT
  Filled 2020-09-07: qty 3

## 2020-09-07 MED ORDER — ONDANSETRON HCL 4 MG/2ML IJ SOLN: 4.0000 mg | Freq: Four times a day (QID) | INTRAMUSCULAR | Status: DC | PRN

## 2020-09-07 MED ORDER — ENOXAPARIN SODIUM 40 MG/0.4ML ~~LOC~~ SOLN
40.0000 mg | SUBCUTANEOUS | Status: DC
  Administered 2020-09-07 – 2020-09-10 (×4): 40 mg via SUBCUTANEOUS
  Filled 2020-09-07 (×4): qty 0.4

## 2020-09-07 MED ORDER — DM-GUAIFENESIN ER 30-600 MG PO TB12
1.0000 | ORAL_TABLET | Freq: Two times a day (BID) | ORAL | Status: DC
  Administered 2020-09-07 – 2020-09-08 (×3): 1 via ORAL
  Filled 2020-09-07 (×3): qty 1

## 2020-09-07 MED ORDER — FUROSEMIDE 10 MG/ML IJ SOLN
40.0000 mg | Freq: Every day | INTRAMUSCULAR | Status: DC
  Administered 2020-09-07: 40 mg via INTRAVENOUS
  Filled 2020-09-07: qty 4

## 2020-09-07 MED ORDER — HYDROCHLOROTHIAZIDE 25 MG PO TABS: 25.0000 mg | ORAL_TABLET | Freq: Every day | ORAL | Status: DC

## 2020-09-07 MED ORDER — FUROSEMIDE 20 MG PO TABS
20.0000 mg | ORAL_TABLET | Freq: Every day | ORAL | Status: DC
  Administered 2020-09-07 – 2020-09-09 (×3): 20 mg via ORAL
  Filled 2020-09-07 (×3): qty 1

## 2020-09-07 MED ORDER — ASPIRIN 81 MG PO CHEW
81.0000 mg | CHEWABLE_TABLET | Freq: Every day | ORAL | Status: DC
  Administered 2020-09-07 – 2020-09-10 (×4): 81 mg via ORAL
  Filled 2020-09-07 (×4): qty 1

## 2020-09-07 MED ORDER — LISINOPRIL 5 MG PO TABS
5.0000 mg | ORAL_TABLET | Freq: Every day | ORAL | Status: DC
  Administered 2020-09-07 – 2020-09-10 (×4): 5 mg via ORAL
  Filled 2020-09-07 (×4): qty 1

## 2020-09-07 MED ORDER — SODIUM CHLORIDE 0.9 % IV SOLN
500.0000 mg | INTRAVENOUS | Status: DC
  Administered 2020-09-07: 500 mg via INTRAVENOUS
  Filled 2020-09-07 (×2): qty 500

## 2020-09-07 NOTE — Progress Notes (Signed)
Spoke with Kenney Houseman from [employer], awaiting fax of [facility] for pt at this time.

## 2020-09-07 NOTE — Progress Notes (Signed)
Attempted to update Alvan Dame, pt's daughter,  call went straight to voicemail.

## 2020-09-07 NOTE — ED Notes (Signed)
Patient remains in CT at this time 

## 2020-09-07 NOTE — ED Notes (Signed)
Patient transported to CT 

## 2020-09-07 NOTE — ED Notes (Signed)
Patient presents to the ED from a SNF [facility]), whom reported to EMS that the patient has been complaining of abdominal pain. EMS also reported the patient was short of breath on their arrival to the facility. The patient is noted to have a disorganized work of breathing on arrival to the room, but saturation is noted to be 97%, and the patient does not appear to be in distress. Patient is noted to have wheezing bilaterally on assessment. Patient is reported to be baseline mental status at this time. Patient is alert, but oriented only to person and place. Patient is forgetful as well.

## 2020-09-07 NOTE — H&P (Addendum)
[place]   PATIENT NAME: Alexandria Hanson    MR#:  [id number]  DATE OF BIRTH:  1928/10/07  DATE OF ADMISSION:  09/07/2020  PRIMARY CARE PHYSICIAN: Derwood KaplanEason, Ernest B, MD   Patient is coming from: [facility]  REQUESTING/REFERRING PHYSICIAN: Ward, Layla MawKristen N, DO  CHIEF COMPLAINT:   Chief Complaint  Patient presents with  . Abdominal Pain    HISTORY OF PRESENT ILLNESS:  Alexandria Poaglsie Douse is a 87 y.o. Caucasian female with medical history significant for coronary artery disease, hypertension, CVA in the anxiety, who presented to the ER with acute onset of worsening dyspnea and gasping for air with conversational dyspnea.  Pulse oximetry was 90% on room air.  She is bedbound from previous stroke with residual expressive aphasia.  History was difficult to obtain due to the patient's aphasia.  She had fever here of 101 rectally with mild tachypnea and normal pulse oximetry.  She admitted to dysuria without urinary frequency or urgency.  She has been having 2 pillow orthopnea and paroxysmal nocturnal dyspnea.  She has no worsening lower extremity edema.  No reported nausea or vomiting .  She admits to lower abdominal pain. No reported chest pain or palpitations.  ED Course: When she came to the ER blood pressure was 177/63 with otherwise normal vital signs.  Blood pressure was 101.  Labs were remarkable for a BNP of 524.4 with high-sensitivity troponin I of 12 lactic acid 1.4.  CBC showed mild anemia slightly lower than previous levels.  Influenza antigens and COVID-19 PCR came back negative UA was positive for UTI.  Blood and urine cultures were drawn.  EKG as reviewed by me : showed normal sinus rhythm with rate of 77, right bundle branch block and left posterior fascicular block with no significant changes from last EKG. Imaging: Chest x-ray showed no acute cardiopulmonary disease and aortic atherosclerosis.  Abdomen x-ray showed increased air throughout nondilated small bowel in the  central abdomen that may represent ileus or enteritis with no evidence of obstruction.  It showed air-filled colon with only minimal formed stool.  Abdominal pelvic CT and chest CTA revealed the following no evidence for PE, pulmonary artery tension-unchanged extensive multivessel coronary artery calcification, multifocal pulmonary infiltrates in keeping with changes of atypical infection with no central obstruction, moderate circumferential bladder wall thickening suspicious for infectious or inflammatory cystitis and peripheral vascular disease with extensive atherosclerotic calcification in the renal ostia bilaterally as well as aortic atherosclerosis.  The patient was given IV Rocephin and Zithromax was [place]  P.o. Tylenol.  She will be admitted to a progressive unit bed for further evaluation and management. PAST MEDICAL HISTORY:   Past Medical History:  Diagnosis Date  . Anxiety   . Bleeding in brain [facility](HCC)    s/p fall  . CAD (coronary artery disease)    s/p MI 30 yrs ago  . Cerebral artery occlusion   . Depression   . Hypertension   . MI (mitral incompetence)   . Stroke (HCC) 2001  . Stroke [facility](HCC)     PAST SURGICAL HISTORY:   Past Surgical History:  Procedure Laterality Date  . ABDOMINAL HYSTERECTOMY    . APPENDECTOMY    . CHOLECYSTECTOMY    . COLON SURGERY     knot on colon removed    SOCIAL HISTORY:   Social History   Tobacco Use  . Smoking status: Never Smoker  . Smokeless tobacco: Never Used  Substance Use Topics  . Alcohol  use: No    FAMILY HISTORY:   Family History  Problem Relation Age of Onset  . CVA Mother   . CAD Father   . CAD Sister   . Hyperlipidemia Mother   . Hypertension Mother   . Diabetes Sister   . Hypertension Sister     DRUG ALLERGIES:  No Known Allergies  REVIEW OF SYSTEMS:   ROS As per history of present illness. All pertinent systems were reviewed above. Constitutional, HEENT, cardiovascular, respiratory, GI, GU,  musculoskeletal, neuro, psychiatric, endocrine, integumentary and hematologic systems were reviewed and are otherwise negative/unremarkable except for positive findings mentioned above in the HPI.   MEDICATIONS AT HOME:   Prior to Admission medications   Medication Sig Start Date End Date Taking? Authorizing Provider  acetaminophen (TYLENOL) 325 MG tablet Take 650 mg by mouth every 4 (four) hours as needed for mild pain or moderate pain.    [provider]  Alum & Mag Hydroxide-Simeth (GI COCKTAIL) SUSP suspension Take 30 mLs by mouth 3 (three) times daily as needed for indigestion. Shake well. 01/28/17   Shaune Pollack, MD  aspirin 81 MG chewable tablet Chew 81 mg by mouth daily.    [provider]  atorvastatin (LIPITOR) 40 MG tablet Take 1 tablet (40 mg total) by mouth daily at 6 PM. 03/11/16   Auburn Bilberry, MD  clopidogrel (PLAVIX) 75 MG tablet Take 1 tablet (75 mg total) by mouth daily. 03/11/16   Auburn Bilberry, MD  FLUoxetine (PROZAC) 10 MG capsule Take 10 mg by mouth daily.    [provider]  guaifenesin (ROBITUSSIN) 100 MG/5ML syrup Take 100 mg by mouth every 4 (four) hours as needed for cough.    [provider]  hydrochlorothiazide (HYDRODIURIL) 25 MG tablet Take 1 tablet (25 mg total) by mouth daily. 01/18/16   Gabriel Cirri, NP  isosorbide mononitrate (IMDUR) 30 MG 24 hr tablet Take 30 mg by mouth daily.    [provider]  lisinopril (PRINIVIL,ZESTRIL) 5 MG tablet Take 5 mg by mouth daily.    [provider]  metoCLOPramide (REGLAN) 5 MG tablet Take 5 mg by mouth every evening.    [provider]  metoprolol tartrate (LOPRESSOR) 25 MG tablet Take 1 tablet (25 mg total) by mouth 2 (two) times daily. 01/28/17   Shaune Pollack, MD  Multiple Vitamin (MULTIVITAMIN) tablet Take 1 tablet by mouth daily.    [provider]  nitroGLYCERIN (NITROLINGUAL) 0.4 MG/SPRAY spray Place 1 spray under the tongue every 5 (five) minutes x 3  doses as needed for chest pain.    [provider]  pantoprazole (PROTONIX) 40 MG tablet Take 40 mg by mouth daily.    [provider]      VITAL SIGNS:  Blood pressure (!) 168/51, pulse 66, temperature 99.8 F (37.7 C), temperature source Rectal, resp. rate 20, height  (1.575 m), weight 70 kg, SpO2 95 %.  PHYSICAL EXAMINATION:  Physical Exam  GENERAL:  87 y.o.-year-old Caucasian female patient lying in the bed with no acute distress.  EYES: Pupils equal, round, reactive to light and accommodation. No scleral icterus. Extraocular muscles intact.  HEENT: Head atraumatic, normocephalic. Oropharynx and nasopharynx clear.  NECK:  Supple, no jugular venous distention. No thyroid enlargement, no tenderness.  LUNGS: Diminished bibasal breath sounds with bibasal crackles. CARDIOVASCULAR: Regular rate and rhythm, S1, S2 normal. No murmurs, rubs, or gallops.  ABDOMEN: Soft, nondistended, nontender. Bowel sounds present. No organomegaly or mass.  EXTREMITIES: No  pedal edema, cyanosis, or clubbing.  NEUROLOGIC: She has right-sided hemiparesis with right wrist and fingers contractures PSYCHIATRIC: The patient is alert and cooperative.  Normal affect and good eye contact. SKIN: No obvious rash, lesion, or ulcer.   LABORATORY PANEL:   CBC Recent Labs  Lab 09/06/20 2232  WBC 7.6  HGB 10.6*  HCT 32.2*  PLT 140*   ------------------------------------------------------------------------------------------------------------------  Chemistries  Recent Labs  Lab 09/06/20 2232  NA 137  K 4.0  CL 107  CO2 23  GLUCOSE 116*  BUN 15  CREATININE 0.70  CALCIUM 8.4*  AST 17  ALT 9  ALKPHOS 52  BILITOT 0.8   ------------------------------------------------------------------------------------------------------------------  Cardiac Enzymes No results for input(s): TROPONINI in the last 168  hours. ------------------------------------------------------------------------------------------------------------------  RADIOLOGY:  DG Chest 2 View  Result Date: 09/06/2020 CLINICAL DATA:  Shortness of breath. EXAM: CHEST - 2 VIEW COMPARISON:  09/25/2017 FINDINGS: The cardiomediastinal contours are normal. Aortic atherosclerosis. Pulmonary vasculature is normal. No consolidation, pleural effusion, or pneumothorax. No acute osseous abnormalities are seen. Mild degenerative change of both shoulders. IMPRESSION: No acute chest findings. Aortic Atherosclerosis (ICD10-I70.0). Electronically Signed   By: Narda Rutherford M.D.   On: 09/06/2020 23:16   DG Abdomen 1 View  Result Date: 09/06/2020 CLINICAL DATA:  Abdominal pain.  No bowel movement for 2 days. EXAM: ABDOMEN - 1 VIEW COMPARISON:  None. FINDINGS: Single supine view of the abdomen. There is increased air throughout nondilated small bowel in the central abdomen. Air-filled colon with only minimal formed stool in the right colon. No abnormal rectal distention. Surgical clips in the pelvis. There are vascular calcifications. Possible punctate nonobstructing left renal stone. No acute osseous abnormalities are seen. IMPRESSION: 1. Increased air throughout nondilated small bowel in the central abdomen, may represent ileus or enteritis. No evidence of obstruction. 2. Air-filled colon with only minimal formed stool. Electronically Signed   By: Narda Rutherford M.D.   On: 09/06/2020 23:18   CT Angio Chest PE W and/or Wo Contrast  Result Date: 09/07/2020 CLINICAL DATA:  Dyspnea, wheezing, unspecified abdominal pain, constipation EXAM: CT ANGIOGRAPHY CHEST CT ABDOMEN AND PELVIS WITH CONTRAST TECHNIQUE: Multidetector CT imaging of the chest was performed using the standard protocol during bolus administration of intravenous contrast. Multiplanar CT image reconstructions and MIPs were obtained to evaluate the vascular anatomy. Multidetector CT imaging of the  abdomen and pelvis was performed using the standard protocol during bolus administration of intravenous contrast. CONTRAST:  OMNIPAQUE IOHEXOL 350 MG/ML SOLN COMPARISON:  CTA 01/26/2017 FINDINGS: CTA CHEST FINDINGS Cardiovascular: There is adequate opacification of the pulmonary arterial tree through the segmental level. There is no intraluminal filling defect identified to suggest acute pulmonary embolism. The central pulmonary arteries are enlarged in keeping with changes of pulmonary arterial hypertension, similar to that noted on prior examination. Extensive multi-vessel coronary artery calcification. Global cardiac size is within normal limits. Mild left ventricular hypertrophy is noted. No pericardial effusion. Atherosclerotic calcification is seen within the thoracic aorta. No aortic aneurysm. Mediastinum/Nodes: Visualized thyroid is unremarkable. No pathologic thoracic adenopathy. The esophagus is unremarkable. Small hiatal hernia. Lungs/Pleura: Evaluation of the pulmonary parenchyma is limited by respiratory motion artifact. There is, however, scattered areas of peribronchial infiltrate within the posterior segment of the right upper lobe and right lower lobe as well as centrally within the left lower lobe to a lesser extent most in keeping with atypical infection in the acute setting. There is mild associated bronchial wall thickening noted within the segments in keeping  with airway inflammation. There is mosaic attenuation of the pulmonary parenchyma in keeping with areas of air trapping, asymmetrically affecting the affected regions of the lung mentioned above. No pneumothorax or pleural effusion. No central obstructing lesion. Musculoskeletal: No lytic or blastic bone lesions are identified. No acute bone abnormality. Review of the MIP images confirms the above findings. CT ABDOMEN and PELVIS FINDINGS Hepatobiliary: Liver unremarkable. Gallbladder absent. Mild extrahepatic biliary ductal dilation  is nonspecific but is stable since prior examination and likely simply represents post cholecystectomy change. Pancreas: Unremarkable Spleen: Unremarkable Adrenals/Urinary Tract: The adrenal glands are unremarkable. The kidneys are normal in position. There is asymmetric cortical scarring involving the left kidney. Extrarenal pelvis noted involving the left kidney. The kidneys are otherwise unremarkable. There is circumferential bladder wall thickening and moderate perivesicular inflammatory stranding in keeping with a diffuse infectious or inflammatory cystitis. The bladder is not distended. Stomach/Bowel: The stomach, small bowel, and large bowel are unremarkable. Appendix absent. No free intraperitoneal gas or fluid. Vascular/Lymphatic: Moderate aortoiliac atherosclerotic calcification. Particularly prominent atherosclerotic calcification noted at the origin of the renal arteries bilaterally. No aortic aneurysm. No pathologic adenopathy within the abdomen and pelvis. Reproductive: Status post hysterectomy. No adnexal masses. Numerous surgical clips are seen within the pelvis bilaterally in keeping with probable lymph node dissection. Other: No abdominal wall hernia.  Rectum unremarkable. Musculoskeletal: Osseous structures are age-appropriate. No acute bone abnormality. Review of the MIP images confirms the above findings. IMPRESSION: No pulmonary embolism. Morphologic changes in keeping with pulmonary arterial hypertension, unchanged. Extensive multi-vessel coronary artery calcification. Multifocal pulmonary infiltrates in keeping with changes of atypical infection in the appropriate clinical setting. No central obstructing lesion. Moderate circumferential bladder wall thickening and extensive surrounding inflammatory stranding in keeping with diffuse infectious or inflammatory cystitis. Correlation with urinalysis and urine culture may be helpful. Peripheral vascular disease with extensive atherosclerotic  calcification at the renal ostia bilaterally. The degree of stenosis is not well assessed on this non arteriographic study. Aortic Atherosclerosis (ICD10-I70.0). Electronically Signed   By: Helyn Numbers MD   On: 09/07/2020 03:07   CT ABDOMEN PELVIS W CONTRAST  Result Date: 09/07/2020 CLINICAL DATA:  Dyspnea, wheezing, unspecified abdominal pain, constipation EXAM: CT ANGIOGRAPHY CHEST CT ABDOMEN AND PELVIS WITH CONTRAST TECHNIQUE: Multidetector CT imaging of the chest was performed using the standard protocol during bolus administration of intravenous contrast. Multiplanar CT image reconstructions and MIPs were obtained to evaluate the vascular anatomy. Multidetector CT imaging of the abdomen and pelvis was performed using the standard protocol during bolus administration of intravenous contrast. CONTRAST:  OMNIPAQUE IOHEXOL 350 MG/ML SOLN COMPARISON:  CTA 01/26/2017 FINDINGS: CTA CHEST FINDINGS Cardiovascular: There is adequate opacification of the pulmonary arterial tree through the segmental level. There is no intraluminal filling defect identified to suggest acute pulmonary embolism. The central pulmonary arteries are enlarged in keeping with changes of pulmonary arterial hypertension, similar to that noted on prior examination. Extensive multi-vessel coronary artery calcification. Global cardiac size is within normal limits. Mild left ventricular hypertrophy is noted. No pericardial effusion. Atherosclerotic calcification is seen within the thoracic aorta. No aortic aneurysm. Mediastinum/Nodes: Visualized thyroid is unremarkable. No pathologic thoracic adenopathy. The esophagus is unremarkable. Small hiatal hernia. Lungs/Pleura: Evaluation of the pulmonary parenchyma is limited by respiratory motion artifact. There is, however, scattered areas of peribronchial infiltrate within the posterior segment of the right upper lobe and right lower lobe as well as centrally within the left lower lobe to a  lesser extent most in keeping with  atypical infection in the acute setting. There is mild associated bronchial wall thickening noted within the segments in keeping with airway inflammation. There is mosaic attenuation of the pulmonary parenchyma in keeping with areas of air trapping, asymmetrically affecting the affected regions of the lung mentioned above. No pneumothorax or pleural effusion. No central obstructing lesion. Musculoskeletal: No lytic or blastic bone lesions are identified. No acute bone abnormality. Review of the MIP images confirms the above findings. CT ABDOMEN and PELVIS FINDINGS Hepatobiliary: Liver unremarkable. Gallbladder absent. Mild extrahepatic biliary ductal dilation is nonspecific but is stable since prior examination and likely simply represents post cholecystectomy change. Pancreas: Unremarkable Spleen: Unremarkable Adrenals/Urinary Tract: The adrenal glands are unremarkable. The kidneys are normal in position. There is asymmetric cortical scarring involving the left kidney. Extrarenal pelvis noted involving the left kidney. The kidneys are otherwise unremarkable. There is circumferential bladder wall thickening and moderate perivesicular inflammatory stranding in keeping with a diffuse infectious or inflammatory cystitis. The bladder is not distended. Stomach/Bowel: The stomach, small bowel, and large bowel are unremarkable. Appendix absent. No free intraperitoneal gas or fluid. Vascular/Lymphatic: Moderate aortoiliac atherosclerotic calcification. Particularly prominent atherosclerotic calcification noted at the origin of the renal arteries bilaterally. No aortic aneurysm. No pathologic adenopathy within the abdomen and pelvis. Reproductive: Status post hysterectomy. No adnexal masses. Numerous surgical clips are seen within the pelvis bilaterally in keeping with probable lymph node dissection. Other: No abdominal wall hernia.  Rectum unremarkable. Musculoskeletal: Osseous structures  are age-appropriate. No acute bone abnormality. Review of the MIP images confirms the above findings. IMPRESSION: No pulmonary embolism. Morphologic changes in keeping with pulmonary arterial hypertension, unchanged. Extensive multi-vessel coronary artery calcification. Multifocal pulmonary infiltrates in keeping with changes of atypical infection in the appropriate clinical setting. No central obstructing lesion. Moderate circumferential bladder wall thickening and extensive surrounding inflammatory stranding in keeping with diffuse infectious or inflammatory cystitis. Correlation with urinalysis and urine culture may be helpful. Peripheral vascular disease with extensive atherosclerotic calcification at the renal ostia bilaterally. The degree of stenosis is not well assessed on this non arteriographic study. Aortic Atherosclerosis (ICD10-I70.0). Electronically Signed   By: Helyn Numbers MD   On: 09/07/2020 03:07      IMPRESSION AND PLAN:  Active Problems:   Multifocal pneumonia  1.  Multifocal atypical pneumonia. -The patient will be admitted to a progressive unit bed. -We will continue diet therapy with IV Rocephin and Zithromax. -We will obtain sputum culture as well as pneumonia antigens. -We will follow his blood cultures. -Mucolytic therapy will be provided. -She will be placed on scheduled and as needed duo nebs.  2.  Urinary tract infection. -This should be covered with IV Rocephin. -We will follow urine cultures and sensitivity.  3.  Possible acute on chronic systolic CHF. -The patient will be gently diuresed with IV Lasix. -We will obtain a 2D echo. -Her last full echo was on 01/27/2017 revealing an EF of 30 to 35% with moderate right regurgitation and mild left atrial dilatation.  4.  Ileus. -The patient will be kept n.p.o. except for medications and will be hydrated with IV normal saline. -We will follow two-view abdomen x-ray this afternoon.  5.  Hypertensive urgency.   Blood pressure has been as high as 171/141.  This certainly could be contributing to her acute CHF. -We will continue her antihypertensives and add as needed IV labetalol.  6.  Coronary artery disease. -We will continue her beta-blocker therapy and statin therapy in addition to as needed sublingual  nitroglycerin.  7.  Dyslipidemia. -Statin therapy will be resumed as well as Lovaza.  8.  GERD. -We will continue PPI therapy.  9.  Depression. -We will continue fluoxetine.   DVT prophylaxis: Lovenox. Code Status: The patient is DNR/DNI Family Communication:  The plan of care was discussed in details with the patient (and family). I answered all questions. The patient agreed to proceed with the above mentioned plan. Further management will depend upon hospital course. Disposition Plan: Back to previous home environment Consults called: none.   All the records are reviewed and case discussed with ED provider.  Status is: Inpatient  Remains inpatient appropriate because:Ongoing diagnostic testing needed not appropriate for outpatient work up, Unsafe d/c plan, IV treatments appropriate due to intensity of illness or inability to take PO and Inpatient level of care appropriate due to severity of illness   Dispo: The patient is from: SNF              Anticipated d/c is to: SNF              Patient currently is not medically stable to d/c.   Difficult to place patient No  TOTAL TIME TAKING CARE OF THIS PATIENT: 55 minutes.    Hannah Beat M.D on 09/07/2020 at 4:43 AM  [facility]   From 7 PM-7 AM, contact night-coverage [email or web address]  CC: Primary care physician; Derwood Kaplan, MD

## 2020-09-07 NOTE — Progress Notes (Addendum)
[facility]  - [place] [facility]   PATIENT NAME: Alexandria Hanson    MR#:  [id number]  DATE OF BIRTH:  1929/01/15  SUBJECTIVE:  patient seen in the ER. She is chronic a facial from previous stroke. Could understand few words and 34 she stated. Denies any shortness of breath. No family in the room.  Sats 96 to 97% on room air. No respiratory distress noted. No fever  REVIEW OF SYSTEMS:   Review of Systems  Constitutional: Negative for chills, fever and weight loss.  HENT: Negative for ear discharge, ear pain and nosebleeds.   Eyes: Negative for blurred vision, pain and discharge.  Respiratory: Negative for sputum production, shortness of breath, wheezing and stridor.   Cardiovascular: Negative for chest pain, palpitations, orthopnea and PND.  Gastrointestinal: Negative for abdominal pain, diarrhea, nausea and vomiting.  Genitourinary: Negative for frequency and urgency.  Musculoskeletal: Negative for back pain and joint pain.  Neurological: Negative for sensory change, speech change, focal weakness and weakness.  Psychiatric/Behavioral: Negative for depression and hallucinations. The patient is not nervous/anxious.    Tolerating Diet: Tolerating PT:   DRUG ALLERGIES:  No Known Allergies  VITALS:  Blood pressure 124/65, pulse 61, temperature 98.7 F (37.1 C), temperature source Oral, resp. rate 19, height 5\' 2"  (1.575 m), weight 70 kg, SpO2 93 %.  PHYSICAL EXAMINATION:   Physical Exam limited GENERAL:  87 y.o.-year-old patient lying in the bed with no acute distress.  LUNGS: Normal breath sounds bilaterally, no wheezing, rales, rhonchi. No use of accessory muscles of respiration.  CARDIOVASCULAR: S1, S2 normal. No murmurs, rubs, or gallops.  ABDOMEN: Soft, nontender, nondistended. Bowel sounds present. EXTREMITIES: No cyanosis, clubbing or edema b/l.    NEUROLOGIC: chronic effusion on right sided hemiparesis with contractors and upper extremity. PSYCHIATRIC:   patient is alert  And awake SKIN: No obvious rash, lesion, or ulcer.   LABORATORY PANEL:  CBC Recent Labs  Lab 09/07/20 0501  WBC 8.5  HGB 10.8*  HCT 33.1*  PLT 140*    Chemistries  Recent Labs  Lab 09/06/20 2232 09/07/20 0501  NA 137 138  K 4.0 3.5  CL 107 107  CO2 23 24  GLUCOSE 116* 109*  BUN 15 13  CREATININE 0.70 0.62  CALCIUM 8.4* 8.6*  AST 17  --   ALT 9  --   ALKPHOS 52  --   BILITOT 0.8  --    Cardiac Enzymes No results for input(s): TROPONINI in the last 168 hours. RADIOLOGY:  DG Chest 2 View  Result Date: 09/06/2020 CLINICAL DATA:  Shortness of breath. EXAM: CHEST - 2 VIEW COMPARISON:  09/25/2017 FINDINGS: The cardiomediastinal contours are normal. Aortic atherosclerosis. Pulmonary vasculature is normal. No consolidation, pleural effusion, or pneumothorax. No acute osseous abnormalities are seen. Mild degenerative change of both shoulders. IMPRESSION: No acute chest findings. Aortic Atherosclerosis (ICD10-I70.0). Electronically Signed   By: Narda RutherfordMelanie  Sanford M.D.   On: 09/06/2020 23:16   DG Abdomen 1 View  Result Date: 09/06/2020 CLINICAL DATA:  Abdominal pain.  No bowel movement for 2 days. EXAM: ABDOMEN - 1 VIEW COMPARISON:  None. FINDINGS: Single supine view of the abdomen. There is increased air throughout nondilated small bowel in the central abdomen. Air-filled colon with only minimal formed stool in the right colon. No abnormal rectal distention. Surgical clips in the pelvis. There are vascular calcifications. Possible punctate nonobstructing left renal stone. No acute osseous abnormalities are seen. IMPRESSION: 1. Increased air throughout nondilated small  bowel in the central abdomen, may represent ileus or enteritis. No evidence of obstruction. 2. Air-filled colon with only minimal formed stool. Electronically Signed   By: Narda Rutherford M.D.   On: 09/06/2020 23:18   CT Angio Chest PE W and/or Wo Contrast  Result Date: 09/07/2020 CLINICAL DATA:   Dyspnea, wheezing, unspecified abdominal pain, constipation EXAM: CT ANGIOGRAPHY CHEST CT ABDOMEN AND PELVIS WITH CONTRAST TECHNIQUE: Multidetector CT imaging of the chest was performed using the standard protocol during bolus administration of intravenous contrast. Multiplanar CT image reconstructions and MIPs were obtained to evaluate the vascular anatomy. Multidetector CT imaging of the abdomen and pelvis was performed using the standard protocol during bolus administration of intravenous contrast. CONTRAST:  OMNIPAQUE IOHEXOL 350 MG/ML SOLN COMPARISON:  CTA 01/26/2017 FINDINGS: CTA CHEST FINDINGS Cardiovascular: There is adequate opacification of the pulmonary arterial tree through the segmental level. There is no intraluminal filling defect identified to suggest acute pulmonary embolism. The central pulmonary arteries are enlarged in keeping with changes of pulmonary arterial hypertension, similar to that noted on prior examination. Extensive multi-vessel coronary artery calcification. Global cardiac size is within normal limits. Mild left ventricular hypertrophy is noted. No pericardial effusion. Atherosclerotic calcification is seen within the thoracic aorta. No aortic aneurysm. Mediastinum/Nodes: Visualized thyroid is unremarkable. No pathologic thoracic adenopathy. The esophagus is unremarkable. Small hiatal hernia. Lungs/Pleura: Evaluation of the pulmonary parenchyma is limited by respiratory motion artifact. There is, however, scattered areas of peribronchial infiltrate within the posterior segment of the right upper lobe and right lower lobe as well as centrally within the left lower lobe to a lesser extent most in keeping with atypical infection in the acute setting. There is mild associated bronchial wall thickening noted within the segments in keeping with airway inflammation. There is mosaic attenuation of the pulmonary parenchyma in keeping with areas of air trapping, asymmetrically affecting  the affected regions of the lung mentioned above. No pneumothorax or pleural effusion. No central obstructing lesion. Musculoskeletal: No lytic or blastic bone lesions are identified. No acute bone abnormality. Review of the MIP images confirms the above findings. CT ABDOMEN and PELVIS FINDINGS Hepatobiliary: Liver unremarkable. Gallbladder absent. Mild extrahepatic biliary ductal dilation is nonspecific but is stable since prior examination and likely simply represents post cholecystectomy change. Pancreas: Unremarkable Spleen: Unremarkable Adrenals/Urinary Tract: The adrenal glands are unremarkable. The kidneys are normal in position. There is asymmetric cortical scarring involving the left kidney. Extrarenal pelvis noted involving the left kidney. The kidneys are otherwise unremarkable. There is circumferential bladder wall thickening and moderate perivesicular inflammatory stranding in keeping with a diffuse infectious or inflammatory cystitis. The bladder is not distended. Stomach/Bowel: The stomach, small bowel, and large bowel are unremarkable. Appendix absent. No free intraperitoneal gas or fluid. Vascular/Lymphatic: Moderate aortoiliac atherosclerotic calcification. Particularly prominent atherosclerotic calcification noted at the origin of the renal arteries bilaterally. No aortic aneurysm. No pathologic adenopathy within the abdomen and pelvis. Reproductive: Status post hysterectomy. No adnexal masses. Numerous surgical clips are seen within the pelvis bilaterally in keeping with probable lymph node dissection. Other: No abdominal wall hernia.  Rectum unremarkable. Musculoskeletal: Osseous structures are age-appropriate. No acute bone abnormality. Review of the MIP images confirms the above findings. IMPRESSION: No pulmonary embolism. Morphologic changes in keeping with pulmonary arterial hypertension, unchanged. Extensive multi-vessel coronary artery calcification. Multifocal pulmonary infiltrates in  keeping with changes of atypical infection in the appropriate clinical setting. No central obstructing lesion. Moderate circumferential bladder wall thickening and extensive surrounding inflammatory stranding in  keeping with diffuse infectious or inflammatory cystitis. Correlation with urinalysis and urine culture may be helpful. Peripheral vascular disease with extensive atherosclerotic calcification at the renal ostia bilaterally. The degree of stenosis is not well assessed on this non arteriographic study. Aortic Atherosclerosis (ICD10-I70.0). Electronically Signed   By: Helyn Numbers MD   On: 09/07/2020 03:07   CT ABDOMEN PELVIS W CONTRAST  Result Date: 09/07/2020 CLINICAL DATA:  Dyspnea, wheezing, unspecified abdominal pain, constipation EXAM: CT ANGIOGRAPHY CHEST CT ABDOMEN AND PELVIS WITH CONTRAST TECHNIQUE: Multidetector CT imaging of the chest was performed using the standard protocol during bolus administration of intravenous contrast. Multiplanar CT image reconstructions and MIPs were obtained to evaluate the vascular anatomy. Multidetector CT imaging of the abdomen and pelvis was performed using the standard protocol during bolus administration of intravenous contrast. CONTRAST:  OMNIPAQUE IOHEXOL 350 MG/ML SOLN COMPARISON:  CTA 01/26/2017 FINDINGS: CTA CHEST FINDINGS Cardiovascular: There is adequate opacification of the pulmonary arterial tree through the segmental level. There is no intraluminal filling defect identified to suggest acute pulmonary embolism. The central pulmonary arteries are enlarged in keeping with changes of pulmonary arterial hypertension, similar to that noted on prior examination. Extensive multi-vessel coronary artery calcification. Global cardiac size is within normal limits. Mild left ventricular hypertrophy is noted. No pericardial effusion. Atherosclerotic calcification is seen within the thoracic aorta. No aortic aneurysm. Mediastinum/Nodes: Visualized thyroid is  unremarkable. No pathologic thoracic adenopathy. The esophagus is unremarkable. Small hiatal hernia. Lungs/Pleura: Evaluation of the pulmonary parenchyma is limited by respiratory motion artifact. There is, however, scattered areas of peribronchial infiltrate within the posterior segment of the right upper lobe and right lower lobe as well as centrally within the left lower lobe to a lesser extent most in keeping with atypical infection in the acute setting. There is mild associated bronchial wall thickening noted within the segments in keeping with airway inflammation. There is mosaic attenuation of the pulmonary parenchyma in keeping with areas of air trapping, asymmetrically affecting the affected regions of the lung mentioned above. No pneumothorax or pleural effusion. No central obstructing lesion. Musculoskeletal: No lytic or blastic bone lesions are identified. No acute bone abnormality. Review of the MIP images confirms the above findings. CT ABDOMEN and PELVIS FINDINGS Hepatobiliary: Liver unremarkable. Gallbladder absent. Mild extrahepatic biliary ductal dilation is nonspecific but is stable since prior examination and likely simply represents post cholecystectomy change. Pancreas: Unremarkable Spleen: Unremarkable Adrenals/Urinary Tract: The adrenal glands are unremarkable. The kidneys are normal in position. There is asymmetric cortical scarring involving the left kidney. Extrarenal pelvis noted involving the left kidney. The kidneys are otherwise unremarkable. There is circumferential bladder wall thickening and moderate perivesicular inflammatory stranding in keeping with a diffuse infectious or inflammatory cystitis. The bladder is not distended. Stomach/Bowel: The stomach, small bowel, and large bowel are unremarkable. Appendix absent. No free intraperitoneal gas or fluid. Vascular/Lymphatic: Moderate aortoiliac atherosclerotic calcification. Particularly prominent atherosclerotic calcification noted  at the origin of the renal arteries bilaterally. No aortic aneurysm. No pathologic adenopathy within the abdomen and pelvis. Reproductive: Status post hysterectomy. No adnexal masses. Numerous surgical clips are seen within the pelvis bilaterally in keeping with probable lymph node dissection. Other: No abdominal wall hernia.  Rectum unremarkable. Musculoskeletal: Osseous structures are age-appropriate. No acute bone abnormality. Review of the MIP images confirms the above findings. IMPRESSION: No pulmonary embolism. Morphologic changes in keeping with pulmonary arterial hypertension, unchanged. Extensive multi-vessel coronary artery calcification. Multifocal pulmonary infiltrates in keeping with changes of atypical infection in the  appropriate clinical setting. No central obstructing lesion. Moderate circumferential bladder wall thickening and extensive surrounding inflammatory stranding in keeping with diffuse infectious or inflammatory cystitis. Correlation with urinalysis and urine culture may be helpful. Peripheral vascular disease with extensive atherosclerotic calcification at the renal ostia bilaterally. The degree of stenosis is not well assessed on this non arteriographic study. Aortic Atherosclerosis (ICD10-I70.0). Electronically Signed   By: Helyn Numbers MD   On: 09/07/2020 03:07   DG Abd 2 Views  Result Date: 09/07/2020 CLINICAL DATA:  87 year old female with abdominal pain, shortness of breath. Evidence of urinary bladder cystitis on CT. EXAM: ABDOMEN - 2 VIEW COMPARISON:  CT Chest, Abdomen, and Pelvis earlier today. KUB 09/06/2020. FINDINGS: Upright and supine views of the abdomen and pelvis. Excreted IV contrast in the renal collecting systems and bladder. Asymmetric appearance of the left renal pelvis similar to the CT earlier today where there is questionable urothelial thickening. No overt hydronephrosis. No pneumoperitoneum. Non obstructed bowel gas pattern. Stable lower chest. Stable  visualized osseous structures. Bilateral pelvic sidewall surgical clips again noted. IMPRESSION: 1. Excreted IV contrast in the renal collecting systems and bladder, with asymmetry of the left renal pelvis and question of infectious or inflammatory urothelial thickening there when correlated to the CT earlier today. 2.  Normal bowel gas pattern, no free air. Electronically Signed   By: Odessa Fleming M.D.   On: 09/07/2020 07:45   ASSESSMENT AND PLAN:  Omelia Marquart is a 87 y.o. female with history of CAD, CVA, CHF who presents to the emergency department from her nursing facility for concerns of shortness of breath. Sats were 90%.  Does not wear oxygen.  No known history of asthma or COPD.  Does have documented history of CHF.  Sepsis POA Suspected due to Acute interstitial cystitis (CT abd and abnormal urine) and less likely PNA --fever 101.4, RR >20, abnormal UA and CT abd showing thickened bladder wall /cyctitis -IV Rocephin 2 g qd -- Pro calcitonin negative less likely pneumonia given no symptoms of cough or shortness of breath today -- sats more than 92% on room air. No respiratory distress -- blood culture negative -- follow-up urine culture   Possible acute on chronic systolic CHF. - gently diuresed with IV Lasix. --BNP 525 -cont bb, lisinopril -We will obtain a 2D echo. -Her last full echo was on 01/27/2017 revealing an EF of 30 to 35% with moderate right regurgitation and mild left atrial dilatation.  Hypertensive urgency--improved -- Blood pressure has been as high as 171/141.  This certainly could be contributing to her acute CHF. -- continue her antihypertensives and add as needed IV labetalol.  Coronary artery disease. - continue her beta-blocker,statin therapy in addition to as needed sublingual nitroglycerin.   Dyslipidemia. -Statin therapy and Lovaza.   GERD. -continue PPI therapy.   Depression. - continue fluoxetine.  H/o CVA with aphasia and hemiparesis --on  asa  DVT prophylaxis: Lovenox. Code Status: patient is DNR/DNI   Procedures: Family communication :tried calling dter nancy murphy--no answer Consults :none CODE STATUS: DNR/DNI DVT Prophylaxis : Level of care: Progressive Cardiac Status is: Inpatient  Remains inpatient appropriate because:Inpatient level of care appropriate due to severity of illness   Dispo: The patient is from: [facility]              Anticipated d/c is to: TBD              Patient currently is not medically stable to d/c.   Difficult to  place patient No        TOTAL TIME TAKING CARE OF THIS PATIENT: 35 minutes.  >50% time spent on counselling and coordination of care  Note: This dictation was prepared with Dragon dictation along with smaller phrase technology. Any transcriptional errors that result from this process are unintentional.  Enedina Finner M.D    [facility]   CC: Primary care physician; Derwood Kaplan, MDPatient ID: Alexandria Poag, female   DOB: [birth year], 87 y.o.   MRN: [id number]

## 2020-09-07 NOTE — ED Notes (Signed)
Patient IV site swollen and erythema noted. PIV removed. MD notified and instructed RN to apply cold compress and elevate extremity. IV team to be consulted for access.

## 2020-09-07 NOTE — ED Notes (Signed)
Pt scooted up in bed at this time, new linens placed,warm blanket given. Fall alarm confirmed to be in place and on at this time, pt has call bell within reach, and has been reminded that she has a purewick at this time to urinate - pt nodded in understanding at this time.

## 2020-09-07 NOTE — ED Notes (Signed)
Patient labs collected by phlebotomist at this time.

## 2020-09-07 NOTE — ED Notes (Signed)
Patient transported to X-ray 

## 2020-09-07 NOTE — ED Provider Notes (Addendum)
[facility] Emergency Department Provider Note  ____________________________________________   Event Date/Time   First MD Initiated Contact with Patient 09/07/20 0036     (approximate)  I have reviewed the triage vital signs and the nursing notes.   HISTORY  Chief Complaint Abdominal Pain  LEVEL V CAVEAT DUE TO APHASIA  HPI Alexandria Hanson is a 87 y.o. female with history of CAD, CVA, CHF who presents to the emergency department from her nursing facility for concerns of shortness of breath.    Spoke with Kenney Housemananya at [facility].  She reports that she received report tonight that the patient appeared as if she was SOB and couldn't speak without "gasping for breath".  Sats were 90%.  Does not wear oxygen.  No known history of asthma or COPD.  Does have documented history of CHF.  Has aphasia and WC/bed bound from previous stroke.  No falls, cough, vomiting, diarrhea per staff at nursing home.  Echo July 2018:  Study Conclusions   - Left ventricle: The cavity size was moderately dilated. Systolic  function was moderately to severely reduced. The estimated  ejection fraction was in the range of 30% to 35%. Akinesis of the  anteroseptal myocardium. Akinesis of the anterior myocardium.  - Mitral valve: There was moderate regurgitation.  - Left atrium: The atrium was mildly dilated.     Past Medical History:  Diagnosis Date  . Anxiety   . Bleeding in brain [facility](HCC)    s/p fall  . CAD (coronary artery disease)    s/p MI 30 yrs ago  . Cerebral artery occlusion   . Depression   . Hypertension   . MI (mitral incompetence)   . Stroke (HCC) 2001  . Stroke [facility](HCC)     Patient Active Problem List   Diagnosis Date Noted  . Elevated troponin 03/22/2017  . ASCVD (arteriosclerotic cardiovascular disease) 03/22/2017  . Chest pain 01/26/2017  . NSTEMI (non-ST elevated myocardial infarction) (HCC) 01/26/2017  . Stroke (HCC) 03/08/2016  . Depression   .  Anxiety   . Hypertension     Past Surgical History:  Procedure Laterality Date  . ABDOMINAL HYSTERECTOMY    . APPENDECTOMY    . CHOLECYSTECTOMY    . COLON SURGERY     knot on colon removed    Prior to Admission medications   Medication Sig Start Date End Date Taking? Authorizing Provider  acetaminophen (TYLENOL) 325 MG tablet Take 650 mg by mouth every 4 (four) hours as needed for mild pain or moderate pain.    [provider]  Alum & Mag Hydroxide-Simeth (GI COCKTAIL) SUSP suspension Take 30 mLs by mouth 3 (three) times daily as needed for indigestion. Shake well. 01/28/17   Shaune Pollackhen, Qing, MD  aspirin 81 MG chewable tablet Chew 81 mg by mouth daily.    [provider]  atorvastatin (LIPITOR) 40 MG tablet Take 1 tablet (40 mg total) by mouth daily at 6 PM. 03/11/16   Auburn BilberryPatel, Shreyang, MD  clopidogrel (PLAVIX) 75 MG tablet Take 1 tablet (75 mg total) by mouth daily. 03/11/16   Auburn BilberryPatel, Shreyang, MD  FLUoxetine (PROZAC) 10 MG capsule Take 10 mg by mouth daily.    [provider]  guaifenesin (ROBITUSSIN) 100 MG/5ML syrup Take 100 mg by mouth every 4 (four) hours as needed for cough.    [provider]  hydrochlorothiazide (HYDRODIURIL) 25 MG tablet Take 1 tablet (25 mg total) by mouth daily. 01/18/16   Gabriel CirriWicker, Cheryl, NP  isosorbide mononitrate (IMDUR) 30 MG 24 hr tablet Take 30 mg by mouth daily.    [provider]  lisinopril (PRINIVIL,ZESTRIL) 5 MG tablet Take 5 mg by mouth daily.    [provider]  metoCLOPramide (REGLAN) 5 MG tablet Take 5 mg by mouth every evening.    [provider]  metoprolol tartrate (LOPRESSOR) 25 MG tablet Take 1 tablet (25 mg total) by mouth 2 (two) times daily. 01/28/17   Shaune Pollack, MD  Multiple Vitamin (MULTIVITAMIN) tablet Take 1 tablet by mouth daily.    [provider]  nitroGLYCERIN (NITROLINGUAL) 0.4 MG/SPRAY spray Place 1 spray under the tongue every 5 (five) minutes x 3 doses as needed for  chest pain.    [provider]  pantoprazole (PROTONIX) 40 MG tablet Take 40 mg by mouth daily.    [provider]    Allergies Patient has no known allergies.  Family History  Problem Relation Age of Onset  . CVA Mother   . CAD Father   . CAD Sister   . Hyperlipidemia Mother   . Hypertension Mother   . Diabetes Sister   . Hypertension Sister     Social History Social History   Tobacco Use  . Smoking status: Never Smoker  . Smokeless tobacco: Never Used  Vaping Use  . Vaping Use: Never used  Substance Use Topics  . Alcohol use: No  . Drug use: No    Review of Systems - level V caveat due to aphasia  ____________________________________________   PHYSICAL EXAM:  VITAL SIGNS: ED Triage Vitals  Enc Vitals Group     BP 09/06/20 2223 (!) 177/63     Pulse Rate 09/06/20 2223 77     Resp 09/06/20 2223 20     Temp 09/06/20 2223 98.2 F (36.8 C)     Temp Source 09/06/20 2223 Oral     SpO2 09/06/20 2214 97 %     Weight 09/06/20 2225 152 lb 1.9 oz (69 kg)     Height --      Head Circumference --      Peak Flow --      Pain Score --      Pain Loc --      Pain Edu? --      Excl. in GC? --    CONSTITUTIONAL: Alert, elderly, appears short of breath and slightly uncomfortable, unable to provide history due to aphasia HEAD: Normocephalic EYES: Conjunctivae clear, pupils appear equal, EOM appear intact ENT: normal nose; moist mucous membranes NECK: Supple, normal ROM CARD: RRR; S1 and S2 appreciated; no murmurs, no clicks, no rubs, no gallops RESP: Patient is tachypneic with inspiratory and expiratory wheezes, no rhonchi or rales, no hypoxia ABD/GI: Hyperactive bowel sounds; non-distended; soft, appears to be tender to palpation diffusely without guarding BACK: The back appears normal EXT: Normal ROM in all joints; no deformity noted, no edema; no cyanosis SKIN: Normal color for age and race; warm; no rash on exposed skin NEURO: No facial asymmetry,  + aphasia and dysarthria, right-sided weakness and contractures PSYCH: The patient's mood and manner are appropriate.  ____________________________________________   LABS (all labs ordered are listed, but only abnormal results are displayed)  Labs Reviewed  CBC - Abnormal; Notable for the following components:      Result Value   RBC 3.32 (*)    Hemoglobin 10.6 (*)    HCT 32.2 (*)    Platelets 140 (*)    All other components  within normal limits  COMPREHENSIVE METABOLIC PANEL - Abnormal; Notable for the following components:   Glucose, Bld 116 (*)    Calcium 8.4 (*)    Albumin 3.4 (*)    All other components within normal limits  URINALYSIS, COMPLETE (UACMP) WITH MICROSCOPIC - Abnormal; Notable for the following components:   Color, Urine YELLOW (*)    APPearance HAZY (*)    Hgb urine dipstick SMALL (*)    Protein, ur 30 (*)    Nitrite POSITIVE (*)    Leukocytes,Ua LARGE (*)    WBC, UA >50 (*)    Bacteria, UA MANY (*)    All other components within normal limits  BRAIN NATRIURETIC PEPTIDE - Abnormal; Notable for the following components:   B Natriuretic Peptide 524.4 (*)    All other components within normal limits  RESP PANEL BY RT-PCR (FLU A&B, COVID) ARPGX2  CULTURE, BLOOD (ROUTINE X 2)  CULTURE, BLOOD (ROUTINE X 2)  URINE CULTURE  LIPASE, BLOOD  LACTIC ACID, PLASMA  PROCALCITONIN  TROPONIN I (HIGH SENSITIVITY)  TROPONIN I (HIGH SENSITIVITY)   ____________________________________________  EKG   EKG Interpretation  Date/Time:  Thursday September 06 2020 22:27:57 EST Ventricular Rate:  77 PR Interval:    QRS Duration: 138 QT Interval:  436 QTC Calculation: 493 R Axis:   -82 Text Interpretation: Normal sinus rhythm RBBB and LAFB Abnormal ECG Artifact No significant change since last tracing Confirmed by Rochele Raring [phone]) on 09/07/2020 1:12:23 AM       ____________________________________________  RADIOLOGY Normajean Baxter Demorio Seeley, personally viewed and  evaluated these images (plain radiographs) as part of my medical decision making, as well as reviewing the written report by the radiologist.  ED MD interpretation: Multifocal pneumonia versus pulmonary edema.  Official radiology report(s): DG Chest 2 View  Result Date: 09/06/2020 CLINICAL DATA:  Shortness of breath. EXAM: CHEST - 2 VIEW COMPARISON:  09/25/2017 FINDINGS: The cardiomediastinal contours are normal. Aortic atherosclerosis. Pulmonary vasculature is normal. No consolidation, pleural effusion, or pneumothorax. No acute osseous abnormalities are seen. Mild degenerative change of both shoulders. IMPRESSION: No acute chest findings. Aortic Atherosclerosis (ICD10-I70.0). Electronically Signed   By: Narda Rutherford M.D.   On: 09/06/2020 23:16   DG Abdomen 1 View  Result Date: 09/06/2020 CLINICAL DATA:  Abdominal pain.  No bowel movement for 2 days. EXAM: ABDOMEN - 1 VIEW COMPARISON:  None. FINDINGS: Single supine view of the abdomen. There is increased air throughout nondilated small bowel in the central abdomen. Air-filled colon with only minimal formed stool in the right colon. No abnormal rectal distention. Surgical clips in the pelvis. There are vascular calcifications. Possible punctate nonobstructing left renal stone. No acute osseous abnormalities are seen. IMPRESSION: 1. Increased air throughout nondilated small bowel in the central abdomen, may represent ileus or enteritis. No evidence of obstruction. 2. Air-filled colon with only minimal formed stool. Electronically Signed   By: Narda Rutherford M.D.   On: 09/06/2020 23:18   CT Angio Chest PE W and/or Wo Contrast  Result Date: 09/07/2020 CLINICAL DATA:  Dyspnea, wheezing, unspecified abdominal pain, constipation EXAM: CT ANGIOGRAPHY CHEST CT ABDOMEN AND PELVIS WITH CONTRAST TECHNIQUE: Multidetector CT imaging of the chest was performed using the standard protocol during bolus administration of intravenous contrast. Multiplanar CT  image reconstructions and MIPs were obtained to evaluate the vascular anatomy. Multidetector CT imaging of the abdomen and pelvis was performed using the standard protocol during bolus administration of intravenous contrast. CONTRAST:  OMNIPAQUE IOHEXOL 350 MG/ML  SOLN COMPARISON:  CTA 01/26/2017 FINDINGS: CTA CHEST FINDINGS Cardiovascular: There is adequate opacification of the pulmonary arterial tree through the segmental level. There is no intraluminal filling defect identified to suggest acute pulmonary embolism. The central pulmonary arteries are enlarged in keeping with changes of pulmonary arterial hypertension, similar to that noted on prior examination. Extensive multi-vessel coronary artery calcification. Global cardiac size is within normal limits. Mild left ventricular hypertrophy is noted. No pericardial effusion. Atherosclerotic calcification is seen within the thoracic aorta. No aortic aneurysm. Mediastinum/Nodes: Visualized thyroid is unremarkable. No pathologic thoracic adenopathy. The esophagus is unremarkable. Small hiatal hernia. Lungs/Pleura: Evaluation of the pulmonary parenchyma is limited by respiratory motion artifact. There is, however, scattered areas of peribronchial infiltrate within the posterior segment of the right upper lobe and right lower lobe as well as centrally within the left lower lobe to a lesser extent most in keeping with atypical infection in the acute setting. There is mild associated bronchial wall thickening noted within the segments in keeping with airway inflammation. There is mosaic attenuation of the pulmonary parenchyma in keeping with areas of air trapping, asymmetrically affecting the affected regions of the lung mentioned above. No pneumothorax or pleural effusion. No central obstructing lesion. Musculoskeletal: No lytic or blastic bone lesions are identified. No acute bone abnormality. Review of the MIP images confirms the above findings. CT ABDOMEN and  PELVIS FINDINGS Hepatobiliary: Liver unremarkable. Gallbladder absent. Mild extrahepatic biliary ductal dilation is nonspecific but is stable since prior examination and likely simply represents post cholecystectomy change. Pancreas: Unremarkable Spleen: Unremarkable Adrenals/Urinary Tract: The adrenal glands are unremarkable. The kidneys are normal in position. There is asymmetric cortical scarring involving the left kidney. Extrarenal pelvis noted involving the left kidney. The kidneys are otherwise unremarkable. There is circumferential bladder wall thickening and moderate perivesicular inflammatory stranding in keeping with a diffuse infectious or inflammatory cystitis. The bladder is not distended. Stomach/Bowel: The stomach, small bowel, and large bowel are unremarkable. Appendix absent. No free intraperitoneal gas or fluid. Vascular/Lymphatic: Moderate aortoiliac atherosclerotic calcification. Particularly prominent atherosclerotic calcification noted at the origin of the renal arteries bilaterally. No aortic aneurysm. No pathologic adenopathy within the abdomen and pelvis. Reproductive: Status post hysterectomy. No adnexal masses. Numerous surgical clips are seen within the pelvis bilaterally in keeping with probable lymph node dissection. Other: No abdominal wall hernia.  Rectum unremarkable. Musculoskeletal: Osseous structures are age-appropriate. No acute bone abnormality. Review of the MIP images confirms the above findings. IMPRESSION: No pulmonary embolism. Morphologic changes in keeping with pulmonary arterial hypertension, unchanged. Extensive multi-vessel coronary artery calcification. Multifocal pulmonary infiltrates in keeping with changes of atypical infection in the appropriate clinical setting. No central obstructing lesion. Moderate circumferential bladder wall thickening and extensive surrounding inflammatory stranding in keeping with diffuse infectious or inflammatory cystitis. Correlation  with urinalysis and urine culture may be helpful. Peripheral vascular disease with extensive atherosclerotic calcification at the renal ostia bilaterally. The degree of stenosis is not well assessed on this non arteriographic study. Aortic Atherosclerosis (ICD10-I70.0). Electronically Signed   By: Helyn Numbers MD   On: 09/07/2020 03:07   CT ABDOMEN PELVIS W CONTRAST  Result Date: 09/07/2020 CLINICAL DATA:  Dyspnea, wheezing, unspecified abdominal pain, constipation EXAM: CT ANGIOGRAPHY CHEST CT ABDOMEN AND PELVIS WITH CONTRAST TECHNIQUE: Multidetector CT imaging of the chest was performed using the standard protocol during bolus administration of intravenous contrast. Multiplanar CT image reconstructions and MIPs were obtained to evaluate the vascular anatomy. Multidetector CT imaging of the abdomen and pelvis was  performed using the standard protocol during bolus administration of intravenous contrast. CONTRAST:  OMNIPAQUE IOHEXOL 350 MG/ML SOLN COMPARISON:  CTA 01/26/2017 FINDINGS: CTA CHEST FINDINGS Cardiovascular: There is adequate opacification of the pulmonary arterial tree through the segmental level. There is no intraluminal filling defect identified to suggest acute pulmonary embolism. The central pulmonary arteries are enlarged in keeping with changes of pulmonary arterial hypertension, similar to that noted on prior examination. Extensive multi-vessel coronary artery calcification. Global cardiac size is within normal limits. Mild left ventricular hypertrophy is noted. No pericardial effusion. Atherosclerotic calcification is seen within the thoracic aorta. No aortic aneurysm. Mediastinum/Nodes: Visualized thyroid is unremarkable. No pathologic thoracic adenopathy. The esophagus is unremarkable. Small hiatal hernia. Lungs/Pleura: Evaluation of the pulmonary parenchyma is limited by respiratory motion artifact. There is, however, scattered areas of peribronchial infiltrate within the posterior  segment of the right upper lobe and right lower lobe as well as centrally within the left lower lobe to a lesser extent most in keeping with atypical infection in the acute setting. There is mild associated bronchial wall thickening noted within the segments in keeping with airway inflammation. There is mosaic attenuation of the pulmonary parenchyma in keeping with areas of air trapping, asymmetrically affecting the affected regions of the lung mentioned above. No pneumothorax or pleural effusion. No central obstructing lesion. Musculoskeletal: No lytic or blastic bone lesions are identified. No acute bone abnormality. Review of the MIP images confirms the above findings. CT ABDOMEN and PELVIS FINDINGS Hepatobiliary: Liver unremarkable. Gallbladder absent. Mild extrahepatic biliary ductal dilation is nonspecific but is stable since prior examination and likely simply represents post cholecystectomy change. Pancreas: Unremarkable Spleen: Unremarkable Adrenals/Urinary Tract: The adrenal glands are unremarkable. The kidneys are normal in position. There is asymmetric cortical scarring involving the left kidney. Extrarenal pelvis noted involving the left kidney. The kidneys are otherwise unremarkable. There is circumferential bladder wall thickening and moderate perivesicular inflammatory stranding in keeping with a diffuse infectious or inflammatory cystitis. The bladder is not distended. Stomach/Bowel: The stomach, small bowel, and large bowel are unremarkable. Appendix absent. No free intraperitoneal gas or fluid. Vascular/Lymphatic: Moderate aortoiliac atherosclerotic calcification. Particularly prominent atherosclerotic calcification noted at the origin of the renal arteries bilaterally. No aortic aneurysm. No pathologic adenopathy within the abdomen and pelvis. Reproductive: Status post hysterectomy. No adnexal masses. Numerous surgical clips are seen within the pelvis bilaterally in keeping with probable lymph  node dissection. Other: No abdominal wall hernia.  Rectum unremarkable. Musculoskeletal: Osseous structures are age-appropriate. No acute bone abnormality. Review of the MIP images confirms the above findings. IMPRESSION: No pulmonary embolism. Morphologic changes in keeping with pulmonary arterial hypertension, unchanged. Extensive multi-vessel coronary artery calcification. Multifocal pulmonary infiltrates in keeping with changes of atypical infection in the appropriate clinical setting. No central obstructing lesion. Moderate circumferential bladder wall thickening and extensive surrounding inflammatory stranding in keeping with diffuse infectious or inflammatory cystitis. Correlation with urinalysis and urine culture may be helpful. Peripheral vascular disease with extensive atherosclerotic calcification at the renal ostia bilaterally. The degree of stenosis is not well assessed on this non arteriographic study. Aortic Atherosclerosis (ICD10-I70.0). Electronically Signed   By: Helyn Numbers MD   On: 09/07/2020 03:07    ____________________________________________   PROCEDURES  Procedure(s) performed (including Critical Care):  Procedures  CRITICAL CARE Performed by: Rochele Raring   Total critical care time: 45 minutes  Critical care time was exclusive of separately billable procedures and treating other patients.  Critical care was necessary to treat or prevent  imminent or life-threatening deterioration.  Critical care was time spent personally by me on the following activities: development of treatment plan with patient and/or surrogate as well as nursing, discussions with consultants, evaluation of patient's response to treatment, examination of patient, obtaining history from patient or surrogate, ordering and performing treatments and interventions, ordering and review of laboratory studies, ordering and review of radiographic studies, pulse oximetry and re-evaluation of patient's  condition.  ____________________________________________   INITIAL IMPRESSION / ASSESSMENT AND PLAN / ED COURSE  As part of my medical decision making, I reviewed the following data within the electronic MEDICAL RECORD NUMBER Notes from prior ED visits and [place] Controlled Substance Database         Patient here with concern for shortness of breath from her nursing facility.  She is unable to provide history due to aphasia from previous CVA.  Apparently she told triage staff that she was having abdominal pain with no bowel movement for 2 days.  She is unable to provide any history to me at this time.  Does appear slightly tachypneic with wheezing and has history of CHF but no history of asthma, COPD.  Chest x-ray is clear.  She is high risk for PE given she is bedbound, wheelchair-bound.  Will obtain CTA of the chest.  Differential also includes CHF exacerbation, pneumonia, COVID-19.  She feels very warm to touch.  Will check rectal temperature.  Will give DuoNeb and reassess.  She is not requiring oxygen at this time.  First troponin is normal.  EKG shows no new ischemic change compared to previous.  As for her abdominal pain, x-ray concerning for possible ileus.  She has hyperactive bowel sounds on my exam.  Will obtain CT of the abdomen pelvis for further evaluation.  May be constipation, SBO although less likely given x-ray findings, colitis, diverticulitis, appendicitis, UTI.  LFTs, lipase normal.  Creatinine normal.  No leukocytosis.  ED PROGRESS  1:53 AM  Patient has a rectal temp of 101.  Will add on lactic, blood cultures, urine culture.  Will obtain Covid and influenza swabs.  She is hypertensive.  No hypotension documented.  4:02 AM  Pt's lactic is normal at 1.4.  COVID and flu negative.  Urine is positive for nitrites, large leukocytes and greater than 50 white blood cells with many bacteria.  Culture pending.  She has previously grown E. Coli sensitive to cephalosporins.  Will give  Rocephin.  CT scan showed no PE.  She does have multifocal pulmonary infiltrates consistent with atypical infection.  We will add on azithromycin for community-acquired pneumonia.  She also has moderate circumferential bladder wall thickening with surrounding inflammatory stranding consistent with acute UTI.  Will discuss with medicine for admission.  4:12 AM Discussed patient's case with hospitalist, Dr. Arville Care.  I have recommended admission and patient (and family if present) agree with this plan. Admitting physician will place admission orders.   I reviewed all nursing notes, vitals, pertinent previous records and reviewed/interpreted all EKGs, lab and urine results, imaging (as available).   ____________________________________________   FINAL CLINICAL IMPRESSION(S) / ED DIAGNOSES  Final diagnoses:  Multifocal pneumonia  Acute UTI     ED Discharge Orders    None      *Please note:  Alexandria Hanson was evaluated in Emergency Department on 09/07/2020 for the symptoms described in the history of present illness. She was evaluated in the context of the global COVID-19 pandemic, which necessitated consideration that the patient might be at risk for  infection with the SARS-CoV-2 virus that causes COVID-19. Institutional protocols and algorithms that pertain to the evaluation of patients at risk for COVID-19 are in a state of rapid change based on information released by regulatory bodies including the CDC and federal and state organizations. These policies and algorithms were followed during the patient's care in the ED.  Some ED evaluations and interventions may be delayed as a result of limited staffing during and the pandemic.*   Note:  This document was prepared using Dragon voice recognition software and may include unintentional dictation errors.   Guillermo Nehring, Layla Maw, DO 09/07/20 0413    Harel Repetto, Layla Maw, DO 09/07/20 0425

## 2020-09-07 NOTE — ED Notes (Signed)
IV team at bedside 

## 2020-09-07 NOTE — ED Notes (Signed)
Patient phlebotomy attempted x2 by this RN without success. Lab contacted for assistance. Dr. Elesa Massed notified.

## 2020-09-07 NOTE — ED Notes (Signed)
Pt cleaned after incontinence episode at this time. Clean chux and brief applied at this time.

## 2020-09-08 ENCOUNTER — Inpatient Hospital Stay
Admit: 2020-09-08 | Discharge: 2020-09-08 | Disposition: A | Discharge status: Home / Self Care | Payer: Medicare Other | Attending: Family Medicine | Admitting: Family Medicine

## 2020-09-08 DIAGNOSIS — I10 Essential (primary) hypertension: Secondary | ICD-10-CM

## 2020-09-08 MED ORDER — IPRATROPIUM-ALBUTEROL 0.5-2.5 (3) MG/3ML IN SOLN: 3.0000 mL | Freq: Three times a day (TID) | RESPIRATORY_TRACT | Status: DC

## 2020-09-08 MED ORDER — HYDRALAZINE HCL 20 MG/ML IJ SOLN: 10.0000 mg | Freq: Four times a day (QID) | INTRAMUSCULAR | Status: DC | PRN

## 2020-09-08 MED ORDER — IPRATROPIUM-ALBUTEROL 0.5-2.5 (3) MG/3ML IN SOLN: 3.0000 mL | Freq: Four times a day (QID) | RESPIRATORY_TRACT | Status: DC | PRN

## 2020-09-08 NOTE — Progress Notes (Signed)
*  PRELIMINARY RESULTS* Echocardiogram 2D Echocardiogram has been performed.  Alexandria Hanson Alexandria Hanson 09/08/2020, 12:01 PM

## 2020-09-08 NOTE — Progress Notes (Signed)
[facility]  - [facility]   PATIENT NAME: Alexandria Hanson    MR#:  [id number]  DATE OF BIRTH:  [birth year]  SUBJECTIVE:  patient seen in the ER. She is chronic a facial from previous stroke. Could understand few words she stated. Denies any shortness of breath. No family in the room.  Sats 96 to 97% on room air. No respiratory distress noted. No fever No new issues per RN  REVIEW OF SYSTEMS:   Review of Systems  Unable to perform ROS: Mental acuity   Tolerating Diet:yes Tolerating PT: bedbound  DRUG ALLERGIES:  No Known Allergies  VITALS:  Blood pressure (!) 181/99, pulse (!) 105, temperature 98.7 F (37.1 C), temperature source Oral, resp. rate 20, height 5\' 2"  (1.575 m), weight 68.6 kg, SpO2 92 %.  PHYSICAL EXAMINATION:   Physical Exam limited GENERAL:  87 y.o.-year-old patient lying in the bed with no acute distress.  LUNGS: Normal breath sounds bilaterally, no wheezing, rales, rhonchi. No use of accessory muscles of respiration.  CARDIOVASCULAR: S1, S2 normal. No murmurs, rubs, or gallops.  ABDOMEN: Soft, nontender, nondistended. Bowel sounds present. EXTREMITIES: No cyanosis, clubbing or edema b/l.    NEUROLOGIC: chronic weakness on right with hemiparesis with contractures and upper extremity. PSYCHIATRIC:  patient is alert and awake, pleasantly confused SKIN: No obvious rash, lesion, or ulcer.   LABORATORY PANEL:  CBC Recent Labs  Lab 09/07/20 0501  WBC 8.5  HGB 10.8*  HCT 33.1*  PLT 140*    Chemistries  Recent Labs  Lab 09/06/20 2232 09/07/20 0501  NA 137 138  K 4.0 3.5  CL 107 107  CO2 23 24  GLUCOSE 116* 109*  BUN 15 13  CREATININE 0.70 0.62  CALCIUM 8.4* 8.6*  AST 17  --   ALT 9  --   ALKPHOS 52  --   BILITOT 0.8  --    Cardiac Enzymes No results for input(s): TROPONINI in the last 168 hours. RADIOLOGY:  DG Chest 2 View  Result Date: 09/06/2020 CLINICAL DATA:  Shortness of breath. EXAM: CHEST - 2 VIEW  COMPARISON:  09/25/2017 FINDINGS: The cardiomediastinal contours are normal. Aortic atherosclerosis. Pulmonary vasculature is normal. No consolidation, pleural effusion, or pneumothorax. No acute osseous abnormalities are seen. Mild degenerative change of both shoulders. IMPRESSION: No acute chest findings. Aortic Atherosclerosis (ICD10-I70.0). Electronically Signed   By: 09/27/2017 M.D.   On: 09/06/2020 23:16   DG Abdomen 1 View  Result Date: 09/06/2020 CLINICAL DATA:  Abdominal pain.  No bowel movement for 2 days. EXAM: ABDOMEN - 1 VIEW COMPARISON:  None. FINDINGS: Single supine view of the abdomen. There is increased air throughout nondilated small bowel in the central abdomen. Air-filled colon with only minimal formed stool in the right colon. No abnormal rectal distention. Surgical clips in the pelvis. There are vascular calcifications. Possible punctate nonobstructing left renal stone. No acute osseous abnormalities are seen. IMPRESSION: 1. Increased air throughout nondilated small bowel in the central abdomen, may represent ileus or enteritis. No evidence of obstruction. 2. Air-filled colon with only minimal formed stool. Electronically Signed   By: 11/06/2020 M.D.   On: 09/06/2020 23:18   CT Angio Chest PE W and/or Wo Contrast  Result Date: 09/07/2020 CLINICAL DATA:  Dyspnea, wheezing, unspecified abdominal pain, constipation EXAM: CT ANGIOGRAPHY CHEST CT ABDOMEN AND PELVIS WITH CONTRAST TECHNIQUE: Multidetector CT imaging of the chest was performed using the standard protocol during bolus administration of intravenous contrast. Multiplanar CT  image reconstructions and MIPs were obtained to evaluate the vascular anatomy. Multidetector CT imaging of the abdomen and pelvis was performed using the standard protocol during bolus administration of intravenous contrast. CONTRAST:  100mL OMNIPAQUE IOHEXOL 350 MG/ML SOLN COMPARISON:  CTA 01/26/2017 FINDINGS: CTA CHEST FINDINGS Cardiovascular:  There is adequate opacification of the pulmonary arterial tree through the segmental level. There is no intraluminal filling defect identified to suggest acute pulmonary embolism. The central pulmonary arteries are enlarged in keeping with changes of pulmonary arterial hypertension, similar to that noted on prior examination. Extensive multi-vessel coronary artery calcification. Global cardiac size is within normal limits. Mild left ventricular hypertrophy is noted. No pericardial effusion. Atherosclerotic calcification is seen within the thoracic aorta. No aortic aneurysm. Mediastinum/Nodes: Visualized thyroid is unremarkable. No pathologic thoracic adenopathy. The esophagus is unremarkable. Small hiatal hernia. Lungs/Pleura: Evaluation of the pulmonary parenchyma is limited by respiratory motion artifact. There is, however, scattered areas of peribronchial infiltrate within the posterior segment of the right upper lobe and right lower lobe as well as centrally within the left lower lobe to a lesser extent most in keeping with atypical infection in the acute setting. There is mild associated bronchial wall thickening noted within the segments in keeping with airway inflammation. There is mosaic attenuation of the pulmonary parenchyma in keeping with areas of air trapping, asymmetrically affecting the affected regions of the lung mentioned above. No pneumothorax or pleural effusion. No central obstructing lesion. Musculoskeletal: No lytic or blastic bone lesions are identified. No acute bone abnormality. Review of the MIP images confirms the above findings. CT ABDOMEN and PELVIS FINDINGS Hepatobiliary: Liver unremarkable. Gallbladder absent. Mild extrahepatic biliary ductal dilation is nonspecific but is stable since prior examination and likely simply represents post cholecystectomy change. Pancreas: Unremarkable Spleen: Unremarkable Adrenals/Urinary Tract: The adrenal glands are unremarkable. The kidneys are  normal in position. There is asymmetric cortical scarring involving the left kidney. Extrarenal pelvis noted involving the left kidney. The kidneys are otherwise unremarkable. There is circumferential bladder wall thickening and moderate perivesicular inflammatory stranding in keeping with a diffuse infectious or inflammatory cystitis. The bladder is not distended. Stomach/Bowel: The stomach, small bowel, and large bowel are unremarkable. Appendix absent. No free intraperitoneal gas or fluid. Vascular/Lymphatic: Moderate aortoiliac atherosclerotic calcification. Particularly prominent atherosclerotic calcification noted at the origin of the renal arteries bilaterally. No aortic aneurysm. No pathologic adenopathy within the abdomen and pelvis. Reproductive: Status post hysterectomy. No adnexal masses. Numerous surgical clips are seen within the pelvis bilaterally in keeping with probable lymph node dissection. Other: No abdominal wall hernia.  Rectum unremarkable. Musculoskeletal: Osseous structures are age-appropriate. No acute bone abnormality. Review of the MIP images confirms the above findings. IMPRESSION: No pulmonary embolism. Morphologic changes in keeping with pulmonary arterial hypertension, unchanged. Extensive multi-vessel coronary artery calcification. Multifocal pulmonary infiltrates in keeping with changes of atypical infection in the appropriate clinical setting. No central obstructing lesion. Moderate circumferential bladder wall thickening and extensive surrounding inflammatory stranding in keeping with diffuse infectious or inflammatory cystitis. Correlation with urinalysis and urine culture may be helpful. Peripheral vascular disease with extensive atherosclerotic calcification at the renal ostia bilaterally. The degree of stenosis is not well assessed on this non arteriographic study. Aortic Atherosclerosis (ICD10-I70.0). Electronically Signed   By: Helyn NumbersAshesh  Parikh MD   On: 09/07/2020 03:07   CT  ABDOMEN PELVIS W CONTRAST  Result Date: 09/07/2020 CLINICAL DATA:  Dyspnea, wheezing, unspecified abdominal pain, constipation EXAM: CT ANGIOGRAPHY CHEST CT ABDOMEN AND PELVIS WITH CONTRAST TECHNIQUE: Multidetector  CT imaging of the chest was performed using the standard protocol during bolus administration of intravenous contrast. Multiplanar CT image reconstructions and MIPs were obtained to evaluate the vascular anatomy. Multidetector CT imaging of the abdomen and pelvis was performed using the standard protocol during bolus administration of intravenous contrast. CONTRAST:  OMNIPAQUE IOHEXOL 350 MG/ML SOLN COMPARISON:  CTA 01/26/2017 FINDINGS: CTA CHEST FINDINGS Cardiovascular: There is adequate opacification of the pulmonary arterial tree through the segmental level. There is no intraluminal filling defect identified to suggest acute pulmonary embolism. The central pulmonary arteries are enlarged in keeping with changes of pulmonary arterial hypertension, similar to that noted on prior examination. Extensive multi-vessel coronary artery calcification. Global cardiac size is within normal limits. Mild left ventricular hypertrophy is noted. No pericardial effusion. Atherosclerotic calcification is seen within the thoracic aorta. No aortic aneurysm. Mediastinum/Nodes: Visualized thyroid is unremarkable. No pathologic thoracic adenopathy. The esophagus is unremarkable. Small hiatal hernia. Lungs/Pleura: Evaluation of the pulmonary parenchyma is limited by respiratory motion artifact. There is, however, scattered areas of peribronchial infiltrate within the posterior segment of the right upper lobe and right lower lobe as well as centrally within the left lower lobe to a lesser extent most in keeping with atypical infection in the acute setting. There is mild associated bronchial wall thickening noted within the segments in keeping with airway inflammation. There is mosaic attenuation of the pulmonary  parenchyma in keeping with areas of air trapping, asymmetrically affecting the affected regions of the lung mentioned above. No pneumothorax or pleural effusion. No central obstructing lesion. Musculoskeletal: No lytic or blastic bone lesions are identified. No acute bone abnormality. Review of the MIP images confirms the above findings. CT ABDOMEN and PELVIS FINDINGS Hepatobiliary: Liver unremarkable. Gallbladder absent. Mild extrahepatic biliary ductal dilation is nonspecific but is stable since prior examination and likely simply represents post cholecystectomy change. Pancreas: Unremarkable Spleen: Unremarkable Adrenals/Urinary Tract: The adrenal glands are unremarkable. The kidneys are normal in position. There is asymmetric cortical scarring involving the left kidney. Extrarenal pelvis noted involving the left kidney. The kidneys are otherwise unremarkable. There is circumferential bladder wall thickening and moderate perivesicular inflammatory stranding in keeping with a diffuse infectious or inflammatory cystitis. The bladder is not distended. Stomach/Bowel: The stomach, small bowel, and large bowel are unremarkable. Appendix absent. No free intraperitoneal gas or fluid. Vascular/Lymphatic: Moderate aortoiliac atherosclerotic calcification. Particularly prominent atherosclerotic calcification noted at the origin of the renal arteries bilaterally. No aortic aneurysm. No pathologic adenopathy within the abdomen and pelvis. Reproductive: Status post hysterectomy. No adnexal masses. Numerous surgical clips are seen within the pelvis bilaterally in keeping with probable lymph node dissection. Other: No abdominal wall hernia.  Rectum unremarkable. Musculoskeletal: Osseous structures are age-appropriate. No acute bone abnormality. Review of the MIP images confirms the above findings. IMPRESSION: No pulmonary embolism. Morphologic changes in keeping with pulmonary arterial hypertension, unchanged. Extensive  multi-vessel coronary artery calcification. Multifocal pulmonary infiltrates in keeping with changes of atypical infection in the appropriate clinical setting. No central obstructing lesion. Moderate circumferential bladder wall thickening and extensive surrounding inflammatory stranding in keeping with diffuse infectious or inflammatory cystitis. Correlation with urinalysis and urine culture may be helpful. Peripheral vascular disease with extensive atherosclerotic calcification at the renal ostia bilaterally. The degree of stenosis is not well assessed on this non arteriographic study. Aortic Atherosclerosis (ICD10-I70.0). Electronically Signed   By: Helyn Numbers MD   On: 09/07/2020 03:07   DG Abd 2 Views  Result Date: 09/07/2020 CLINICAL DATA:  87 year old  female with abdominal pain, shortness of breath. Evidence of urinary bladder cystitis on CT. EXAM: ABDOMEN - 2 VIEW COMPARISON:  CT Chest, Abdomen, and Pelvis earlier today. KUB 09/06/2020. FINDINGS: Upright and supine views of the abdomen and pelvis. Excreted IV contrast in the renal collecting systems and bladder. Asymmetric appearance of the left renal pelvis similar to the CT earlier today where there is questionable urothelial thickening. No overt hydronephrosis. No pneumoperitoneum. Non obstructed bowel gas pattern. Stable lower chest. Stable visualized osseous structures. Bilateral pelvic sidewall surgical clips again noted. IMPRESSION: 1. Excreted IV contrast in the renal collecting systems and bladder, with asymmetry of the left renal pelvis and question of infectious or inflammatory urothelial thickening there when correlated to the CT earlier today. 2.  Normal bowel gas pattern, no free air. Electronically Signed   By: Odessa Fleming M.D.   On: 09/07/2020 07:45   ASSESSMENT AND PLAN:  Alva Kuenzel is a 87 y.o. female with history of CAD, CVA, CHF who presents to the emergency department from her nursing facility for concerns of shortness of breath.  Sats were 90%.  Does not wear oxygen.  No known history of asthma or COPD.  Does have documented history of CHF.  Sepsis POA Suspected due to Acute interstitial cystitis (CT abd and abnormal urine) and clincally PNA ruled out --sepsis reovled --fever 101.4, RR >20, abnormal UA and CT abd showing thickened bladder wall /cyctitis -IV Rocephin 2 g qd -- Pro calcitonin negative less likely pneumonia given no symptoms of cough or shortness of breath. -- sats more than 92% on room air. No respiratory distress -- blood culture negative -- follow-up urine culture >100K GNR   Possible acute on chronic systolic CHF. - gently diuresed with IV Lasix--now change to po lasix. Appears Euvolemic. --BNP 525 -cont bb, lisinopril -Her last full echo was on 01/27/2017 revealing an EF of 30 to 35% with moderate right regurgitation and mild left atrial dilatation.  Hypertensive urgency--improved -- Blood pressure has been as high  This certainly could be contributing to her acute CHF. -- continue her  Home antihypertensives and add as needed IV lhydralazine  Coronary artery disease. - c beta-blocker,statin  And as needed sublingual nitroglycerin.   Dyslipidemia. -Statins and Lovaza.   GERD. -continue PPI therapy.   Depression  - continue fluoxetine.  H/o CVA with aphasia and right hemiparesis --on asa  DVT prophylaxis: Lovenox. Code Status: patient is DNR/DNI   Procedures: Family communication :son Jamse Mead on the phone Consults :none CODE STATUS: DNR/DNI DVT Prophylaxis : Level of care: Progressive Cardiac Status is: Inpatient  Remains inpatient appropriate because:Inpatient level of care appropriate due to severity of illness   Dispo: The patient is from: [facility]              Anticipated d/c is to: LTC on Monday 3/14              Patient currently is medically best optimized for d/c to her LTC facility   Difficult to place patient No        TOTAL TIME TAKING  CARE OF THIS PATIENT: 25 minutes.  >50% time spent on counselling and coordination of care  Note: This dictation was prepared with Dragon dictation along with smaller phrase technology. Any transcriptional errors that result from this process are unintentional.  Enedina Finner M.D    [facility]   CC: Primary care physician; Derwood Kaplan, MDPatient ID: Donnamarie Poag, female   DOB: 11/13/1928, 87  y.o.   MRN: [id number]

## 2020-09-08 NOTE — Plan of Care (Signed)
  Problem: Education: Goal: Knowledge of General Education information will improve Description: Including pain rating scale, medication(s)/side effects and non-pharmacologic comfort measures Outcome: Progressing   Problem: Health Behavior/Discharge Planning: Goal: Ability to manage health-related needs will improve Outcome: Progressing   Problem: Clinical Measurements: Goal: Ability to maintain clinical measurements within normal limits will improve Outcome: Progressing Goal: Will remain free from infection Outcome: Progressing Goal: Diagnostic test results will improve Outcome: Progressing Goal: Respiratory complications will improve Outcome: Progressing Goal: Cardiovascular complication will be avoided Outcome: Progressing   Problem: Activity: Goal: Risk for activity intolerance will decrease Outcome: Progressing   Problem: Nutrition: Goal: Adequate nutrition will be maintained Outcome: Progressing   Problem: Coping: Goal: Level of anxiety will decrease Outcome: Progressing   Problem: Elimination: Goal: Will not experience complications related to bowel motility Outcome: Progressing Goal: Will not experience complications related to urinary retention Outcome: Progressing   Problem: Pain Managment: Goal: General experience of comfort will improve Outcome: Progressing   Problem: Safety: Goal: Ability to remain free from injury will improve Outcome: Progressing   Problem: Skin Integrity: Goal: Risk for impaired skin integrity will decrease Outcome: Progressing   Problem: Urinary Elimination: Goal: Signs and symptoms of infection will decrease Outcome: Progressing   Problem: Activity: Goal: Ability to tolerate increased activity will improve Outcome: Progressing   Problem: Clinical Measurements: Goal: Ability to maintain a body temperature in the normal range will improve Outcome: Progressing   Problem: Respiratory: Goal: Ability to maintain adequate  ventilation will improve Outcome: Progressing Goal: Ability to maintain a clear airway will improve Outcome: Progressing   Pt is AAOx1. Self only. Mittens on to prevent from pulling. Frequent skin check on wrist was done. Pt took all meds crushed with apple sauce. Bed alarm is on. VSS. All needs attended. Call light is within reach. Safety measures maintained. Will continue to monitor.

## 2020-09-09 LAB — ECHOCARDIOGRAM COMPLETE
AR max vel: 2.19 cm2
AV Peak grad: 3 mmHg
Ao pk vel: 0.87 m/s
Area-P 1/2: 5.02 cm2
Height: 62 in
S' Lateral: 2.54 cm
Weight: 2419.77 oz

## 2020-09-09 LAB — URINE CULTURE: Culture: >=100000 — AB

## 2020-09-09 LAB — LEGIONELLA PNEUMOPHILA SEROGP 1 UR AG: L. pneumophila Serogp 1 Ur Ag: NEGATIVE

## 2020-09-09 MED ORDER — FUROSEMIDE 20 MG PO TABS: 20.0000 mg | ORAL_TABLET | ORAL | Status: DC

## 2020-09-09 MED ORDER — CEPHALEXIN 500 MG PO CAPS
500.0000 mg | ORAL_CAPSULE | Freq: Three times a day (TID) | ORAL | Status: DC
  Administered 2020-09-09 – 2020-09-10 (×3): 500 mg via ORAL
  Filled 2020-09-09 (×2): qty 1

## 2020-09-09 MED ORDER — CEPHALEXIN 500 MG PO CAPS: 500.0000 mg | ORAL_CAPSULE | Freq: Three times a day (TID) | ORAL | Status: DC

## 2020-09-09 NOTE — Plan of Care (Signed)
  Problem: Education: Goal: Knowledge of General Education information will improve Description: Including pain rating scale, medication(s)/side effects and non-pharmacologic comfort measures Outcome: Progressing   Problem: Health Behavior/Discharge Planning: Goal: Ability to manage health-related needs will improve Outcome: Progressing   Problem: Clinical Measurements: Goal: Ability to maintain clinical measurements within normal limits will improve Outcome: Progressing Goal: Will remain free from infection Outcome: Progressing Goal: Diagnostic test results will improve Outcome: Progressing Goal: Respiratory complications will improve Outcome: Progressing Goal: Cardiovascular complication will be avoided Outcome: Progressing   Problem: Activity: Goal: Risk for activity intolerance will decrease Outcome: Progressing   Problem: Nutrition: Goal: Adequate nutrition will be maintained Outcome: Progressing   Problem: Coping: Goal: Level of anxiety will decrease Outcome: Progressing   Problem: Elimination: Goal: Will not experience complications related to bowel motility Outcome: Progressing Goal: Will not experience complications related to urinary retention Outcome: Progressing   Problem: Pain Managment: Goal: General experience of comfort will improve Outcome: Progressing   Problem: Safety: Goal: Ability to remain free from injury will improve Outcome: Progressing   Problem: Skin Integrity: Goal: Risk for impaired skin integrity will decrease Outcome: Progressing   Problem: Urinary Elimination: Goal: Signs and symptoms of infection will decrease Outcome: Progressing   Problem: Activity: Goal: Ability to tolerate increased activity will improve Outcome: Progressing   Problem: Clinical Measurements: Goal: Ability to maintain a body temperature in the normal range will improve Outcome: Progressing   Problem: Respiratory: Goal: Ability to maintain adequate  ventilation will improve Outcome: Progressing Goal: Ability to maintain a clear airway will improve Outcome: Progressing   

## 2020-09-09 NOTE — Progress Notes (Signed)
[facility]  - [place] at [facility]   PATIENT NAME: Alexandria Hanson    MR#:  [id number]  DATE OF BIRTH:  [birth year]  SUBJECTIVE:  patient has chronic dysarthria from previous stroke. I can understand few words she talks. She understands most of the conversation that I have with her. Per RN does not eat much. No respiratory distress no fever REVIEW OF SYSTEMS:   Review of Systems  Unable to perform ROS: Medical condition  chronic dysarthria--limited converstaiotn Tolerating Diet:yes Tolerating PT: bedbound  DRUG ALLERGIES:  No Known Allergies  VITALS:  Blood pressure (!) 134/55, pulse 62, temperature 98 F (36.7 C), temperature source Oral, resp. rate 20, height 5\' 2"  (1.575 m), weight 57.3 kg, SpO2 95 %.  PHYSICAL EXAMINATION:   Physical Exam limited GENERAL:  87 y.o.-year-old patient lying in the bed with no acute distress.  LUNGS: Normal breath sounds bilaterally, no wheezing, rales, rhonchi. No use of accessory muscles of respiration.  CARDIOVASCULAR: S1, S2 normal. No murmurs, rubs, or gallops.  ABDOMEN: Soft, nontender, nondistended. Bowel sounds present. EXTREMITIES: No cyanosis, clubbing or edema b/l.    NEUROLOGIC: chronic weakness on right with hemiparesis with contractures and upper extremity.chronic dysarthria PSYCHIATRIC:  patient is alert and awake, pleasantly confused SKIN: No obvious rash, lesion, or ulcer.   LABORATORY PANEL:  CBC Recent Labs  Lab 09/07/20 0501  WBC 8.5  HGB 10.8*  HCT 33.1*  PLT 140*    Chemistries  Recent Labs  Lab 09/06/20 2232 09/07/20 0501  NA 137 138  K 4.0 3.5  CL 107 107  CO2 23 24  GLUCOSE 116* 109*  BUN 15 13  CREATININE 0.70 0.62  CALCIUM 8.4* 8.6*  AST 17  --   ALT 9  --   ALKPHOS 52  --   BILITOT 0.8  --    Cardiac Enzymes No results for input(s): TROPONINI in the last 168 hours. RADIOLOGY:  ECHOCARDIOGRAM COMPLETE  Result Date: 09/09/2020    ECHOCARDIOGRAM REPORT   Patient Name:   DONYEL Hanson Date of Exam: 09/08/2020 Medical Rec #:  11/08/2020    Height:       62.0 in Accession #:    [id number]   Weight:       151.2 lb Date of Birth:  20-Aug-1928    BSA:          1.698 m Patient Age:    89 years     BP:           181/99 mmHg Patient Gender: F            HR:           81 bpm. Exam Location:  [facility] Procedure: 2D Echo and Strain Analysis Indications:     CHF I50.21  History:         Patient has prior history of Echocardiogram examinations, most                  recent 01/27/2017.  Sonographer:     01/29/2017 RDCS Referring Phys:  Wonda Cerise [id number] MANSY Diagnosing Phys: Vernetta Honey MD IMPRESSIONS  1. Left ventricular ejection fraction, by estimation, is 60 to 65%. The left ventricle has normal function. The left ventricle has no regional wall motion abnormalities. There is moderate left ventricular hypertrophy. Left ventricular diastolic parameters were normal.  2. Right ventricular systolic function is normal. The right ventricular size is normal.  3. The mitral valve is grossly normal. Trivial mitral  valve regurgitation.  4. The aortic valve was not well visualized. Aortic valve regurgitation is trivial. FINDINGS  Left Ventricle: Left ventricular ejection fraction, by estimation, is 60 to 65%. The left ventricle has normal function. The left ventricle has no regional wall motion abnormalities. The left ventricular internal cavity size was normal in size. There is  moderate left ventricular hypertrophy. Left ventricular diastolic parameters were normal. Right Ventricle: The right ventricular size is normal. No increase in right ventricular wall thickness. Right ventricular systolic function is normal. Left Atrium: Left atrial size was normal in size. Right Atrium: Right atrial size was normal in size. Pericardium: There is no evidence of pericardial effusion. Mitral Valve: The mitral valve is grossly normal. Trivial mitral valve regurgitation. Tricuspid Valve: The tricuspid valve is not well  visualized. Tricuspid valve regurgitation is mild. Aortic Valve: The aortic valve was not well visualized. Aortic valve regurgitation is trivial. Aortic valve peak gradient measures 3.0 mmHg. Pulmonic Valve: The pulmonic valve was not well visualized. Pulmonic valve regurgitation is not visualized. Aorta: The aortic root is normal in size and structure. IAS/Shunts: The interatrial septum was not well visualized.  LEFT VENTRICLE PLAX 2D LVIDd:         3.81 cm  Diastology LVIDs:         2.54 cm  LV e' medial:    5.44 cm/s LV PW:         1.45 cm  LV E/e' medial:  12.8 LV IVS:        1.54 cm  LV e' lateral:   3.48 cm/s LVOT diam:     1.90 cm  LV E/e' lateral: 19.9 LV SV:         30 LV SV Index:   18 LVOT Area:     2.84 cm  RIGHT VENTRICLE RV Basal diam:  1.98 cm RV S prime:     14.80 cm/s TAPSE (M-mode): 1.7 cm LEFT ATRIUM           Index       RIGHT ATRIUM          Index LA diam:      2.70 cm 1.59 cm/m  RA Area:     6.61 cm LA Vol (A2C): 18.4 ml 10.84 ml/m RA Volume:   11.00 ml 6.48 ml/m LA Vol (A4C): 43.5 ml 25.62 ml/m  AORTIC VALVE AV Area (Vmax): 2.19 cm AV Vmax:        86.90 cm/s AV Peak Grad:   3.0 mmHg LVOT Vmax:      67.20 cm/s LVOT Vmean:     45.300 cm/s LVOT VTI:       0.105 m  AORTA Ao Root diam: 3.00 cm Ao Asc diam:  3.00 cm MITRAL VALVE MV Area (PHT): 5.02 cm    SHUNTS MV Decel Time: 151 msec    Systemic VTI:  0.10 m MV E velocity: 69.40 cm/s  Systemic Diam: 1.90 cm MV A velocity: 35.10 cm/s MV E/A ratio:  1.98 Harold Hedge MD Electronically signed by Harold Hedge MD Signature Date/Time: 09/09/2020/8:18:39 AM    Final    ASSESSMENT AND PLAN:  Alexandria Hanson is a 87 y.o. female with history of CAD, CVA, CHF who presents to the emergency department from her nursing facility for concerns of shortness of breath. Sats were 90%.  Does not wear oxygen.  No known history of asthma or COPD.  Does have documented history of CHF.  Sepsis POA Suspected due to Acute interstitial cystitis (CT  abd and abnormal  urine) and clincally PNA ruled out --sepsis reovled --fever 101.4, RR >20, abnormal UA and CT abd showing thickened bladder wall /cyctitis -IV Rocephin 2 g qd--change to po keflex tid x 5 days -- Pro calcitonin negative less likely pneumonia given no symptoms of cough or shortness of breath. -- sats more than 92% on room air. No respiratory distress -- blood culture negative -- follow-up urine culture >100K GNR>> E coli  Acute on chronic systolic CHF. resolved - gently diuresed with IV Lasix--now change to po lasix qod.  --Appears Euvolemic. --BNP 525 -cont bb, lisinopril -Her last full echo was on 01/27/2017 revealing an EF of 30 to 35% with moderate right regurgitation and mild left atrial dilatation.  Hypertensive urgency--improved -- Blood pressure has been as high  This certainly could be contributing to her acute CHF. -- continue her  Home antihypertensives and add as needed IV lhydralazine  Coronary artery disease. -  beta-blocker,statin  And as needed sublingual nitroglycerin.   Dyslipidemia. -Statins and Lovaza.   GERD. -continue PPI therapy.   Depression  - continue fluoxetine.  H/o CVA with aphasia and right hemiparesis --on asa  DVT prophylaxis: Lovenox. Code Status: patient is DNR/DNI   Procedures: Family communication :dter Harriett Sine on the phone  Consults :none CODE STATUS: DNR/DNI DVT Prophylaxis : Level of care: Progressive Cardiac Status is: Inpatient  Remains inpatient appropriate because:Inpatient level of care appropriate due to severity of illness   Dispo: The patient is from: [facility]              Anticipated d/c is to: LTC on Monday 3/14              Patient currently is medically best optimized for d/c to her LTC facility   Difficult to place patient No        TOTAL TIME TAKING CARE OF THIS PATIENT: 25 minutes.  >50% time spent on counselling and coordination of care  Note: This dictation was prepared with Dragon  dictation along with smaller phrase technology. Any transcriptional errors that result from this process are unintentional.  Enedina Finner M.D    [facility]   CC: Primary care physician; Derwood Kaplan, MDPatient ID: Donnamarie Poag, female   DOB: Feb 14, 1929, 87 y.o.   MRN: [id number]

## 2020-09-10 LAB — RESP PANEL BY RT-PCR (FLU A&B, COVID) ARPGX2
Influenza A by PCR: NEGATIVE
Influenza B by PCR: NEGATIVE
SARS Coronavirus 2 by RT PCR: NEGATIVE

## 2020-09-10 MED ORDER — METOPROLOL TARTRATE 50 MG PO TABS: 50.0000 mg | ORAL_TABLET | Freq: Two times a day (BID) | ORAL | Status: AC

## 2020-09-10 MED ORDER — ATORVASTATIN CALCIUM 40 MG PO TABS: 40.0000 mg | ORAL_TABLET | Freq: Every day | ORAL | Status: AC

## 2020-09-10 MED ORDER — CEPHALEXIN 500 MG PO CAPS: 500.0000 mg | ORAL_CAPSULE | Freq: Two times a day (BID) | ORAL | 0 refills | Status: AC

## 2020-09-10 NOTE — Care Management Important Message (Signed)
Important Message  Patient Details  Name: Alexandria Hanson MRN: [id number] Date of Birth: [birth year]   Medicare Important Message Given:  Yes  Reviewed with daughter, Alvan Dame, at [phone].  Copy of Medicare IM mailed to daughter's attention at address provided: [address] [place], [place] [address].   Johnell Comings 09/10/2020, 11:58 AM

## 2020-09-10 NOTE — Discharge Summary (Addendum)
5        [place] at [facility]   PATIENT NAME: Alexandria Hanson    MR#:  [id number]  DATE OF BIRTH:  Mar 07, 1929  DATE OF ADMISSION:  09/07/2020   ADMITTING PHYSICIAN: Hannah Beat, MD  DATE OF DISCHARGE: 09/10/2020  PRIMARY CARE PHYSICIAN: Derwood Kaplan, MD   ADMISSION DIAGNOSIS:  Ileus (HCC) [K56.7] Acute UTI [N39.0] DISCHARGE DIAGNOSIS:  Active Problems:   Multifocal pneumonia   Sepsis without acute organ dysfunction (HCC)   Acute cystitis without hematuria   Acute on chronic systolic CHF (congestive heart failure) (HCC)   Speech disturbance  SECONDARY DIAGNOSIS:   Past Medical History:  Diagnosis Date  . Anxiety   . Bleeding in brain [facility])    s/p fall  . CAD (coronary artery disease)    s/p MI 30 yrs ago  . Cerebral artery occlusion   . Depression   . Hypertension   . MI (mitral incompetence)   . Stroke (HCC) 2001  . Stroke [facility])    HOSPITAL COURSE:  Alexandria Hanson a 87 y.o.femalewith history of CAD, CVA, CHFwho presents to the emergency department from her nursing facility for concerns of shortness of breath.Sats were 90%  Sepsis POA -now resolved with treatment Suspected due to Acute interstitial cystitis (CT abd and abnormal urine) and clincally PNA ruled out --Due to E. coli UTI.  Finish 5 days course of oral Keflex.  Acute on chronic systolic CHF -   POA and now well compensated with gentle diuresis. -Her last full echo was on 01/27/2017 revealing an EF of 30 to 35% with moderate right regurgitation and mild left atrial dilatation.  Hypertensive urgency--resolved --Resume home blood pressure medicine  Coronary artery disease. -  beta-blocker,statin  And as needed sublingual nitroglycerin.  Dyslipidemia. -Lovaza.  GERD. -continue PPI therapy.  Depression  - continue fluoxetine.  H/o CVA with aphasia and right hemiparesis --on asa    DISCHARGE CONDITIONS:  Stable CONSULTS OBTAINED:   DRUG ALLERGIES:  No Known  Allergies DISCHARGE MEDICATIONS:   Allergies as of 09/10/2020   No Known Allergies     Medication List    STOP taking these medications   dicyclomine 10 MG/5ML solution Commonly known as: BENTYL     TAKE these medications   acetaminophen 325 MG tablet Commonly known as: TYLENOL Take 650 mg by mouth every 4 (four) hours as needed for mild pain or moderate pain.   atorvastatin 40 MG tablet Commonly known as: LIPITOR Take 1 tablet (40 mg total) by mouth daily at 6 PM.   cephALEXin 500 MG capsule Commonly known as: KEFLEX Take 1 capsule (500 mg total) by mouth 2 (two) times daily for 4 days.   clopidogrel 75 MG tablet Commonly known as: PLAVIX Take 1 tablet (75 mg total) by mouth daily.   FLUoxetine 20 MG capsule Commonly known as: PROZAC Take 20 mg by mouth daily.   furosemide 20 MG tablet Commonly known as: LASIX Take 20 mg by mouth daily.   guaifenesin 100 MG/5ML syrup Commonly known as: ROBITUSSIN Take 100 mg by mouth every 4 (four) hours as needed for cough.   isosorbide mononitrate 30 MG 24 hr tablet Commonly known as: IMDUR Take 30 mg by mouth daily.   lidocaine 2 % solution Commonly known as: XYLOCAINE Use as directed 15 mLs in the mouth or throat every 4 (four) hours as needed.   lisinopril 5 MG tablet Commonly known as: ZESTRIL Take 5 mg by mouth daily.  metoprolol tartrate 50 MG tablet Commonly known as: LOPRESSOR Take 1 tablet (50 mg total) by mouth 2 (two) times daily. What changed:  medication strength how much to take   multivitamin tablet Take 1 tablet by mouth daily.   nitroGLYCERIN 0.4 MG/SPRAY spray Commonly known as: NITROLINGUAL Place 1 spray under the tongue every 5 (five) minutes x 3 doses as needed for chest pain.   omega-3 acid ethyl esters 1 g capsule Commonly known as: LOVAZA Take 1 g by mouth 3 (three) times daily.   pantoprazole 40 MG tablet Commonly known as: PROTONIX Take 40 mg by mouth daily.      DISCHARGE  INSTRUCTIONS:   DIET:  Dysphagia 2 (Fine chop);Dysphagia 3 (Mech soft)   Liquid Administration via: Cup;Straw Medication Administration: Crushed with puree Supervision: Staff to assist with self feeding Compensations: Slow rate;Small sips/bites Postural Changes: Seated upright at 90 degrees;Remain upright for at least 30 minutes after po intake  DISCHARGE CONDITION:  Fair ACTIVITY:  Activity as tolerated OXYGEN:  Home Oxygen: No.  Oxygen Delivery: room air DISCHARGE LOCATION:  [facility]-term care with palliative care to follow.  Consider transition to hospice if and when she qualifies and appropriate  If you experience worsening of your admission symptoms, develop shortness of breath, life threatening emergency, suicidal or homicidal thoughts you must seek medical attention immediately by calling 911 or calling your MD immediately  if symptoms less severe.  You Must read complete instructions/literature along with all the possible adverse reactions/side effects for all the Medicines you take and that have been prescribed to you. Take any new Medicines after you have completely understood and accpet all the possible adverse reactions/side effects.   Please note  You were cared for by a hospitalist during your hospital stay. If you have any questions about your discharge medications or the care you received while you were in the hospital after you are discharged, you can call the unit and asked to speak with the hospitalist on call if the hospitalist that took care of you is not available. Once you are discharged, your primary care physician will handle any further medical issues. Please note that NO REFILLS for any discharge medications will be authorized once you are discharged, as it is imperative that you return to your primary care physician (or establish a relationship with a primary care physician if you do not have one) for your aftercare needs so that they can reassess  your need for medications and monitor your lab values.    On the day of Discharge:  VITAL SIGNS:  Blood pressure (!) 148/65, pulse 68, temperature 98.3 F (36.8 C), temperature source Oral, resp. rate 14, height 5\' 2"  (1.575 m), weight 57.4 kg, SpO2 98 %. PHYSICAL EXAMINATION:  GENERAL:  87 y.o.-year-old patient lying in the bed with no acute distress.  EYES: Pupils equal, round, reactive to light and accommodation. No scleral icterus. Extraocular muscles intact.  HEENT: Head atraumatic, normocephalic. Oropharynx and nasopharynx clear.  NECK:  Supple, no jugular venous distention. No thyroid enlargement, no tenderness.  LUNGS: Normal breath sounds bilaterally, no wheezing, rales,rhonchi or crepitation. No use of accessory muscles of respiration.  CARDIOVASCULAR: S1, S2 normal. No murmurs, rubs, or gallops.  ABDOMEN: Soft, non-tender, non-distended. Bowel sounds present. No organomegaly or mass.  EXTREMITIES: Chronic right sided contracture with hemiparesis and upper extremity contractures.  NEUROLOGIC: Alert and awake.  Chronic dysarthria PSYCHIATRIC: The patient is alert and oriented x 3.  SKIN: No obvious rash, lesion, or  ulcer.  DATA REVIEW:   CBC Recent Labs  Lab 09/07/20 0501  WBC 8.5  HGB 10.8*  HCT 33.1*  PLT 140*    Chemistries  Recent Labs  Lab 09/06/20 2232 09/07/20 0501  NA 137 138  K 4.0 3.5  CL 107 107  CO2 23 24  GLUCOSE 116* 109*  BUN 15 13  CREATININE 0.70 0.62  CALCIUM 8.4* 8.6*  AST 17  --   ALT 9  --   ALKPHOS 52  --   BILITOT 0.8  --      Outpatient follow-up  Follow-up Information    Derwood Kaplan, MD. Schedule an appointment as soon as possible for a visit in 1 week(s).   Specialty: Internal Medicine Contact information: [address] [place] [address] [phone]               30 Day Unplanned Readmission Risk Score   Flowsheet Row ED to Hosp-Admission (Current) from 09/07/2020 in [facility]   30 Day Unplanned Readmission Risk Score (%) 11.72 Filed at 09/10/2020 1200     This score is the patient's risk of an unplanned readmission within 30 days of being discharged (0 -100%). The score is based on dignosis, age, lab data, medications, orders, and past utilization.   Low:  0-14.9   Medium: 15-21.9   High: 22-29.9   Extreme: 30 and above         Management plans discussed with the patient, family and they are in agreement.  CODE STATUS: DNR   TOTAL TIME TAKING CARE OF THIS PATIENT: 45 minutes.    Delfino Lovett M.D on 09/10/2020 at 2:42 PM  [facility]   CC: Primary care physician; Derwood Kaplan, MD   Note: This dictation was prepared with Dragon dictation along with smaller phrase technology. Any transcriptional errors that result from this process are unintentional.

## 2020-09-10 NOTE — Evaluation (Signed)
Clinical/Bedside Swallow Evaluation Patient Details  Name: Alexandria Hanson MRN: [id number] Date of Birth: February 10, 1929  Today's Date: 09/10/2020 Time: SLP Start Time (ACUTE ONLY): 1215 SLP Stop Time (ACUTE ONLY): 1300 SLP Time Calculation (min) (ACUTE ONLY): 45 min  Past Medical History:  Past Medical History:  Diagnosis Date  . Anxiety   . Bleeding in brain [facility])    s/p fall  . CAD (coronary artery disease)    s/p MI 30 yrs ago  . Cerebral artery occlusion   . Depression   . Hypertension   . MI (mitral incompetence)   . Stroke (HCC) 2001  . Stroke [facility])    Past Surgical History:  Past Surgical History:  Procedure Laterality Date  . ABDOMINAL HYSTERECTOMY    . APPENDECTOMY    . CHOLECYSTECTOMY    . COLON SURGERY     knot on colon removed   HPI:  Per admitting H&P "Alexandria Hanson is a 87 y.o. Caucasian female with medical history significant for coronary artery disease, hypertension, CVA in the anxiety, who presented to the ER with acute onset of worsening dyspnea and gasping for air with conversational dyspnea.  Pulse oximetry was 90% on room air.  She is bedbound from previous stroke with residual expressive aphasia.  History was difficult to obtain due to the patient's aphasia.  She had fever here of 101 rectally with mild tachypnea and normal pulse oximetry.  She admitted to dysuria without urinary frequency or urgency.  She has been having 2 pillow orthopnea and paroxysmal nocturnal dyspnea.  She has no worsening lower extremity edema.  No reported nausea or vomiting .  She admits to lower abdominal pain. No reported chest pain or palpitations"   Assessment / Plan / Recommendation Clinical Impression  Pt presents with mild dysphagia but no overt s/s of aspiration during assessment today. Per report, Pt had a significant coughing episode with thin liquids and appetite has been poor since admission. Pt has a history of CVA with residual Dysarthria and aphasia. She was able to follow  directions adequately and make her needs known through verbal communication and head nods. Today, Pt tolerated all consistencies without s/s of aspiration. Vocal quality remained clear throughout assessment. Pt reported she does not know where her lower dentures are. Oral transit delay with solids and mild to moderate residue was present after sold foods. Rec Dys 2 chopped diet for now. Possible discharge today. If dentures are found, can resume regular diet otherwise a mechanical a soft consistency may be needed. Rec meds given in applesauce. If any further s/s of aspiration, please consult ST for reassessment at next discharge location. Prognosis good. SLP Visit Diagnosis: Dysphagia, oropharyngeal phase (R13.12)    Aspiration Risk  Mild aspiration risk    Diet Recommendation Dysphagia 2 (Fine chop);Dysphagia 3 (Mech soft)   Liquid Administration via: Cup;Straw Medication Administration: Crushed with puree Supervision: Staff to assist with self feeding Compensations: Slow rate;Small sips/bites Postural Changes: Seated upright at 90 degrees;Remain upright for at least 30 minutes after po intake    Other  Recommendations     Follow up Recommendations Skilled Nursing facility      Frequency and Duration min 2x/week  1 week       Prognosis Prognosis for Safe Diet Advancement: Good      Swallow Study   General Date of Onset: 09/07/20 HPI: Per admitting H&P "Alexandria Hanson is a 87 y.o. Caucasian female with medical history significant for coronary artery disease, hypertension, CVA in the  anxiety, who presented to the ER with acute onset of worsening dyspnea and gasping for air with conversational dyspnea.  Pulse oximetry was 90% on room air.  She is bedbound from previous stroke with residual expressive aphasia.  History was difficult to obtain due to the patient's aphasia.  She had fever here of 101 rectally with mild tachypnea and normal pulse oximetry.  She admitted to dysuria without  urinary frequency or urgency.  She has been having 2 pillow orthopnea and paroxysmal nocturnal dyspnea.  She has no worsening lower extremity edema.  No reported nausea or vomiting .  She admits to lower abdominal pain. No reported chest pain or palpitations" Type of Study: Bedside Swallow Evaluation Diet Prior to this Study: Regular;Other (Comment) (soft) Temperature Spikes Noted: No Respiratory Status: Room air History of Recent Intubation: No Behavior/Cognition: Alert;Cooperative;Pleasant mood Oral Cavity Assessment: Within Functional Limits Oral Care Completed by SLP: No Oral Cavity - Dentition: Dentures, top;Missing dentition;Other (Comment) (Missing bottom teeth) Vision: Functional for self-feeding Self-Feeding Abilities: Needs set up;Needs assist Patient Positioning: Upright in bed Baseline Vocal Quality: Normal;Other (comment);Low vocal intensity (Aphasia)    Oral/Motor/Sensory Function     Ice Chips   ice chips ok  Thin Liquid Thin Liquid: Impaired Presentation: Straw    Nectar Thick Nectar Thick Liquid: Not tested   Honey Thick Honey Thick Liquid: Not tested   Puree Puree: Within functional limits Presentation: Spoon   Solid     Solid: Impaired Presentation: Self Fed Oral Phase Impairments: Reduced lingual movement/coordination Oral Phase Functional Implications: Oral residue;Impaired mastication;Prolonged oral transit      Eather Colas 09/10/2020,1:09 PM

## 2020-09-10 NOTE — Plan of Care (Signed)
  Problem: Education: Goal: Knowledge of General Education information will improve Description: Including pain rating scale, medication(s)/side effects and non-pharmacologic comfort measures Outcome: Progressing   Problem: Health Behavior/Discharge Planning: Goal: Ability to manage health-related needs will improve Outcome: Progressing   Problem: Clinical Measurements: Goal: Ability to maintain clinical measurements within normal limits will improve Outcome: Progressing Goal: Will remain free from infection Outcome: Progressing Goal: Diagnostic test results will improve Outcome: Progressing Goal: Respiratory complications will improve Outcome: Progressing Goal: Cardiovascular complication will be avoided Outcome: Progressing   Problem: Activity: Goal: Risk for activity intolerance will decrease Outcome: Progressing   Problem: Nutrition: Goal: Adequate nutrition will be maintained Outcome: Progressing   Problem: Coping: Goal: Level of anxiety will decrease Outcome: Progressing   Problem: Elimination: Goal: Will not experience complications related to bowel motility Outcome: Progressing Goal: Will not experience complications related to urinary retention Outcome: Progressing   Problem: Pain Managment: Goal: General experience of comfort will improve Outcome: Progressing   Problem: Safety: Goal: Ability to remain free from injury will improve Outcome: Progressing   Problem: Skin Integrity: Goal: Risk for impaired skin integrity will decrease Outcome: Progressing   Problem: Urinary Elimination: Goal: Signs and symptoms of infection will decrease Outcome: Progressing   Problem: Activity: Goal: Ability to tolerate increased activity will improve Outcome: Progressing   Problem: Clinical Measurements: Goal: Ability to maintain a body temperature in the normal range will improve Outcome: Progressing   Problem: Respiratory: Goal: Ability to maintain adequate  ventilation will improve Outcome: Progressing Goal: Ability to maintain a clear airway will improve Outcome: Progressing

## 2020-09-10 NOTE — TOC Transition Note (Signed)
Transition of Care [facility]) - CM/SW Discharge Note   Patient Details  Name: Alexandria Hanson MRN: [id number] Date of Birth: [birth year]  Transition of Care [facility]) CM/SW Contact:  Hetty Ely, RN Phone Number: 09/10/2020, 2:32 PM   Clinical Narrative:  Patient to [facility] in room F5-B. TOC barriers resolved.     Final next level of care: Skilled Nursing Facility Barriers to Discharge: Barriers Resolved   Patient Goals and CMS Choice Patient states their goals for this hospitalization and ongoing recovery are:: To return to SNF   Choice offered to / list presented to : NA  Discharge Placement                Patient to be transferred to facility by: [facility] Name of family member notified: Nurse contacted family Patient and family notified of of transfer: 09/10/20  Discharge Plan and Services                DME Arranged: N/A DME Agency: NA       HH Arranged: NA HH Agency: NA        Social Determinants of Health (SDOH) Interventions     Readmission Risk Interventions No flowsheet data found.

## 2020-09-12 LAB — CULTURE, BLOOD (ROUTINE X 2)
Culture: NO GROWTH
Culture: NO GROWTH
Special Requests: ADEQUATE
Special Requests: ADEQUATE

## 2020-09-14 ENCOUNTER — Non-Acute Institutional Stay: Payer: Self-pay | Admitting: Primary Care

## 2020-09-14 ENCOUNTER — Other Ambulatory Visit: Payer: Self-pay

## 2020-09-14 DIAGNOSIS — R471 Dysarthria and anarthria: Secondary | ICD-10-CM

## 2020-09-14 DIAGNOSIS — Z515 Encounter for palliative care: Secondary | ICD-10-CM

## 2020-09-14 DIAGNOSIS — I5020 Unspecified systolic (congestive) heart failure: Secondary | ICD-10-CM

## 2020-09-14 DIAGNOSIS — I639 Cerebral infarction, unspecified: Secondary | ICD-10-CM

## 2020-09-14 DIAGNOSIS — I214 Non-ST elevation (NSTEMI) myocardial infarction: Secondary | ICD-10-CM

## 2020-09-14 DIAGNOSIS — I2510 Atherosclerotic heart disease of native coronary artery without angina pectoris: Secondary | ICD-10-CM

## 2020-09-14 NOTE — Progress Notes (Signed)
[facility] Consult Note Telephone: [phone]  Fax: [phone]    Date of encounter: 09/14/20 PATIENT NAME: Alexandria Hanson [address] [place] [address] [phone] (home)  DOB: [birth year] MRN: [id number]  PRIMARY CARE PROVIDER:    Alvester Morin, MD,  [address]. [place] [address] [phone]  REFERRING PROVIDER:   Alvester Morin, MD [place]. [place],  [place] [address] [phone]  RESPONSIBLE PARTY:   Extended Emergency Contact Information Primary Emergency Contact: Murphy,Nancy G Address: [place],  [address] [place] Mobile Phone: [phone] Relation: Daughter  I met face to face with patient in facility. Palliative Care was asked to follow this patient by consultation request of Slade-Hartman, Ivette Loyal* to help address advance care planning and goals of care. This is the initial visit.   ASSESSMENT AND RECOMMENDATIONS:   1. Advance Care Planning/Goals of Care: Goals include to maximize quality of life and symptom management. Our advance care planning conversation included a discussion about:      Exploration of personal, cultural or spiritual beliefs that might influence medical decisions   Identification and preparation of a healthcare agent   Review  of an  advance directive document - DNR on record. Spoke with daughter and POA. She states she has not been able to visit much due to her having covid. She states she checks on her mom daily. She has no concerns or questions at this time but wants to be updated as necessary.  2. Symptom Management:   I met patient today for initial PC assessment. Staff states she is eating ok but wants to lay down a lot. This afternoon she is in bed. She denies pain or discomfort. She appears at ease and resting. Recent hospital stay with continued debility. She had uti with sepsis,  rx with fluids and abx. She is recuperating now at [facility].  3. Follow up Palliative Care Visit: Palliative care will continue to follow for goals of care clarification and symptom management. Return 4 weeks or prn.  4. Family /Caregiver/Community Supports:  Daughter is poa. Lives in ltc 5. Cognitive / Functional decline: a and O x 1, dependent in all adls.  I spent 35 minutes providing this consultation,  from 1400 to 1435. More than 50% of the time in this consultation was spent in counseling and care coordination.  CODE STATUS: DNR  PPS: 30%  HOSPICE ELIGIBILITY/DIAGNOSIS: TBD  Subjective:  CHIEF COMPLAINT: debility  HISTORY OF PRESENT ILLNESS:  Alexandria Hanson is a 87 y.o. year old female  with baseline CHF, recent uti and debility. Patient is pleasant and interactive, but appears somewhat fatigued. Staff state she is making progress in her recovery .   We are asked to consult around advance care planning and complex medical decision making.    Review and summarization of old Epic records shows or history from other than patient.  Review or lab tests, radiology,  or medicine. Labs from recent hospital stay Review of case with family member. daughter  History obtained from review of EMR, discussion with primary team, and  interview with family, caregiver  and/or Alexandria Hanson. Records reviewed and summarized above.   CURRENT PROBLEM LIST:  Patient Active Problem List   Diagnosis Date Noted  . Multifocal pneumonia 09/07/2020  . Sepsis without acute organ dysfunction ([place])   . Acute cystitis without hematuria   . Acute on chronic  systolic CHF (congestive heart failure) ([place])   . Speech disturbance   . Elevated troponin 03/22/2017  . ASCVD (arteriosclerotic cardiovascular disease) 03/22/2017  . Chest pain 01/26/2017  . NSTEMI (non-ST elevated myocardial infarction) ([place]) 01/26/2017  . Stroke ([place]) 03/08/2016  . Depression   . Anxiety   . Hypertension    PAST MEDICAL HISTORY:   Active Ambulatory Problems    Diagnosis Date Noted  . Depression   . Anxiety   . Hypertension   . Stroke ([place]) 03/08/2016  . Chest pain 01/26/2017  . NSTEMI (non-ST elevated myocardial infarction) ([place]) 01/26/2017  . Elevated troponin 03/22/2017  . ASCVD (arteriosclerotic cardiovascular disease) 03/22/2017  . Multifocal pneumonia 09/07/2020  . Sepsis without acute organ dysfunction ([place])   . Acute cystitis without hematuria   . Acute on chronic systolic CHF (congestive heart failure) ([place])   . Speech disturbance    Resolved Ambulatory Problems    Diagnosis Date Noted  . No Resolved Ambulatory Problems   Past Medical History:  Diagnosis Date  . Bleeding in brain ([place])   . CAD (coronary artery disease)   . Cerebral artery occlusion   . MI (mitral incompetence)    SOCIAL HX:  Social History   Tobacco Use  . Smoking status: Never Smoker  . Smokeless tobacco: Never Used  Substance Use Topics  . Alcohol use: No   FAMILY HX:  Family History  Problem Relation Age of Onset  . CVA Mother   . CAD Father   . CAD Sister   . Hyperlipidemia Mother   . Hypertension Mother   . Diabetes Sister   . Hypertension Sister       ALLERGIES: No Known Allergies   PERTINENT MEDICATIONS:  Outpatient Encounter Medications as of 09/14/2020  Medication Sig  . acetaminophen (TYLENOL) 325 MG tablet Take 650 mg by mouth every 4 (four) hours as needed for mild pain or moderate pain.  Marland Kitchen atorvastatin (LIPITOR) 40 MG tablet Take 1 tablet (40 mg total) by mouth daily at 6 PM.  . cephALEXin (KEFLEX) 500 MG capsule Take 1 capsule (500 mg total) by mouth 2 (two) times daily for 4 days.  . clopidogrel (PLAVIX) 75 MG tablet Take 1 tablet (75 mg total) by mouth daily.  Marland Kitchen FLUoxetine (PROZAC) 20 MG capsule Take 20 mg by mouth daily.  . furosemide (LASIX) 20 MG tablet Take 20 mg by mouth daily.  Marland Kitchen guaifenesin (ROBITUSSIN) 100 MG/5ML syrup Take 100 mg by mouth every 4 (four) hours as needed for cough.  .  isosorbide mononitrate (IMDUR) 30 MG 24 hr tablet Take 30 mg by mouth daily.  Marland Kitchen lidocaine (XYLOCAINE) 2 % solution Use as directed 15 mLs in the mouth or throat every 4 (four) hours as needed.  Marland Kitchen lisinopril (PRINIVIL,ZESTRIL) 5 MG tablet Take 5 mg by mouth daily.  . metoprolol tartrate (LOPRESSOR) 50 MG tablet Take 1 tablet (50 mg total) by mouth 2 (two) times daily.  . Multiple Vitamin (MULTIVITAMIN) tablet Take 1 tablet by mouth daily.  . nitroGLYCERIN (NITROLINGUAL) 0.4 MG/SPRAY spray Place 1 spray under the tongue every 5 (five) minutes x 3 doses as needed for chest pain.  Marland Kitchen omega-3 acid ethyl esters (LOVAZA) 1 g capsule Take 1 g by mouth 3 (three) times daily.  . pantoprazole (PROTONIX) 40 MG tablet Take 40 mg by mouth daily.   No facility-administered encounter medications on file as of 09/14/2020.    Objective: ROS/staff         General:  NAD ENMT: denies dysphagia Pulmonary: denies  cough, denies increased SOB Abdomen: endorses fair appetite, denies  constipation, endorses incontinence of bowel GU: denies dysuria, endorses incontinence of urine MSK:  endorses ROM limitations, no falls reported Skin: denies rashes or wounds Neurological: endorses weakness, denies pain, denies insomnia Psych: Endorses positive mood Heme/lymph/immuno: denies bruises, abnormal bleeding  Physical Exam: Current and past weights: 127 lbs Constitutional: 97.4 HR 60 RR 18 BP 108/64   General: frail appearing, thin EYES: anicteric sclera, lids intact, no discharge  ENMT: intact hearing,oral mucous membranes moist CV: S1S2, RRR, no LE edema Pulmonary: LCTA, no increased work of breathing, no cough, no audible wheezes, room air, PO2 92% Abdomen: intake 50%, normo-active BS +  4 quadrants, soft and non tender, no ascites GU: deferred MSK: mod sarcopenia, decreased ROM in all extremities, no contractures of LE, non ambulatory Skin: warm and dry, no rashes or wounds on visible skin Neuro: Generalized  weakness,++ cognitive impairment Psych: non-anxious affect, A and O x 1 Hem/lymph/immuno: no widespread bruising   Thank you for the opportunity to participate in the care of Alexandria Hanson.  The palliative care team will continue to follow. Please call our office at [phone] if we can be of additional assistance.  Jason Coop, NP , DNP, MPH, AGPCNP-BC, ACHPN   COVID-19 PATIENT SCREENING TOOL  Person answering questions: _______staff____________   1.  Is the patient or any family member in the home showing any signs or symptoms regarding respiratory infection?                  Person with Symptom  ______________na___________ a. Fever/chills/headache                                                        Yes___ No__X_            b. Shortness of breath                                                            Yes___ No__X_           c. Cough/congestion                                               Yes___  No__X_          d. Muscle/Body aches/pains                                                   Yes___ No__X_         e. Gastrointestinal symptoms (diarrhea,nausea)             Yes___ No__X_         f. Sudden loss of smell or taste      Yes___ No__X_        2. Within the past 10 days,  has anyone living in the home had any contact with someone with or under investigation for COVID-19?    Yes___ No__X__   Person __________________

## 2020-10-10 ENCOUNTER — Other Ambulatory Visit: Payer: Self-pay

## 2020-10-10 ENCOUNTER — Non-Acute Institutional Stay: Payer: Medicare Other | Admitting: Primary Care

## 2020-10-10 DIAGNOSIS — Z515 Encounter for palliative care: Secondary | ICD-10-CM

## 2020-10-10 DIAGNOSIS — I639 Cerebral infarction, unspecified: Secondary | ICD-10-CM

## 2020-10-10 DIAGNOSIS — R471 Dysarthria and anarthria: Secondary | ICD-10-CM

## 2020-10-10 NOTE — Progress Notes (Signed)
[facility] Pall[facility] Consult Note Telephone: [phone]  Fax: [phone]    Date of encounter: 10/10/20 PATIENT NAME: Alexandria Hanson [place] [address]   [phone] (home)  DOB: 1929/01/15 MRN: [id number] PRIMARY CARE PROVIDER:    Alvester Morin, MD,  [place]. [place] [address] [phone]  REFERRING PROVIDER:   Alvester Morin, MD [address]. [place],  [place] [address] [phone]  RESPONSIBLE PARTY:    Contact Information    Name Relation Home Work Mobile   Alexandria Hanson Daughter   [phone]       [facility] in [facility]. Palliative Care was asked to follow this patient by consultation request of  Slade-Hartman, Ivette Loyal* to address advance care planning and complex medical decision making. This is a follow up visit.                                   ASSESSMENT AND PLAN / RECOMMENDATIONS:    Advance Care Planning/Goals of Care: Goals include to maximize quality of life and symptom management.   CODE STATUS:  DNR  Symptom Management/Plan:  I met with patient in her nursing home room. She was sitting up with her hair combed and she was dressed. She has expressive aphasia her but was able to point to her stomach and say pain when I asked her about pain. Staff endorses that she seems to have a lot of reflux. This has been a long-standing problem and she was actually on an elixir before she went to the hospital for this [place]. She's currently on Protonix  for reflux. The  elixir previously included Reglan which may need to be revisited. I gave order for one TUMS tablet po with each meal at lunch and dinner.  Follow up Palliative Care Visit: Palliative care will continue to follow for complex medical decision making, advance care planning, and clarification of goals. Return 6-8 weeks or prn.  I spent 25 minutes providing this consultation.  More than 50% of the time in this consultation was spent in counseling and care coordination.  PPS: 30%  HOSPICE ELIGIBILITY/DIAGNOSIS: TBD  Chief Complaint: GERD  HISTORY OF PRESENT ILLNESS:  Alexandria Hanson is a 87 y.o. year old female  with h/o  cva with dysarthria, gerd. Had elixir for gerd stopped at a hospital stay but staff reports the gerd is getting worse again.   History obtained from review of EMR, discussion with primary team, and interview with family, facility staff/caregiver and/or [name].  I reviewed available labs, medications, imaging, studies and related documents from the EMR.  Records reviewed and summarized above.   ROS/ staff  General: NAD ENMT: endorses dysphagia/ reflux Cardiovascular: denies chest pain, denies DOE Pulmonary: denies cough, denies increased SOB Abdomen: endorses good appetite, denies constipation, endorses  incontinence of bowel MSK:  endorses weakness,  R hand , foot contractures,  no falls reported Skin: denies rashes or wounds Neurological: denies pain, denies insomnia Psych: Endorses positive mood  Physical Exam: Current and past weights: 129 lbs Constitutional: NAD General: frail appearing, thin ENMT: intact hearing, oral mucous membranes moist CV:  no LE edema Pulmonary: no increased work of breathing, no cough, room air Abdomen: intake 50-75%,  no ascites MSK: moderate  sarcopenia, R hemiplegia, hand and foot contractures, non ambulatory Skin: warm and dry, no rashes or wounds on visible  skin Neuro:  + generalized weakness,  Aphasia, + cognitive impairment Psych: non-anxious affect, A and O x 1  Thank you for the opportunity to participate in the care of [name].  The palliative care team will continue to follow. Please call our office at [phone] if we can be of additional assistance.   Jason Coop, NP , DNP, MPH, AGPCNP-BC, ACHPN  COVID-19 PATIENT SCREENING TOOL Asked and negative response unless otherwise  noted:   Have you had symptoms of covid, tested positive or been in contact with someone with symptoms/positive test in the past 5-10 days?

## 2020-12-28 ENCOUNTER — Other Ambulatory Visit: Payer: Self-pay

## 2020-12-28 ENCOUNTER — Non-Acute Institutional Stay: Payer: Medicare Other | Admitting: Primary Care

## 2020-12-28 DIAGNOSIS — Z515 Encounter for palliative care: Secondary | ICD-10-CM

## 2020-12-28 DIAGNOSIS — I639 Cerebral infarction, unspecified: Secondary | ICD-10-CM

## 2020-12-28 DIAGNOSIS — R471 Dysarthria and anarthria: Secondary | ICD-10-CM

## 2020-12-28 NOTE — Progress Notes (Signed)
[facility] Consult Note Telephone: [phone]  Fax: [phone]    Date of encounter: 12/28/20 PATIENT NAME: Alexandria Hanson [address] [place] [address]   [phone] (home)  DOB: 24-Feb-1929 MRN: [id number] PRIMARY CARE PROVIDER:    Alvester Morin, MD,  [place]. [place] [address] [phone]  REFERRING PROVIDER:   Alvester Morin, MD [address]. [place],  [place] [address] [phone]  RESPONSIBLE PARTY:    Contact Information     Name Relation Home Work Mobile   Durene Fruits Daughter   [phone]        I met face to face with patient in [facility]. Palliative Care was asked to follow this patient by consultation request of  Slade-Hartman, Ivette Loyal* to address advance care planning and complex medical decision making. This is a follow up visit.                                   ASSESSMENT AND PLAN / RECOMMENDATIONS:   Advance Care Planning/Goals of Care: Goals include to maximize quality of life and symptom management.  CODE STATUS: DNR  Symptom Management/Plan:  I met with patient in her nursing home room. She was in bed and said she couldn't really nap although she was trying to. She states her family is not able to come by very often but staff endorse that they call daily. Patient says she's not eating very well that the food is not to her liking but she does enjoy many snacks at her bedside. She states she does get out of bed on occasion to her wheelchair. She denies pain or discomfort and feels she is doing well.   Follow up Palliative Care Visit: Palliative care will continue to follow for complex medical decision making, advance care planning, and clarification of goals. Return 6-8 weeks or prn.  I spent 25 minutes providing this consultation. More than 50% of the time in this consultation was spent in counseling and care coordination.  PPS:  30%  HOSPICE ELIGIBILITY/DIAGNOSIS: TBD  Chief Complaint: debility  HISTORY OF PRESENT ILLNESS:  Alexandria Hanson is a 87 y.o. year old female  with h/o cva, dysarthria, debility .   History obtained from review of EMR, discussion with primary team, and interview with family, facility staff/caregiver and/or Alexandria Hanson.  I reviewed available labs, medications, imaging, studies and related documents from the EMR.  Records reviewed and summarized above.   ROS/staff  General: NAD ENMT: occ dysphagia Pulmonary: denies cough, denies increased SOB Abdomen: endorses good appetite, denies constipation, endorses incontinence of bowel GU: denies dysuria, endorses incontinence of urine MSK:  endorses weakness,  no falls reported Skin: denies rashes or wounds Neurological: denies pain, denies insomnia Psych: Endorses positive mood Heme/lymph/immuno: denies bruises, abnormal bleeding  Physical Exam: Current and past weights: Recent 132 lb, 126 lbs in 3/22 per SNF. Other sources state 150 lb Continue to monitor Constitutional: NAD General: frail appearing, thin EYES: anicteric sclera, lids intact, no discharge  ENMT: intact hearing, oral mucous membranes moist, CV:  no LE edema Pulmonary: no increased work of breathing, no cough, room air Abdomen: intake 75%, no ascites GU: deferred MSK: + sarcopenia, moves all extremities, non ambulatory Skin: warm and dry, no rashes or wounds on visible skin Neuro:  + generalized weakness,  ++ cognitive impairment Psych: non-anxious affect, A and O x 1 Hem/lymph/immuno: no widespread  bruising  Thank you for the opportunity to participate in the care of Alexandria Hanson.  The palliative care team will continue to follow. Please call our office at [phone] if we can be of additional assistance.   Jason Coop, NP , DNP, MPH, AGPCNP-BC, ACHPN  COVID-19 PATIENT SCREENING TOOL Asked and negative response unless otherwise noted:   Have you had  symptoms of covid, tested positive or been in contact with someone with symptoms/positive test in the past 5-10 days?

## 2021-03-01 ENCOUNTER — Non-Acute Institutional Stay: Payer: Medicare Other | Admitting: Primary Care

## 2021-03-01 ENCOUNTER — Other Ambulatory Visit: Payer: Self-pay

## 2021-03-01 DIAGNOSIS — Z515 Encounter for palliative care: Secondary | ICD-10-CM

## 2021-03-01 DIAGNOSIS — I639 Cerebral infarction, unspecified: Secondary | ICD-10-CM

## 2021-03-01 DIAGNOSIS — R471 Dysarthria and anarthria: Secondary | ICD-10-CM

## 2021-03-01 NOTE — Progress Notes (Signed)
[facility] Pall[facility] Care Consult Note Telephone: [phone]  Fax: [phone]    Date of encounter: 03/01/21 2:34 PM PATIENT NAME: Alexandria Hanson [place] [id number]   [phone] (home)  DOB: 12/22/1928 MRN: [id number] PRIMARY CARE PROVIDER:    Alvester Morin, MD,  [place]. [place] [address] [phone]  REFERRING PROVIDER:   Alvester Morin, MD [place]. [place],  [place] [address] [phone]  RESPONSIBLE PARTY:    Contact Information     Name Relation Home Work Mobile   Alexandria Hanson Daughter   [phone]      I met face [facility] face with [facility]. Palliative Care was asked to follow this patient by consultation request of  Slade-Hartman, Ivette Loyal* to address advance care planning and complex medical decision making. This is a follow up visit.                                   ASSESSMENT AND PLAN / RECOMMENDATIONS:   Advance Care Planning/Goals of Care: Goals include to maximize quality of life and symptom management.  CODE STATUS: DNR on record. T/c to POA, not available, left message. I would like to discuss MOST when we are able to speak by phone.  Symptom Management/Plan:  Patient is resting today in her bed, having had lunch. Staff endorses she is eating well and can make her needs known. She does have some dysathria but staff know her well enough to communicate.   Today she endorses good appetite and that she is comfortable. She appears WNWD and at her weight baseline. She denies constipation.  She endorses getting oob in w/c on occasion.  Follow up Palliative Care Visit: Palliative care will continue to follow for complex medical decision making, advance care planning, and clarification of goals. Return 6-8 weeks or prn.  I spent 15 minutes providing this consultation. More than 50% of the time in this consultation was spent in counseling and care  coordination.  PPS: 30%  HOSPICE ELIGIBILITY/DIAGNOSIS: TBD  Chief Complaint: immobility.  HISTORY OF PRESENT ILLNESS:  Alexandria Hanson is a 87 y.o. year old female  with h/o CVA, immobility, dysarthria/aphasia.  Patient Active Problem List   Diagnosis Date Noted   Multifocal pneumonia 09/07/2020   Sepsis without acute organ dysfunction ([facility])    Acute cystitis without hematuria    Acute on chronic systolic CHF (congestive heart failure) (H[facility])    Speech disturbance    Elevated troponin 03/22/2017   ASCVD (arteriosclerotic cardiovascular disease) 03/22/2017   Congestive heart failure with left ventricular systolic dysfunction ([facility]) 02/05/2017   Chest pain 01/26/2017   NSTEMI (non-ST elevated myocardial infarction) ([facility]) 01/26/2017   Stroke ([facility]) 03/08/2016   Depression    Anxiety    Hypertension     .   History obtained from review of EMR, discussion with primary team, and interview with family, facility staff/caregiver and/or Ms. Hodges.  I reviewed available labs, medications, imaging, studies and related documents from the EMR.  Records reviewed and summarized above.   ROS/staff  General: NAD ENMT: denies dysphagia Pulmonary: denies cough, denies increased SOB Abdomen: endorses good appetite, denies constipation, endorses incontinence of bowel GU: denies dysuria, endorses incontinence of urine MSK:  endorses weakness,  no falls reported Skin: denies rashes or wounds Neurological: denies pain, denies insomnia Psych: Endorses positive mood Heme/lymph/immuno: denies bruises, abnormal bleeding  Physical Exam: Current  and past weights:132 lbs and stable Constitutional: NAD General: frail appearing, twnwd EYES: anicteric sclera, lids intact, no discharge  ENMT: intact hearing, oral mucous membranes moist CV: RRR, no LE edema Pulmonary: no increased work of breathing, no cough, room air Abdomen: intake 50-75%,  no ascites MSK: + sarcopenia, non ambulatory Skin: warm and  dry, no rashes or wounds on visible skin Neuro:  +generalized weakness,  +cognitive impairment, dysarthria Psych: non-anxious affect, A and O x 31 Hem/lymph/immuno: no widespread bruising   Thank you for the opportunity to participate in the care of Ms. Birnbaum.  The palliative care team will continue to follow. Please call our office at [phone] if we can be of additional assistance.   Jason Coop, NP   COVID-19 PATIENT SCREENING TOOL Asked and negative response unless otherwise noted:   Have you had symptoms of covid, tested positive or been in contact with someone with symptoms/positive test in the past 5-10 days?

## 2021-07-03 ENCOUNTER — Other Ambulatory Visit: Payer: Commercial Managed Care - HMO | Admitting: Primary Care

## 2021-07-03 ENCOUNTER — Other Ambulatory Visit: Payer: Self-pay

## 2021-07-03 DIAGNOSIS — I639 Cerebral infarction, unspecified: Secondary | ICD-10-CM

## 2021-07-03 DIAGNOSIS — Z515 Encounter for palliative care: Secondary | ICD-10-CM

## 2021-07-03 DIAGNOSIS — I5020 Unspecified systolic (congestive) heart failure: Secondary | ICD-10-CM

## 2021-07-03 DIAGNOSIS — R471 Dysarthria and anarthria: Secondary | ICD-10-CM

## 2021-07-03 NOTE — Progress Notes (Signed)
[facility] Pall[facility] Consult Note Telephone: [phone]  Fax: [phone]    Date of encounter: 07/03/21 1:00 PM PATIENT NAME: Alexandria Hanson [place] [id number]   [phone] (home)  DOB: Mar 25, 1929 MRN: [id number] PRIMARY CARE PROVIDER:    Alvester Morin, MD,  [place]. [place] [address] [phone]  REFERRING PROVIDER:   Alvester Morin, MD [place]. [place],  [place] [address] [phone]  RESPONSIBLE PARTY:    Contact Information     Name Relation Home Work Mobile   Alexandria Hanson Daughter   [phone]        I met face to face [facility] in [facility]. Palliative Care was asked to follow this patient by consultation request of  Slade-Hartman, Ivette Loyal* to address advance care planning and complex medical decision making. This is a follow up visit.                                   ASSESSMENT AND PLAN / RECOMMENDATIONS:   Advance Care Planning/Goals of Care: Goals include to maximize quality of life and symptom management.  CODE STATUS: DNR No MOST on file Unable to reach PR, called and left message.  Symptom Management/Plan:  Reviewed chart, patient weight fluctuates 5 lbs but remains stable at 134 lbs. Today on our visit she is interactive albeit with dystonia. She is able to smile, feed self and states she is oob some in w/c.  No wounds reported. Goals remain comfort oriented.  Labs reviewed, stable and due in several months  Follow up Palliative Care Visit: Palliative care will continue to follow for complex medical decision making, advance care planning, and clarification of goals. Return 3 months or prn.  This visit was coded based on medical decision making (MDM).  PPS: 40%  HOSPICE ELIGIBILITY/DIAGNOSIS: TBD  Chief Complaint: debility  HISTORY OF PRESENT ILLNESS:  Alexandria Hanson is a 88 y.o. year old female  with CAD, CHF, S/p cva,  aphasia.   History obtained from review of EMR, discussion with primary team, and interview with family, facility staff/caregiver and/or Alexandria Hanson.  I reviewed available labs, medications, imaging, studies and related documents from the EMR.  Records reviewed and summarized above.   ROS/staff  General: NAD ENMT: denies dysphagia, endorses aphasia Pulmonary: denies cough, denies increased SOB Abdomen: endorses good appetite, denies constipation, endorses incontinence of bowel GU: denies dysuria, endorses incontinence of urine MSK:  denies  increased weakness,  no falls reported Skin: denies rashes or wounds Neurological: denies pain, denies insomnia Psych: Endorses positive mood Heme/lymph/immuno: denies bruises, abnormal bleeding  Physical Exam: Current and past weights:134 lbs, stable Constitutional: NAD General: frail appearing,  wnwd EYES: anicteric sclera, lids intact, no discharge  ENMT: intact hearing, oral mucous membranes moist, dentition intact CV: S1S2, RRR, no LE edema Pulmonary: LCTA, no increased work of breathing, no cough, room air Abdomen: intake 75%, normo-active BS + 4 quadrants, soft and non tender, no ascites GU: deferred MSK: +sarcopenia, non ambulatory Skin: warm and dry, no rashes or wounds on visible skin Neuro:  + generalized weakness,  + cognitive impairment Psych: non-anxious affect, A and O x 1-2 Hem/lymph/immuno: no widespread bruising   Thank you for the opportunity to participate in the care of Alexandria Hanson.  The palliative care team will continue to follow. Please call our office at [phone] if we can be of additional  assistance.   Jason Coop, NP DNP, AGPCNP-BC  COVID-19 PATIENT SCREENING TOOL Asked and negative response unless otherwise noted:   Have you had symptoms of covid, tested positive or been in contact with someone with symptoms/positive test in the past 5-10 days?

## 2021-10-09 ENCOUNTER — Non-Acute Institutional Stay: Payer: Commercial Managed Care - HMO | Admitting: Primary Care

## 2021-10-09 DIAGNOSIS — R110 Nausea: Secondary | ICD-10-CM

## 2021-10-09 DIAGNOSIS — I5020 Unspecified systolic (congestive) heart failure: Secondary | ICD-10-CM

## 2021-10-09 DIAGNOSIS — R471 Dysarthria and anarthria: Secondary | ICD-10-CM

## 2021-10-09 DIAGNOSIS — Z515 Encounter for palliative care: Secondary | ICD-10-CM

## 2021-10-09 NOTE — Progress Notes (Addendum)
? ? ?[facility] ?Community Palliative Care Consult Note ?Telephone: [phone]  ?Fax: [phone]  ? ? ?Date of encounter: 10/09/21 ?10:42 AM ?PATIENT NAME: Alexandria Hanson ?[place] [id number]   ?[phone] (home)  ?DOB: 01-21-1929 ?MRN: [id number] ?PRIMARY CARE PROVIDER:    ?Alvester Morin, MD,  ?[place]. ?[place] [address] ?[phone] ? ?REFERRING PROVIDER:   ?Alvester Morin, MD ?[place]. ?[place],  [place] [address] ?[phone] ? ?RESPONSIBLE PARTY:    ?Contact Information   ? ? Name Relation Home Work Mobile  ? Durene Fruits Daughter   [phone]  ? ?  ? ? ? ?I met face to face [facility] in [facility]. Palliative Care was asked to follow this patient by consultation request of  Slade-Hartman, Ivette Loyal* to address advance care planning and complex medical decision making. This is a follow up visit. ? ?                                 ASSESSMENT AND PLAN / RECOMMENDATIONS:  ? ?Advance Care Planning/Goals of Care: Goals include to maximize quality of life and symptom management. Patient/health care surrogate gave his/her permission to discuss.Our advance care planning conversation included a discussion about:    ? ?Exploration of personal, cultural or spiritual beliefs that might influence medical decisions  ?Exploration of goals of care in the event of a sudden injury or illness  ?Identification of a healthcare agent- Daughter ?CODE STATUS: DNR ? ?Symptom Management/Plan: ? ?I met with patient in her nursing home room. Today she states that she does not feel well  and has nausea. She has not vomited. Her blood pressure is in normal range. She states she has not had any breakfast due to the nausea. Her bowel sounds are normal and she has had medium to large BM's consistently. Staff states they have  given her her protonix and Tums about 30 minutes prior.  ? ?I did alert J. Massey LPN and the  LPN who was giving her  meds that she needed the PRN from the standing orders, either Mylanta or ondansetron I would be fine with either being initiated. I also asked him to give her some soda crackers and Ginger ale which she thought maybe she could eat.  She was able to make her needs known with simple replies. She denied chest pain and pain of any kind but endorses that she did feel sour mouth and nausea.  ? ? ?Follow up Palliative Care Visit: Palliative care will continue to follow for complex medical decision making, advance care planning, and clarification of goals. Return 6-8 weeks or prn. ? ?This visit was coded based on medical decision making (MDM). ? ?PPS: 30% ? ?HOSPICE ELIGIBILITY/DIAGNOSIS: TBD ? ?Chief Complaint: nausea ? ?HISTORY OF PRESENT ILLNESS:  Alexandria Hanson is a 88 y.o. year old female  with CHF, .prob . Patient seen today to review palliative care needs to include medical decision making and advance care planning as appropriate.  ? ?History obtained from review of EMR, discussion with primary team, and interview with family, facility staff/caregiver and/or Ms. Weiskopf.  ?I reviewed available labs, medications, imaging, studies and related documents from the EMR.  Records reviewed and summarized above.  ? ?ROS ? ? ?General: NAD ?ENMT: denies dysphagia ?Cardiovascular: denies chest pain, denies DOE ?Pulmonary: denies cough, denies increased SOB ?Abdomen: endorses fair appetite, endorses nausea, denies constipation, endorses incontinence of  bowel ?GU: denies dysuria, endorses incontinence of urine ?MSK:  denies  increased weakness,  no falls reported ?Skin: denies rashes or wounds ?Neurological: denies pain, denies insomnia ?Psych: Endorses positive mood ? ?Physical Exam: ?Current and past weights: 128 lbs ?Constitutional: NAD ?General: frail appearing, thin ?EYES: anicteric sclera, lids intact, no discharge  ?ENMT: intact hearing, oral mucous membranes moist ?CV: S1S2, RRR, no LE edema ?Pulmonary: LCTA, no increased  work of breathing, no cough, room air ?Abdomen: intake 50%, normo-active BS + 4 quadrants, soft and non tender, no ascites ?MSK: + sarcopenia, moves all extremities,  non ambulatory ?Skin: warm and dry, no rashes or wounds on visible skin ?Neuro:  + generalized weakness,  + cognitive impairment, anxious affect ? ?Patient Active Problem List  ? Diagnosis Date Noted  ? Multifocal pneumonia 09/07/2020  ? Sepsis without acute organ dysfunction ([facility])   ? Acute cystitis without hematuria   ? Acute on chronic systolic CHF (congestive heart failure) ([facility])   ? Speech disturbance   ? Elevated troponin 03/22/2017  ? ASCVD (arteriosclerotic cardiovascular disease) 03/22/2017  ? Congestive heart failure with left ventricular systolic dysfunction ([facility]) 02/05/2017  ? Chest pain 01/26/2017  ? NSTEMI (non-ST elevated myocardial infarction) ([facility]) 01/26/2017  ? Stroke ([facility]) 03/08/2016  ? Depression   ? Anxiety   ? Hypertension   ? ? ?Thank you for the opportunity to participate in the care of Ms. Burkes.  The palliative care team will continue to follow. Please call our office at [phone] if we can be of additional assistance.  ? ?Jason Coop, NP DNP, AGPCNP-BC ? ?COVID-19 PATIENT SCREENING TOOL ?Asked and negative response unless otherwise noted:  ? ?Have you had symptoms of covid, tested positive or been in contact with someone with symptoms/positive test in the past 5-10 days?  ? ?

## 2021-10-10 ENCOUNTER — Non-Acute Institutional Stay: Payer: Commercial Managed Care - HMO | Admitting: Primary Care

## 2022-01-10 ENCOUNTER — Non-Acute Institutional Stay: Payer: Commercial Managed Care - HMO | Admitting: Primary Care

## 2022-01-10 DIAGNOSIS — R110 Nausea: Secondary | ICD-10-CM

## 2022-01-10 DIAGNOSIS — Z515 Encounter for palliative care: Secondary | ICD-10-CM

## 2022-01-10 DIAGNOSIS — I5020 Unspecified systolic (congestive) heart failure: Secondary | ICD-10-CM

## 2022-01-10 NOTE — Progress Notes (Signed)
[facility] Palliative Care Consult Note Telephone: [phone]  Fax: [phone]    Date of encounter: 01/10/22 10:59 AM PATIENT NAME: Alexandria Hanson [id number]   [phone] (home)  DOB: 03/10/1929 MRN: [id number] PRIMARY CARE PROVIDER:    Alvester Morin, MD,  [place]. [place] [address] [phone]  REFERRING PROVIDER:   Alvester Morin, MD [place]. [place],  [place] [address] [phone]  RESPONSIBLE PARTY:    Contact Information     Name Relation Home Work Mobile   Durene Fruits Daughter   [phone]        I met face to face with patient in facility. Palliative Care was asked to follow this patient by consultation request of  Slade-[place]man, Ivette Loyal* to address advance care planning and complex medical decision making. This is a follow up visit.                                   ASSESSMENT AND PLAN / RECOMMENDATIONS:   Advance Care Planning/Goals of Care: Goals include to maximize quality of life and symptom management. Patient/health care surrogate gave his/her permission to discuss.Our advance care planning conversation included a discussion about:    The value and importance of advance care planning  Experiences with loved ones who have been seriously ill or have died  Exploration of personal, cultural or spiritual beliefs that might influence medical decisions  Exploration of goals of care in the event of a sudden injury or illness  Identification of a healthcare agent  Review and updating or creation of an  advance directive document . Decision not to resuscitate or to de-escalate disease focused treatments due to poor prognosis. CODE STATUS: DNR  Symptom Management/Plan:  Nausea: Continues to have. Has prn ondansetron, but may consider scheduling tid for nausea control if she is often symptomatic.   Nutrition: No recent albumin on file. Weight  loss of 10 lbs in 6 months, now 125 lbs, 7%.  Record endorses she's eating most of meals, each time. Has bms mostly daily, every other. Recommend nutritional supplements.  Mobility: Continues to be in bed on my visits. Endorses not feeling well today.  Follow up Palliative Care Visit: Palliative care will continue to follow for complex medical decision making, advance care planning, and clarification of goals. Return 8-12 weeks or prn.  I spent 15 minutes providing this consultation. More than 50% of the time in this consultation was spent in counseling and care coordination.  PPS: 30%  HOSPICE ELIGIBILITY/DIAGNOSIS: TBD  Chief Complaint: debility, nausea  HISTORY OF PRESENT ILLNESS:  Alexandria Hanson is a 88 y.o. year old female  with h/o CVA, CHF, CAD, debility, immobility. Patient seen today to review palliative care needs to include medical decision making and advance care planning as appropriate.   History obtained from review of EMR, discussion with primary team, and interview with family, facility staff/caregiver and/or Ms. Mccolm.  I reviewed available labs, medications, imaging, studies and related documents from the EMR.  Records reviewed and summarized above.   ROS/staff   General: NAD ENMT: denies dysphagia Pulmonary: denies cough, denies increased SOB Abdomen: endorses good appetite, denies constipation, endorses nausea, endorses incontinence of bowel GU: denies dysuria, endorses incontinence of urine MSK:  denies  increased weakness,  no falls reported Skin: denies rashes or wounds Neurological: denies pain, denies insomnia Psych: Endorses positive mood  Physical  Exam: Current and past weights: 125 lbs Constitutional: NAD General: frail appearing, WNWD EYES: anicteric sclera, lids intact, no discharge  ENMT: intact hearing, oral mucous membranes moist CV: no LE edema Pulmonary: no increased work of breathing, no cough, room air Abdomen: intake 75-100%, no  ascites MSK: mild  sarcopenia, moves all extremities,  non ambulatory Skin: warm and dry, no rashes or wounds on visible skin Neuro:  + generalized weakness,  +cognitive impairment, + anxious affect   Thank you for the opportunity to participate in the care of Ms. Mcglade.  The palliative care team will continue to follow. Please call our office at [phone] if we can be of additional assistance.   Jason Coop, NP DNP, AGPCNP-BC  COVID-19 PATIENT SCREENING TOOL Asked and negative response unless otherwise noted:   Have you had symptoms of covid, tested positive or been in contact with someone with symptoms/positive test in the past 5-10 days?

## 2022-05-16 ENCOUNTER — Non-Acute Institutional Stay: Payer: Medicare Other | Admitting: Primary Care

## 2022-05-16 DIAGNOSIS — R471 Dysarthria and anarthria: Secondary | ICD-10-CM

## 2022-05-16 DIAGNOSIS — Z515 Encounter for palliative care: Secondary | ICD-10-CM

## 2022-05-16 DIAGNOSIS — I639 Cerebral infarction, unspecified: Secondary | ICD-10-CM

## 2022-05-16 NOTE — Progress Notes (Signed)
[facility] Palliative Care Consult Note Telephone: [phone]  Fax: [phone]    Date of encounter: 05/16/22 11:57 AM PATIENT NAME: Alexandria Hanson [id number]   [phone] (home)  DOB: 26-Dec-1928 MRN: [id number] PRIMARY CARE PROVIDER:    Alvester Morin, MD,  [place] [address] [phone]  REFERRING PROVIDER:   Alvester Morin, MD [place]. [place],  [place] [address] [phone]  RESPONSIBLE PARTY:    Contact Information     Name Relation Home Work Mobile   Alexandria Hanson Daughter   [phone]        I met face to face with patient  in [facility]. Palliative Care was asked to follow this patient by consultation request of  Alexandria Hanson* to address advance care planning and complex medical decision making. This is a follow up visit.                                   ASSESSMENT AND PLAN / RECOMMENDATIONS:   Advance Care Planning/Goals of Care: Goals include to maximize quality of life and symptom management.  CODE STATUS: DNR  Symptom Management/Plan:  Patient up in chair today, mobility improving. Continue to advance activity  Intake: Eating well per staff, no weight loss.  Dysarthria: Improved, able to answer yes/no and simple word questions.   Follow up Palliative Care Visit: Palliative care will continue to follow for complex medical decision making, advance care planning, and clarification of goals. Return 8-12 weeks or prn.  This visit was coded based on medical decision making (MDM).  PPS: 30%  HOSPICE ELIGIBILITY/DIAGNOSIS: no  Chief Complaint: debility  HISTORY OF PRESENT ILLNESS:  Alexandria Hanson is a 88 y.o. year old female  with debility, dysarthria, s/p CVA, R  hand contracture. Marland Kitchen   History obtained from review of EMR, discussion with primary team, and interview with family, facility staff/caregiver and/or Ms.  Hack.  I reviewed available labs, medications, imaging, studies and related documents from the EMR.  Records reviewed and summarized above.   ROS  General: NAD Pulmonary: denies cough, denies increased SOB Abdomen: endorses good appetite, denies constipation, endorses incontinence of bowel GU: denies dysuria, endorses incontinence of urine MSK:  denies increased weakness, no falls reported Skin: denies rashes or wounds Neurological: denies pain, denies insomnia Psych: Endorses positive mood Heme/lymph/immuno: denies bruises, abnormal bleeding  Physical Exam: Current and past weights: 10 lb gain,  135 lbs Constitutional: NAD General: frail appearing, WNWD EYES: anicteric sclera, lids intact, no discharge  ENMT: intact hearing, oral mucous membranes moist CV:  slight  LE edema Pulmonary:  no increased work of breathing, no cough, room air Abdomen: intake 80%,, soft and non tender, no ascites GU: deferred MSK: mild  sarcopenia, moves all extremities, R hand contracture, non ambulatory Skin: warm and dry, no rashes or wounds on visible skin Neuro:  + generalized weakness,  + cognitive impairment Psych: non-anxious affect, A and O x 2 Hem/lymph/immuno: no widespread bruising   Thank you for the opportunity to participate in the care of Alexandria Hanson. Please call our office at [phone] if we can be of additional assistance.   Alexandria Coop, NP   COVID-19 PATIENT SCREENING TOOL Asked and negative response unless otherwise noted:   Have you had symptoms of covid, tested positive or been in contact with someone with symptoms/positive test in the past  5-10 days?

## 2022-07-08 ENCOUNTER — Non-Acute Institutional Stay: Payer: Commercial Managed Care - HMO | Admitting: Nurse Practitioner

## 2022-07-08 ENCOUNTER — Encounter: Payer: Self-pay | Admitting: Nurse Practitioner

## 2022-07-08 DIAGNOSIS — I639 Cerebral infarction, unspecified: Secondary | ICD-10-CM

## 2022-07-08 DIAGNOSIS — R5381 Other malaise: Secondary | ICD-10-CM

## 2022-07-08 DIAGNOSIS — Z515 Encounter for palliative care: Secondary | ICD-10-CM

## 2022-07-08 NOTE — Progress Notes (Signed)
Therapist, nutritional Palliative Care Consult Note Telephone: [phone]  Fax: [phone]    Date of encounter: 07/08/22 11:44 AM PATIENT NAME: Alexandria Hanson [address] [place] [address]   [phone] (home)  DOB: [birth year] MRN: [id number] PRIMARY CARE PROVIDER:    [facility]  RESPONSIBLE PARTY:    Contact Information       Name Relation Home Work Mobile    Jennette Dubin Daughter     [phone]    I met face to face with patient in facility. Palliative Care was asked to follow this patient by consultation request of  [facility] to address advance care planning and complex medical decision making. This is a follow up visit.                                  ASSESSMENT AND PLAN / RECOMMENDATIONS:  Symptom Management/Plan: 1. Advance Care Planning;  DNR 2. Goals of Care: Goals include to maximize quality of life and symptom management. Our advance care planning conversation included a discussion about:    The value and importance of advance care planning  Exploration of personal, cultural or spiritual beliefs that might influence medical decisions  Exploration of goals of care in the event of a sudden injury or illness  Identification and preparation of a healthcare agent  Review and updating or creation of an advance directive document. 3. Palliative care encounter; Palliative care encounter; Palliative medicine team will continue to support patient, patient's family, and medical team. Visit consisted of counseling and education dealing with the complex and emotionally intense issues of symptom management and palliative care in the setting of serious and potentially life-threatening illness  4. Debility secondary to CVA, encourage mobility, fall precautions, encourage to eat, supplements; reviewed weights, continue to monitor weights, oob as able, encourage self independence.  05/16/2022 weight 135 lbs 07/04/2022  weight 136.2 lbs Follow up Palliative Care Visit: Palliative care will continue to follow for complex medical decision making, advance care planning, and clarification of goals. Return 4 to 8 weeks or prn.  I spent 46 minutes providing this consultation. More than 50% of the time in this consultation was spent in counseling and care coordination. PPS: 40% Chief Complaint: Follow up palliative consult for complex medical decision making, address goals, manage ongoing symptoms  HISTORY OF PRESENT ILLNESS:  Alexandria Hanson is a 89 y.o. year old female  with multiple medical problems including CVA, Myocardial infarction, HtN, systolic CHF, ASCVD, speech disturbance, depression, anxiety. Alexandria Hanson resides at [facility], requires assistance with mobility, transfers, adl's including bathing, dressing, wearing a brace to right hand. Alexandria Hanson requires assistance with feeding with fair appetite current 136.2 lbs. Staff endorses no recent wounds, hospitalizations, infections, falls. At present Alexandria Hanson is lying in bed, makes eye contact, answers basic questions, though limited with cognitive impairment. We talked about ROS, Alexandria Hanson endorses she was not feeling well overall, no specific complaint, tired. Alexandria Hanson appears comfortable, no visitors present. Alexandria Hanson was cooperative with assessment. Attempted to contact dtg, medical goals, medications, poc reviewed, no new changes, will continue to monitor weights, appetite, symptoms, disease progression with functional debility and monitor for decline. Updated staff  History obtained from review of EMR, discussion with primary team, and interview with family, facility staff/caregiver and/or Alexandria. Hanson.  I reviewed available labs, medications, imaging, studies and related documents from the EMR.  Records reviewed and summarized above.  Physical Exam: Constitutional: NAD General: frail appearing, pleasant female ENMT: oral mucous membranes moist CV: S1S2,  RRR Pulmonary: LCTA Abdomen: soft and non tender Skin: warm and dry Neuro:  + generalized weakness,  + cognitive impairment Psych: non-anxious affect, A and Oriented to self Thank you for the opportunity to participate in the care of Alexandria Hanson. Please call our office at [phone] if we can be of additional assistance.   Alexandria Gillihan Ihor Gully, NP

## 2022-08-08 ENCOUNTER — Non-Acute Institutional Stay: Payer: Commercial Managed Care - HMO | Admitting: Nurse Practitioner

## 2022-08-08 ENCOUNTER — Encounter: Payer: Self-pay | Admitting: Nurse Practitioner

## 2022-08-08 DIAGNOSIS — R5381 Other malaise: Secondary | ICD-10-CM

## 2022-08-08 DIAGNOSIS — Z515 Encounter for palliative care: Secondary | ICD-10-CM

## 2022-08-08 DIAGNOSIS — I639 Cerebral infarction, unspecified: Secondary | ICD-10-CM

## 2022-08-08 NOTE — Progress Notes (Addendum)
Designer, jewellery Palliative Care Consult Note Telephone: [phone]  Fax: [phone]    Date of encounter: 08/08/22 11:53 AM PATIENT NAME: Alexandria Hanson [address]   [phone] (home)  DOB: [birth year] MRN: [id number] PRIMARY CARE PROVIDER:    Alvester Morin, MD,  [facility]  RESPONSIBLE PARTY:    Contact Information     Name Relation Home Work Mobile   Durene Fruits Daughter   [phone]     I met face to face with patient in facility. Palliative Care was asked to follow this patient by consultation request of  [facility] to address advance care planning and complex medical decision making. This is a follow up visit.                                  ASSESSMENT AND PLAN / RECOMMENDATIONS:  Symptom Management/Plan: 1. Advance Care Planning;  DNR 2. Goals of Care: Goals include to maximize quality of life and symptom management. Our advance care planning conversation included a discussion about:    The value and importance of advance care planning  Exploration of personal, cultural or spiritual beliefs that might influence medical decisions  Exploration of goals of care in the event of a sudden injury or illness  Identification and preparation of a healthcare agent  Review and updating or creation of an advance directive document. 3. Palliative care encounter; Palliative care encounter; Palliative medicine team will continue to support patient, patient's family, and medical team. Visit consisted of counseling and education dealing with the complex and emotionally intense issues of symptom management and palliative care in the setting of serious and potentially life-threatening illness   4. Debility secondary to CVA, encourage mobility, fall precautions, reviewed weights, with 2.8 lb gain; encourage to eat, supplements; reviewed weights, continue to monitor weights, oob as able,  encourage self independence.  05/16/2022 weight 135 lbs 07/04/2022 weight 136.2 lbs 08/04/2022 weight 137.8 lbs Follow up Palliative Care Visit: Palliative care will continue to follow for complex medical decision making, advance care planning, and clarification of goals. Return 4 to 8 weeks or prn.   I spent 45 minutes providing this consultation started at 10:30am. More than 50% of the time in this consultation was spent in counseling and care coordination. PPS: 40% Chief Complaint: Follow up palliative consult for complex medical decision making, address goals, manage ongoing symptoms   HISTORY OF PRESENT ILLNESS:  Alexandria Hanson is a 89 y.o. year old female  with multiple medical problems including CVA, Myocardial infarction, HtN, systolic CHF, ASCVD, speech disturbance, depression, anxiety. Alexandria Hanson resides at [facility], requires assistance with mobility, transfers, adl's including bathing, dressing, wearing a brace to right hand. Alexandria Hanson requires assistance with feeding with fair appetite. Purpose of today PC f/u visit further discussion monitor trends of appetite, weights, monitor for functional, cognitive decline with chronic disease progression, assess any active symptoms, supportive role. At present Alexandria Hanson is lying in bed, makes eye contact. Alexandria Hanson does whispers answers, nods to respond today. Simple questions asked about ros, limited with cognitive/language ability. Support provided, Alexandria Hanson is stable, reviewed weights, encouraged Alexandria Hanson to eat. 2.8 lbs gain. Attempted to contact dtg. Medical goals, medications, poc reviewed. PC f/u visit further discussion monitor trends of appetite, weights, monitor for functional, cognitive decline with chronic disease progression, assess any active symptoms, supportive role.Updated staff. No  new orders today.    History obtained from review of EMR, discussion with primary team, and interview with family, facility staff/caregiver and/or Alexandria.  Alexandria Hanson.  I reviewed available labs, medications, imaging, studies and related documents from the EMR.  Records reviewed and summarized above.  Physical Exam: Constitutional: NAD General: frail appearing, pleasant female ENMT: oral mucous membranes moist CV: S1S2, RRR Pulmonary: LCTA Abdomen: soft and non tender Skin: warm and dry Neuro:  + generalized weakness,  + cognitive impairment Psych: non-anxious affect, A and Oriented to self Thank you for the opportunity to participate in the care of Alexandria. Hanson. Please call our office at [phone] if we can be of additional assistance.   Demontre Padin Ihor Gully, NP

## 2022-09-15 ENCOUNTER — Non-Acute Institutional Stay: Payer: Medicare Other | Admitting: Nurse Practitioner

## 2022-09-15 ENCOUNTER — Encounter: Payer: Self-pay | Admitting: Nurse Practitioner

## 2022-09-15 DIAGNOSIS — I639 Cerebral infarction, unspecified: Secondary | ICD-10-CM

## 2022-09-15 DIAGNOSIS — R5381 Other malaise: Secondary | ICD-10-CM

## 2022-09-15 DIAGNOSIS — Z515 Encounter for palliative care: Secondary | ICD-10-CM

## 2022-09-15 NOTE — Progress Notes (Addendum)
Designer, [facility]ell[facility] Palliative Care Consult Note Telephone: [phone]  Fax: [phone]    Date of encounter: 09/15/22 4:32 PM PATIENT NAME: Alexandria Hanson [place] [address]   [phone] (home)  DOB: September 07, 1983 MRN: [id number] PRIMARY CARE PROVIDER:    Alvester Morin, MD,  [facility]  RESPONSIBLE PARTY:    Contact Information     Name Relation Home Work Mobile   Durene Fruits Daughter   [phone]      I met face to face with patient in facility. Palliative Care was asked to follow this patient by consultation request of  [facility] to address advance care planning and complex medical decision making. This is a follow up visit.                                  ASSESSMENT AND PLAN / RECOMMENDATIONS:  Symptom Management/Plan: 1. Advance Care Planning;  DNR 2. Palliative care encounter; Palliative care encounter; Palliative medicine team will continue to support patient, patient's family, and medical team. Visit consisted of counseling and education dealing with the complex and emotionally intense issues of symptom management and palliative care in the setting of serious and potentially life-threatening illness   3. Debility secondary to CVA, encourage mobility, fall precautions, reviewed weights, encourage to eat, supplements; reviewed weights, continue to monitor weights, oob as able, encourage self independence.  05/16/2022 weight 135 lbs 07/04/2022 weight 136.2 lbs 08/04/2022 weight 137.8 lbs Follow up Palliative Care Visit: Palliative care will continue to follow for complex medical decision making, advance care planning, and clarification of goals. Return 4 to 8 weeks or prn.   I spent 47 minutes providing this consultation. More than 50% of the time in this consultation was spent in counseling and care coordination. PPS: 40% Chief Complaint: Follow up palliative consult for complex medical  decision making, address goals, manage ongoing symptoms   HISTORY OF PRESENT ILLNESS:  Alexandria Hanson is a 89 y.o. year old female  with multiple medical problems including CVA, Myocardial infarction, HtN, systolic CHF, ASCVD, speech disturbance, depression, anxiety. Alexandria Hanson resides at [facility], requires assistance with mobility, transfers, adl's including bathing, dressing, wearing a brace to right hand. Alexandria Hanson requires assistance with feeding with fair appetite. Purpose of today PC f/u visit further discussion monitor trends of appetite, weights, monitor for functional, cognitive decline with chronic disease progression, assess any active symptoms, supportive role. At present Alexandria Hanson is lying in bed, makes eye contact. Alexandria Hanson appears comfortable, no visitors present. I visited and observed Alexandria Hanson, She repeated ok then giggled. Limited interaction with aphasia. Alexandria Hanson was cooperative with assessment, support provided. Medication, poc, goc reviewed. Attempted to contact daughter. Will continue current poc   History obtained from review of EMR, discussion with primary team, and interview with family, facility staff/caregiver and/or Alexandria. Hanson.  I reviewed available labs, medications, imaging, studies and related documents from the EMR.  Records reviewed and summarized above.  Physical Exam: General: frail appearing, debilitated female, chronically ill  ENMT: oral mucous membranes moist CV: S1S2, RRR Pulmonary: anterior  clear Skin: warm and dry Neuro:  + generalized weakness,  + cognitive impairment Psych: A and Oriented to self Thank you for the opportunity to participate in the care of Alexandria. Hanson. Please call our office at [phone] if we can be of additional assistance.   Cledith Kamiya Ihor Gully, NP

## 2022-11-27 ENCOUNTER — Encounter: Payer: Self-pay | Admitting: Nurse Practitioner

## 2022-11-27 ENCOUNTER — Non-Acute Institutional Stay: Payer: Medicare Other | Admitting: Nurse Practitioner

## 2022-11-27 DIAGNOSIS — I639 Cerebral infarction, unspecified: Secondary | ICD-10-CM

## 2022-11-27 DIAGNOSIS — R5381 Other malaise: Secondary | ICD-10-CM

## 2022-11-27 DIAGNOSIS — Z515 Encounter for palliative care: Secondary | ICD-10-CM

## 2022-11-27 NOTE — Progress Notes (Signed)
Therapist, nutritional Palliative Care Consult Note Telephone: [phone]  Fax: [phone]    Date of encounter: 11/27/22 2:46 PM PATIENT NAME: Alexandria Hanson [address] [place] [address]   [phone] (home)  DOB: 1928/07/26 MRN: [id number] PRIMARY CARE PROVIDER:    Keane Police, MD,  [phone] [place] [address] [phone]  REFERRING PROVIDER:   Keane Police, MD [phone] [place],  [place] [address] [phone]  RESPONSIBLE PARTY:    Contact Information     Name Relation Home Work Mobile   Jennette Dubin Daughter   [phone]     I met face to face with patient in facility. Palliative Care was asked to follow this patient by consultation request of  [facility] to address advance care planning and complex medical decision making. This is a follow up visit.                                  ASSESSMENT AND PLAN / RECOMMENDATIONS:  Symptom Management/Plan: 1. Advance Care Planning;  DNR 2. Palliative care encounter; Palliative care encounter; Palliative medicine team will continue to support patient, patient's family, and medical team. Visit consisted of counseling and education dealing with the complex and emotionally intense issues of symptom management and palliative care in the setting of serious and potentially life-threatening illness   3. Debility secondary to CVA, encourage mobility, fall precautions, reviewed weights, encourage to eat, supplements; reviewed weights, continue to monitor weights, oob as able, encourage self independence. Praised Alexandria Hanson for weight gain 05/16/2022 weight 135 lbs 07/04/2022 weight 136.2 lbs 08/04/2022 weight 137.8 lbs 11/03/2022 weight 139.6 lbs Weight gain Follow up Palliative Care Visit: Palliative care will continue to follow for complex medical decision making, advance care planning, and clarification of goals. Return 4 to 8  weeks or prn.   I spent 45 minutes providing this consultation. More than 50% of the time in this consultation was spent in counseling and care coordination. PPS: 40% Chief Complaint: Follow up palliative consult for complex medical decision making, address goals, manage ongoing symptoms   HISTORY OF PRESENT ILLNESS:  Alexandria Hanson is a 89 y.o. year old female year old female  with multiple medical problems including CVA, Myocardial infarction, HtN, systolic CHF, ASCVD, speech disturbance, depression, anxiety. Alexandria Hanson resides at [facility], requires assistance with mobility, transfers, adl's including bathing, dressing, wearing a brace to right hand. Alexandria Hanson requires assistance with feeding with fair appetite. Purpose of today PC f/u visit further discussion monitor trends of appetite, weights, monitor for functional, cognitive decline with chronic disease progression, assess any active symptoms, supportive role. At present Alexandria Hanson is lying in bed, makes eye contact. Alexandria Hanson appears comfortable, no visitors present. Alexandria Hanson does attempt to whisper words, endorses she does not feel well, though Limited interaction with aphasia. Staff endorses no recent changes or concerns. No recent falls, wounds, infections, hospitalizations. Alexandria Hanson was cooperative with assessment, support provided. Medication, poc, goc reviewed. Attempted to contact daughter. PC f/u visit further discussion monitor trends of appetite, weights, monitor for functional, cognitive decline with chronic disease progression, assess any active symptoms, supportive role.   History obtained from review of EMR, discussion with primary team, and interview with family, facility staff/caregiver and/or Alexandria. Hanson.  I reviewed available labs, medications, imaging, studies and related documents from the EMR.  Records reviewed and summarized above.  Physical Exam: General:  frail appearing, debilitated female, chronically ill  ENMT: oral mucous membranes  moist CV: S1S2, RRR Pulmonary: anterior  clear, decreased bases Skin: warm and dry Neuro:  + generalized weakness,  + cognitive impairment Psych: A and Oriented to self  Thank you for the opportunity to participate in the care of Alexandria. Hanson. Please call our office at [phone] if we can be of additional assistance.   Kamie Korber Prince [place], NP
